# Patient Record
Sex: Female | Born: 1965 | Race: White | Hispanic: No | Marital: Married | Smoking: Never smoker
Health system: Southern US, Community
[De-identification: ages and names within clinical notes are randomized; demographics above are authoritative.]

## PROBLEM LIST (undated history)

## (undated) DIAGNOSIS — H9319 Tinnitus, unspecified ear: Secondary | ICD-10-CM

## (undated) DIAGNOSIS — H9190 Unspecified hearing loss, unspecified ear: Secondary | ICD-10-CM

## (undated) DIAGNOSIS — R519 Headache, unspecified: Secondary | ICD-10-CM

## (undated) DIAGNOSIS — C801 Malignant (primary) neoplasm, unspecified: Secondary | ICD-10-CM

## (undated) DIAGNOSIS — E119 Type 2 diabetes mellitus without complications: Secondary | ICD-10-CM

## (undated) DIAGNOSIS — T7840XA Allergy, unspecified, initial encounter: Secondary | ICD-10-CM

## (undated) DIAGNOSIS — H8109 Meniere's disease, unspecified ear: Secondary | ICD-10-CM

## (undated) DIAGNOSIS — E669 Obesity, unspecified: Secondary | ICD-10-CM

## (undated) DIAGNOSIS — K589 Irritable bowel syndrome without diarrhea: Secondary | ICD-10-CM

## (undated) DIAGNOSIS — N816 Rectocele: Secondary | ICD-10-CM

## (undated) DIAGNOSIS — E785 Hyperlipidemia, unspecified: Secondary | ICD-10-CM

## (undated) HISTORY — DX: Obesity, unspecified: E66.9

## (undated) HISTORY — DX: Hyperlipidemia, unspecified: E78.5

## (undated) HISTORY — PX: LAPAROSCOPY FOR ECTOPIC PREGNANCY: SUR765

## (undated) HISTORY — DX: Allergy, unspecified, initial encounter: T78.40XA

## (undated) HISTORY — PX: OTHER SURGICAL HISTORY: SHX169

## (undated) HISTORY — DX: Malignant (primary) neoplasm, unspecified: C80.1

## (undated) HISTORY — DX: Type 2 diabetes mellitus without complications: E11.9

---

## 2004-04-22 ENCOUNTER — Other Ambulatory Visit: Admission: RE | Admit: 2004-04-22 | Discharge: 2004-04-22 | Payer: Self-pay | Admitting: Obstetrics and Gynecology

## 2005-05-25 ENCOUNTER — Observation Stay (HOSPITAL_COMMUNITY): Admission: AD | Admit: 2005-05-25 | Discharge: 2005-05-26 | Payer: Self-pay | Admitting: Obstetrics & Gynecology

## 2005-05-25 ENCOUNTER — Encounter (INDEPENDENT_AMBULATORY_CARE_PROVIDER_SITE_OTHER): Payer: Self-pay | Admitting: Specialist

## 2005-06-15 ENCOUNTER — Inpatient Hospital Stay (HOSPITAL_COMMUNITY): Admission: AD | Admit: 2005-06-15 | Discharge: 2005-06-15 | Payer: Self-pay | Admitting: Obstetrics and Gynecology

## 2005-06-18 ENCOUNTER — Inpatient Hospital Stay (HOSPITAL_COMMUNITY): Admission: AD | Admit: 2005-06-18 | Discharge: 2005-06-18 | Payer: Self-pay | Admitting: Obstetrics and Gynecology

## 2005-06-21 ENCOUNTER — Inpatient Hospital Stay (HOSPITAL_COMMUNITY): Admission: AD | Admit: 2005-06-21 | Discharge: 2005-06-21 | Payer: Self-pay | Admitting: Obstetrics & Gynecology

## 2005-06-28 ENCOUNTER — Inpatient Hospital Stay (HOSPITAL_COMMUNITY): Admission: AD | Admit: 2005-06-28 | Discharge: 2005-06-28 | Payer: Self-pay | Admitting: Obstetrics & Gynecology

## 2005-06-30 ENCOUNTER — Other Ambulatory Visit: Admission: RE | Admit: 2005-06-30 | Discharge: 2005-06-30 | Payer: Self-pay | Admitting: Obstetrics and Gynecology

## 2005-07-05 ENCOUNTER — Inpatient Hospital Stay (HOSPITAL_COMMUNITY): Admission: AD | Admit: 2005-07-05 | Discharge: 2005-07-05 | Payer: Self-pay | Admitting: Obstetrics & Gynecology

## 2008-05-24 ENCOUNTER — Inpatient Hospital Stay (HOSPITAL_COMMUNITY): Admission: AD | Admit: 2008-05-24 | Discharge: 2008-05-24 | Payer: Self-pay | Admitting: Obstetrics and Gynecology

## 2008-06-09 ENCOUNTER — Other Ambulatory Visit: Admission: RE | Admit: 2008-06-09 | Discharge: 2008-06-09 | Payer: Self-pay | Admitting: Family Medicine

## 2009-12-30 ENCOUNTER — Encounter: Admission: RE | Admit: 2009-12-30 | Discharge: 2009-12-30 | Payer: Self-pay | Admitting: Family Medicine

## 2010-08-09 LAB — CBC
HCT: 39.9 % (ref 36.0–46.0)
WBC: 7.1 10*3/uL (ref 4.0–10.5)

## 2010-08-09 LAB — URINALYSIS, ROUTINE W REFLEX MICROSCOPIC
Leukocytes, UA: NEGATIVE
Nitrite: NEGATIVE
Specific Gravity, Urine: 1.02 (ref 1.005–1.030)
pH: 6 (ref 5.0–8.0)

## 2010-08-09 LAB — URINE MICROSCOPIC-ADD ON

## 2010-08-09 LAB — COMPREHENSIVE METABOLIC PANEL
ALT: 13 U/L (ref 0–35)
Albumin: 4 g/dL (ref 3.5–5.2)
Alkaline Phosphatase: 55 U/L (ref 39–117)
CO2: 28 mEq/L (ref 19–32)
Calcium: 8.8 mg/dL (ref 8.4–10.5)
Chloride: 108 mEq/L (ref 96–112)
GFR calc Af Amer: >60 mL/min (ref >60)
GFR calc non Af Amer: >60 mL/min (ref >60)
Potassium: 3.7 mEq/L (ref 3.5–5.1)
Sodium: 139 mEq/L (ref 135–145)
Total Bilirubin: 0.5 mg/dL (ref 0.3–1.2)

## 2010-08-09 LAB — DIFFERENTIAL
Eosinophils Relative: 1 % (ref 0–5)
Lymphocytes Relative: 25 % (ref 12–46)
Monocytes Absolute: 0.4 10*3/uL (ref 0.1–1.0)
Neutro Abs: 4.8 10*3/uL (ref 1.7–7.7)

## 2010-08-09 LAB — POCT PREGNANCY, URINE: Preg Test, Ur: NEGATIVE

## 2010-09-10 NOTE — Op Note (Signed)
NAME:  Sara Briggs, Sara Briggs                 ACCOUNT NO.:  [id number]   MEDICAL RECORD NO.:  [id number]          PATIENT TYPE:  AMB   LOCATION:  [facility]                          FACILITY:  [facility]   PHYSICIAN:  Sara Briggs, M.D.   DATE OF BIRTH:  [birth year]   DATE OF PROCEDURE:  DATE OF DISCHARGE:                                 OPERATIVE REPORT   PREOPERATIVE DIAGNOSIS:  Suspected ruptured ectopic pregnancy.   POSTOPERATIVE DIAGNOSES:  1.  Suspected ruptured ectopic pregnancy.  2.. Hemorrhagic right corpus luteum cyst.   PROCEDURE:  1.  Laparoscopic drainage of hemoperitoneum.  2.  Right ovarian cystectomy.   SURGEON:  Sara Briggs, M.D.   ASSISTANT:  Sara Briggs. Sara Briggs, M.D.   ANESTHESIA:  General endotracheal, local with 0.25% Marcaine.   IV FLUIDS:  2375 mL Ringer's lactate.   ESTIMATED BLOOD LOSS:  500 mL  of hemoperitoneum.   URINE OUTPUT:  150 mL.   COMPLICATIONS:  None.   INDICATIONS FOR PROCEDURE:  The patient is a 45 year old gravida 3, para 2-0-  0-2 Caucasian female with last menstrual period April 30, 2005, status post  bilateral tubal ligation, who presented to her primary care Sara Briggs this  morning with severe right lower quadrant pain which radiated to her left  lower quadrant and upper abdomen.  The patient was seen and evaluated and  noted to have a hemoglobin of approximately 14.  There was a suspicion for  appendicitis, and the patient was sent to [facility] for an  abdominal CT scan which documented a normal appendix but no blood within the  peritoneal cavity which was attributed to a ruptured ovarian cyst.  The  patient presented back for followup at her primary care Sara Briggs's office  where she then at that time went on to have a quantitative beta HCG  performed to rule out an ectopic pregnancy.  The patient was noted to be  stable at this time and was sent home with a prescription for pain  medication.  The patient's beta HCG then returned at  2472, and the patient  was recalled to go to the [facility] immediately for evaluation and  treatment of a probable ruptured ectopic pregnancy.  The patient was  reporting continued pain in the abdomen and some dizziness from earlier that  day.  She declined any future childbearing.  The abdomen was noted to be  tender with guarding.  There was no evidence of rebound.  An immediate  transabdominal and transvaginal ultrasound was performed by radiology at the  patient's bedside in the maternity admissions area.  There was no evidence  of an intrauterine pregnancy.  There was hemoperitoneum which filled the  pelvis and extended to the diaphragm.  The patient's IV was started, blood  was drawn, and she was immediately consented for a laparoscopy with removal  of the ectopic pregnancy and possible bilateral salpingectomy.  After risks,  benefits, and alternatives were discussed with her.   At the time of laparoscopy, there was 500 mL of hemoperitoneum appreciated.  There was active bleeding noted from the  right adnexal region which appeared  to be mostly coming from the right ovary.  There is an extensive amount of  clot which was covering the right ovary and the right fallopian tube and  some of the clot appeared to be densely adherent to the right corpus luteum  cyst on this side which measured approximately 2 cm in diameter. There was  some clot which was also adherent to the end of the right fallopian tube.  The right fallopian tube appeared to have had a previous tubal ligation  performed very proximally although it could not be obvious if there was  complete disruption of the tube on this side.  The fallopian tube itself had  no distortion in its shape or size.  The left ovary and the left fallopian  tube demonstrated no evidence of ectopic pregnancy.  There was clear  interruption off the left fallopian tube consistent with prior tubal  ligation.  There was no evidence of any  adhesive disease in the abdomen nor  in the pelvis. The liver appeared to be normal.  The uterus was  unremarkable.   SPECIMENS:  The hemoperitoneum with suspected ectopic tissue was all sent to  pathology together. This was sent separately from the right ovarian corpus  luteum cyst.   DESCRIPTION OF PROCEDURE:  The patient was taken from the maternity  admissions area down to the operating room where general endotracheal  anesthesia was induced.  The patient was then placed in the dorsal lithotomy  position, and the abdomen and chest were sterilely prepped and draped. A  Foley catheter was placed inside the bladder.  A single-tooth tenaculum was  placed on the anterior cervical lip, and this was replaced with a Hulka  tenaculum.   The laparoscopy began by creating a 1 cm umbilical incision with a scalpel.  A 10 mm trocar was inserted directly into the peritoneal cavity.  The  laparoscope confirmed proper placement, and a pneumoperitoneum was achieved.  The patient was then placed in the Trendelenburg position.  A 10 mm right  lower quadrant incision was created with the scalpel and a 10/11 mm trocar  was placed under direct visualization of the laparoscope. The large 10 mm  Nezhat suction irrigator was then used to remove the hemoperitoneum from the  abdominal cavity.  A 5 mm incision was created in the left lower quadrant  and a 5 mm trocar was placed under direct visualization of laparoscope.  A  grasper was introduced to assist in evaluation of the right adnexal region.  The findings were as noted above.  There was active bleeding noted from the  right ovarian cyst and adherent clot to and possible ectopic tissue, and  this was extracted from the right ovary. The edges of the ovary were  cauterized.  Hemostasis was noted to be excellent.   The pelvis was irrigated and suctioned one final time, and the operative site was noted to be hemostatic.  The procedure was finished at this  time.  The left lower quadrant trocar was removed as was the right lower quadrant  trocar.  The abdominal wall sites were hemostatic.  The pneumoperitoneum was  then released, and the umbilical trocar and laparoscope were removed  simultaneously.   The right lower quadrant fascial incision was closed with figure-of-eight  sutures of 0  Vicryl.  All skin incisions were closed with subcuticular  sutures of 3-0 plain.  The incisions were injected locally with 0.25%  Marcaine.  Sterile  bandages were placed over the incisions.   The patient was cleansed of any remaining Betadine.  The vaginal instruments  and the Foley were removed.  The patient was awakened and extubated and  escorted to the recovery room in stable and awake condition.  There were no  complications to the procedure.  All needle, instrument and , sponge counts  were correct.      Sara Briggs, M.D.  Electronically Signed     BES/MEDQ  D:  05/25/2005  T:  05/25/2005  Job:  [id number]

## 2011-05-18 ENCOUNTER — Other Ambulatory Visit (HOSPITAL_COMMUNITY)
Admission: RE | Admit: 2011-05-18 | Discharge: 2011-05-18 | Disposition: A | Discharge status: Home / Self Care | Payer: 59 | Source: Ambulatory Visit | Attending: Family Medicine | Admitting: Family Medicine

## 2011-05-18 ENCOUNTER — Other Ambulatory Visit: Payer: Self-pay | Admitting: Family Medicine

## 2011-05-18 DIAGNOSIS — Z0000 Encounter for general adult medical examination without abnormal findings: Secondary | ICD-10-CM | POA: Insufficient documentation

## 2011-06-01 ENCOUNTER — Other Ambulatory Visit: Payer: Self-pay | Admitting: Obstetrics and Gynecology

## 2011-06-06 ENCOUNTER — Emergency Department (INDEPENDENT_AMBULATORY_CARE_PROVIDER_SITE_OTHER)
Admission: EM | Admit: 2011-06-06 | Discharge: 2011-06-06 | Disposition: A | Discharge status: Other Acute Inpatient Hospital | Payer: 59 | Source: Home / Self Care | Attending: Family Medicine | Admitting: Family Medicine

## 2011-06-06 ENCOUNTER — Encounter (HOSPITAL_COMMUNITY): Payer: Self-pay | Admitting: *Deleted

## 2011-06-06 DIAGNOSIS — S61011A Laceration without foreign body of right thumb without damage to nail, initial encounter: Secondary | ICD-10-CM

## 2011-06-06 DIAGNOSIS — S61209A Unspecified open wound of unspecified finger without damage to nail, initial encounter: Secondary | ICD-10-CM

## 2011-06-06 HISTORY — DX: Allergy, unspecified, initial encounter: T78.40XA

## 2011-06-06 NOTE — ED Notes (Signed)
Lac   To   r  Thumb      Sustained  While  Opening      A  Can of  Dog  Food      This  Am      Bleeding  Subsided

## 2011-06-06 NOTE — ED Notes (Signed)
tdap   10/2004

## 2011-06-10 NOTE — ED Provider Notes (Signed)
History     CSN: [id number]  Arrival date & time 06/06/11  1453   First MD Initiated Contact with Patient 06/06/11 1632      Chief Complaint  Patient presents with  . Laceration    (Consider location/radiation/quality/duration/timing/severity/associated sxs/prior treatment) HPI Comments: 46 y/o right handed female h/o allergic rhinitis here c/o cut in right thumb just prior arrival was opening a can of dog food accidentally cutting her thumb. Able to move thumb with no difficulty, bleeding has self resolved. Pt works as Engineer, civil (consulting), tetanus vaccine was found to be up to date.    Past Medical History  Diagnosis Date  . Allergic     Past Surgical History  Procedure Date  . Btl   . Laparoscopy for ectopic pregnancy     Family History  Problem Relation Age of Onset  . Diabetes Mother   . Hypertension Mother   . Hypertension Father     History  Substance Use Topics  . Smoking status: Not on file  . Smokeless tobacco: Not on file  . Alcohol Use:     OB History    Grav Para Term Preterm Abortions TAB SAB Ect Mult Living                  Review of Systems  All other systems reviewed and are negative.    Allergies  Percocet; Sulfa antibiotics; and Vicodin  Home Medications   Current Outpatient Rx  Name Route Sig Dispense Refill  . CETIRIZINE HCL 10 MG PO TABS Oral Take 10 mg by mouth daily.    Marland Kitchen FLUTICASONE PROPIONATE 50 MCG/ACT NA SUSP Nasal Place 2 sprays into the nose daily.      BP 114/81  Pulse 84  Temp(Src) 97.8 F (36.6 C) (Oral)  Resp 14  SpO2 97%  Physical Exam  Nursing note and vitals reviewed. Constitutional: She is oriented to person, place, and time. She appears well-developed and well-nourished. No distress.  Musculoskeletal:       Right hand: normal ROM of digits including 1st digit with normal active and passive flexion and extension of DIPJ and PIPJ. Rest of hand exam normal.  Complete right hand neurovascularly intact.   Neurological:  She is alert and oriented to person, place, and time.  Skin:       Vertical superficial linear clean hemostatic laceration at middle of 1st right distal phalange palmar side. No swelling or erythema.     ED Course  LACERATION REPAIR Performed by: Sharin Grave Authorized by: Sharin Grave Consent: Verbal consent obtained. Consent given by: patient Patient understanding: patient states understanding of the procedure being performed Location: right thumb. Tendon involvement: none Irrigation method: cleaning solution. Amount of cleaning: standard Degree of undermining: none Skin closure: glue Approximation: close Approximation difficulty: simple Dressing: no adhesive with dry dressing on top. Patient tolerance: Patient tolerated the procedure well with no immediate complications.   (including critical care time)  Labs Reviewed - No data to display No results found.   1. Laceration of right thumb       MDM  Superficial clean, did nor require sutures; repaired with derma bond. Regular wound care.  Although TD up todate Reccommended to get TDaP at occupational health.        Sharin Grave, MD 06/10/11 [phone]

## 2011-06-22 ENCOUNTER — Encounter: Payer: Self-pay | Admitting: Internal Medicine

## 2011-07-27 ENCOUNTER — Ambulatory Visit (AMBULATORY_SURGERY_CENTER): Payer: 59

## 2011-07-27 VITALS — Ht 63.0 in | Wt 185.0 lb

## 2011-07-27 DIAGNOSIS — Z800 Family history of malignant neoplasm of digestive organs: Secondary | ICD-10-CM

## 2011-07-27 DIAGNOSIS — Z1211 Encounter for screening for malignant neoplasm of colon: Secondary | ICD-10-CM

## 2011-07-27 MED ORDER — PEG-KCL-NACL-NASULF-NA ASC-C 100 G PO SOLR: 1.0000 | Freq: Once | ORAL | Status: DC

## 2011-08-10 ENCOUNTER — Telehealth: Payer: Self-pay

## 2011-08-10 ENCOUNTER — Encounter: Payer: 59 | Admitting: Internal Medicine

## 2011-08-10 NOTE — Telephone Encounter (Signed)
She should continue the prep but can wait an hour before resuming

## 2011-08-10 NOTE — Telephone Encounter (Signed)
Pt states she had some diarrhea today, has hx of IBS. Started her colon prep today a little early trying to get it in. Pt states she is about half way through the 1st dose of moviprep but she is getting nauseated. States her bowel movements are already clear. Pt wants to know if she can stop the prep. Colon scheduled for 08/11/11@9am . Dr. Arlyce Dice please advise.

## 2011-08-11 ENCOUNTER — Ambulatory Visit (AMBULATORY_SURGERY_CENTER): Payer: 59 | Admitting: Gastroenterology

## 2011-08-11 ENCOUNTER — Encounter: Payer: Self-pay | Admitting: Gastroenterology

## 2011-08-11 VITALS — BP 107/70 | HR 57 | Temp 96.8°F | Resp 18 | Ht 63.0 in | Wt 185.0 lb

## 2011-08-11 DIAGNOSIS — Z1211 Encounter for screening for malignant neoplasm of colon: Secondary | ICD-10-CM

## 2011-08-11 DIAGNOSIS — Z800 Family history of malignant neoplasm of digestive organs: Secondary | ICD-10-CM

## 2011-08-11 MED ORDER — SODIUM CHLORIDE 0.9 % IV SOLN: 500.0000 mL | INTRAVENOUS | Status: DC

## 2011-08-11 NOTE — Progress Notes (Signed)
Patient did not experience any of the following events: a burn prior to discharge; a fall within the facility; wrong site/side/patient/procedure/implant event; or a hospital transfer or hospital admission upon discharge from the facility. (G8907) Patient did not have preoperative order for IV antibiotic SSI prophylaxis. (G8918)  

## 2011-08-11 NOTE — Telephone Encounter (Signed)
Pt was able to complete her prep with no further problems.

## 2011-08-11 NOTE — Op Note (Signed)
[facility] [address] [place], [place]  [address]  COLONOSCOPY PROCEDURE REPORT  PATIENT:  Sara Briggs, Sara Briggs  MR#:  [id number] BIRTHDATE:  1965-09-28, 45 yrs. old  GENDER:  female ENDOSCOPIST:  Barbette Hair. Arlyce Dice, MD REF. BY:  Laurann Montana, M.D. PROCEDURE DATE:  08/11/2011 PROCEDURE:  Diagnostic Colonoscopy ASA CLASS:  Class I INDICATIONS:  Routine Risk Screening, family history of colon cancer Grandfather MEDICATIONS:   MAC sedation, administered by CRNA propofol 300mg IV  DESCRIPTION OF PROCEDURE:   After the risks benefits and alternatives of the procedure were thoroughly explained, informed consent was obtained.  Digital rectal exam was performed and revealed no abnormalities.   The LB PCF-H180AL [id number] endoscope was introduced through the anus and advanced to the cecum, which was identified by both the appendix and ileocecal valve, without limitations.  The quality of the prep was excellent, using MoviPrep.  The instrument was then slowly withdrawn as the colon was fully examined. <<PROCEDUREIMAGES>>  FINDINGS:  A normal appearing cecum, ileocecal valve, and appendiceal orifice were identified. The ascending, hepatic flexure, transverse, splenic flexure, descending, sigmoid colon, and rectum appeared unremarkable (see image2, image3, and image4). Retroflexed views in the rectum revealed no abnormalities.    The time to cecum =  1) 9.0  minutes. The scope was then withdrawn in 1) 7.0  minutes from the cecum and the procedure completed. COMPLICATIONS:  None ENDOSCOPIC IMPRESSION: 1) Normal colon RECOMMENDATIONS: 1) OP follow-up is advised on a PRN basis. REPEAT EXAM:  In 10 year(s) for Colonoscopy.  ______________________________ Barbette Hair. Arlyce Dice, MD  CC:  n. eSIGNED:   Barbette Hair. Kayron Hicklin at 08/11/2011 09:29 AM  Maudie Flakes, [phone]

## 2011-08-11 NOTE — Patient Instructions (Signed)
Discharge instructions given with verbal understanding. Normal exam. Resume previous medications. YOU HAD AN ENDOSCOPIC PROCEDURE TODAY AT THE [facility]: Refer to the procedure report that was given to you for any specific questions about what was found during the examination.  If the procedure report does not answer your questions, please call your gastroenterologist to clarify.  If you requested that your care partner not be given the details of your procedure findings, then the procedure report has been included in a sealed envelope for you to review at your convenience later.  YOU SHOULD EXPECT: Some feelings of bloating in the abdomen. Passage of more gas than usual.  Walking can help get rid of the air that was put into your GI tract during the procedure and reduce the bloating. If you had a lower endoscopy (such as a colonoscopy or flexible sigmoidoscopy) you may notice spotting of blood in your stool or on the toilet paper. If you underwent a bowel prep for your procedure, then you may not have a normal bowel movement for a few days.  DIET: Your first meal following the procedure should be a light meal and then it is ok to progress to your normal diet.  A half-sandwich or bowl of soup is an example of a good first meal.  Heavy or fried foods are harder to digest and may make you feel nauseous or bloated.  Likewise meals heavy in dairy and vegetables can cause extra gas to form and this can also increase the bloating.  Drink plenty of fluids but you should avoid alcoholic beverages for 24 hours.  ACTIVITY: Your care partner should take you home directly after the procedure.  You should plan to take it easy, moving slowly for the rest of the day.  You can resume normal activity the day after the procedure however you should NOT DRIVE or use heavy machinery for 24 hours (because of the sedation medicines used during the test).    SYMPTOMS TO REPORT IMMEDIATELY: A gastroenterologist  can be reached at any hour.  During normal business hours, 8:30 AM to 5:00 PM Monday through Friday, call [phone].  After hours and on weekends, please call the GI answering service at [phone] who will take a message and have the physician on call contact you.   Following lower endoscopy (colonoscopy or flexible sigmoidoscopy):  Excessive amounts of blood in the stool  Significant tenderness or worsening of abdominal pains  Swelling of the abdomen that is new, acute  Fever of 100F or higher  FOLLOW UP: If any biopsies were taken you will be contacted by phone or by letter within the next 1-3 weeks.  Call your gastroenterologist if you have not heard about the biopsies in 3 weeks.  Our staff will call the home number listed on your records the next business day following your procedure to check on you and address any questions or concerns that you may have at that time regarding the information given to you following your procedure. This is a courtesy call and so if there is no answer at the home number and we have not heard from you through the emergency physician on call, we will assume that you have returned to your regular daily activities without incident.  SIGNATURES/CONFIDENTIALITY: You and/or your care partner have signed paperwork which will be entered into your electronic medical record.  These signatures attest to the fact that that the information above on your After Visit Summary has been reviewed   and is understood.  Full responsibility of the confidentiality of this discharge information lies with you and/or your care-partner. 

## 2011-08-12 ENCOUNTER — Telehealth: Payer: Self-pay

## 2011-08-12 NOTE — Telephone Encounter (Signed)
  Follow up Call-  Call back number 08/11/2011  Post procedure Call Back phone  # [phone]  Permission to leave phone message Yes     Patient questions:  Do you have a fever, pain , or abdominal swelling? no Pain Score  0 *  Have you tolerated food without any problems? yes  Have you been able to return to your normal activities? yes  Do you have any questions about your discharge instructions: Diet   no Medications  no Follow up visit  no  Do you have questions or concerns about your Care? no  Actions: * If pain score is 4 or above: No action needed, pain <4.

## 2012-07-20 ENCOUNTER — Other Ambulatory Visit: Payer: Self-pay

## 2012-07-20 DIAGNOSIS — Z1231 Encounter for screening mammogram for malignant neoplasm of breast: Secondary | ICD-10-CM

## 2012-08-29 ENCOUNTER — Ambulatory Visit
Admission: RE | Admit: 2012-08-29 | Discharge: 2012-08-29 | Disposition: A | Discharge status: Home / Self Care | Payer: 59 | Source: Ambulatory Visit

## 2012-08-29 DIAGNOSIS — Z1231 Encounter for screening mammogram for malignant neoplasm of breast: Secondary | ICD-10-CM

## 2012-10-15 ENCOUNTER — Emergency Department (HOSPITAL_COMMUNITY)
Admission: EM | Admit: 2012-10-15 | Discharge: 2012-10-15 | Disposition: A | Discharge status: Home / Self Care | Payer: 59 | Attending: Emergency Medicine | Admitting: Emergency Medicine

## 2012-10-15 ENCOUNTER — Encounter (HOSPITAL_COMMUNITY): Payer: Self-pay | Admitting: *Deleted

## 2012-10-15 ENCOUNTER — Emergency Department (HOSPITAL_COMMUNITY): Payer: 59

## 2012-10-15 DIAGNOSIS — IMO0002 Reserved for concepts with insufficient information to code with codable children: Secondary | ICD-10-CM | POA: Insufficient documentation

## 2012-10-15 DIAGNOSIS — Z8719 Personal history of other diseases of the digestive system: Secondary | ICD-10-CM | POA: Insufficient documentation

## 2012-10-15 DIAGNOSIS — Z79899 Other long term (current) drug therapy: Secondary | ICD-10-CM | POA: Insufficient documentation

## 2012-10-15 DIAGNOSIS — Z3202 Encounter for pregnancy test, result negative: Secondary | ICD-10-CM | POA: Insufficient documentation

## 2012-10-15 DIAGNOSIS — R109 Unspecified abdominal pain: Secondary | ICD-10-CM | POA: Insufficient documentation

## 2012-10-15 HISTORY — DX: Rectocele: N81.6

## 2012-10-15 HISTORY — DX: Irritable bowel syndrome, unspecified: K58.9

## 2012-10-15 LAB — CBC WITH DIFFERENTIAL/PLATELET
Eosinophils Absolute: 0.2 10*3/uL (ref 0.0–0.7)
Lymphocytes Relative: 33 % (ref 12–46)
Lymphs Abs: 2.6 10*3/uL (ref 0.7–4.0)
Neutrophils Relative %: 57 % (ref 43–77)
Platelets: 274 10*3/uL (ref 150–400)
RBC: 4.12 MIL/uL (ref 3.87–5.11)
WBC: 7.9 10*3/uL (ref 4.0–10.5)

## 2012-10-15 LAB — URINALYSIS, ROUTINE W REFLEX MICROSCOPIC
Bilirubin Urine: NEGATIVE
Glucose, UA: NEGATIVE mg/dL
Hgb urine dipstick: NEGATIVE
Ketones, ur: NEGATIVE mg/dL
Leukocytes, UA: NEGATIVE
Nitrite: NEGATIVE
Protein, ur: NEGATIVE mg/dL
Specific Gravity, Urine: 1.01 (ref 1.005–1.030)
Urobilinogen, UA: 0.2 mg/dL (ref 0.0–1.0)
pH: 6 (ref 5.0–8.0)

## 2012-10-15 LAB — COMPREHENSIVE METABOLIC PANEL
ALT: 15 U/L (ref 0–35)
AST: 18 U/L (ref 0–37)
Alkaline Phosphatase: 71 U/L (ref 39–117)
CO2: 26 mEq/L (ref 19–32)
GFR calc Af Amer: >90 mL/min (ref >90)
Glucose, Bld: 111 mg/dL — ABNORMAL HIGH (ref 70–99)
Potassium: 3.8 mEq/L (ref 3.5–5.1)
Sodium: 138 mEq/L (ref 135–145)
Total Protein: 7.3 g/dL (ref 6.0–8.3)

## 2012-10-15 MED ORDER — ONDANSETRON 4 MG PO TBDP: ORAL_TABLET | ORAL | Status: AC

## 2012-10-15 MED ORDER — SODIUM CHLORIDE 0.9 % IV SOLN
Freq: Once | INTRAVENOUS | Status: AC
  Administered 2012-10-15: 03:00:00 via INTRAVENOUS

## 2012-10-15 MED ORDER — TRAMADOL HCL 50 MG PO TABS: 50.0000 mg | ORAL_TABLET | Freq: Four times a day (QID) | ORAL | Status: DC | PRN

## 2012-10-15 MED ORDER — ONDANSETRON HCL 4 MG/2ML IJ SOLN
4.0000 mg | Freq: Once | INTRAMUSCULAR | Status: AC
  Administered 2012-10-15: 4 mg via INTRAVENOUS
  Filled 2012-10-15: qty 2

## 2012-10-15 MED ORDER — PROMETHAZINE HCL 25 MG PO TABS: 25.0000 mg | ORAL_TABLET | Freq: Four times a day (QID) | ORAL | Status: DC | PRN

## 2012-10-15 MED ORDER — IOHEXOL 300 MG/ML  SOLN
100.0000 mL | Freq: Once | INTRAMUSCULAR | Status: AC | PRN
  Administered 2012-10-15: 100 mL via INTRAVENOUS

## 2012-10-15 MED ORDER — HYDROMORPHONE HCL PF 1 MG/ML IJ SOLN
0.5000 mg | Freq: Once | INTRAMUSCULAR | Status: AC
  Administered 2012-10-15: 0.5 mg via INTRAVENOUS
  Filled 2012-10-15: qty 1

## 2012-10-15 MED ORDER — HYDROCODONE-ACETAMINOPHEN 5-325 MG PO TABS: 1.0000 | ORAL_TABLET | Freq: Four times a day (QID) | ORAL | Status: DC | PRN

## 2012-10-15 MED ORDER — OXYCODONE-ACETAMINOPHEN 5-325 MG PO TABS: 2.0000 | ORAL_TABLET | Freq: Four times a day (QID) | ORAL | Status: DC | PRN

## 2012-10-15 MED ORDER — IOHEXOL 300 MG/ML  SOLN
50.0000 mL | Freq: Once | INTRAMUSCULAR | Status: AC | PRN
  Administered 2012-10-15: 50 mL via ORAL

## 2012-10-15 NOTE — ED Provider Notes (Signed)
History     CSN: [id number]  Arrival date & time 10/15/12  0139   First MD Initiated Contact with Patient 10/15/12 0243      Chief Complaint  Patient presents with  . Flank Pain    (Consider location/radiation/quality/duration/timing/severity/associated sxs/prior treatment) Patient is a 47 y.o. female presenting with flank pain. The history is provided by the patient (pt complains of abd pain). No language interpreter was used.  Flank Pain This is a new problem. The current episode started 12 to 24 hours ago. The problem occurs constantly. The problem has not changed since onset.Associated symptoms include abdominal pain. Pertinent negatives include no chest pain and no headaches. Nothing aggravates the symptoms. Nothing relieves the symptoms.    Past Medical History  Diagnosis Date  . Allergic   . Rectocele   . IBS (irritable bowel syndrome)     Past Surgical History  Procedure Laterality Date  . Btl    . Laparoscopy for ectopic pregnancy      Family History  Problem Relation Age of Onset  . Diabetes Mother   . Hypertension Mother   . Hypertension Father   . Colon cancer Maternal Uncle   . Colon cancer Maternal Grandfather     History  Substance Use Topics  . Smoking status: Never Smoker   . Smokeless tobacco: Never Used  . Alcohol Use: No    OB History   Grav Para Term Preterm Abortions TAB SAB Ect Mult Living                  Review of Systems  Constitutional: Negative for appetite change and fatigue.  HENT: Negative for congestion, sinus pressure and ear discharge.   Eyes: Negative for discharge.  Respiratory: Negative for cough.   Cardiovascular: Negative for chest pain.  Gastrointestinal: Positive for abdominal pain. Negative for diarrhea.  Genitourinary: Positive for flank pain. Negative for frequency and hematuria.  Musculoskeletal: Negative for back pain.  Skin: Negative for rash.  Neurological: Negative for seizures and headaches.   Psychiatric/Behavioral: Negative for hallucinations.    Allergies  Apple; Tape; Percocet; Sulfa antibiotics; and Vicodin  Home Medications   Current Outpatient Rx  Name  Route  Sig  Dispense  Refill  . acetaminophen (TYLENOL) 500 MG tablet   Oral   Take 500 mg by mouth every 6 (six) hours as needed for pain.         . cetirizine (ZYRTEC) 10 MG tablet   Oral   Take 10 mg by mouth every evening.          . fluticasone (FLONASE) 50 MCG/ACT nasal spray   Nasal   Place 2 sprays into the nose daily.         Marland Kitchen ibuprofen (ADVIL,MOTRIN) 200 MG tablet   Oral   Take 400 mg by mouth every 6 (six) hours as needed for pain.         . naproxen (NAPROSYN) 500 MG tablet   Oral   Take 500 mg by mouth 2 (two) times daily as needed (for pain).         . simethicone (MYLICON) 80 MG chewable tablet   Oral   Chew 80 mg by mouth every 6 (six) hours as needed for flatulence.         Marland Kitchen HYDROcodone-acetaminophen (NORCO/VICODIN) 5-325 MG per tablet   Oral   Take 1 tablet by mouth every 6 (six) hours as needed for pain.   20 tablet   0   .  promethazine (PHENERGAN) 25 MG tablet   Oral   Take 1 tablet (25 mg total) by mouth every 6 (six) hours as needed for nausea.   15 tablet   0     BP 102/61  Pulse 60  Temp(Src) 97.6 F (36.4 C) (Oral)  Resp 20  Wt 186 lb (84.369 kg)  BMI 32.96 kg/m2  SpO2 99%  LMP 10/01/2012  Physical Exam  Constitutional: She is oriented to person, place, and time. She appears well-developed.  HENT:  Head: Normocephalic.  Eyes: Conjunctivae and EOM are normal. No scleral icterus.  Neck: Neck supple. No thyromegaly present.  Cardiovascular: Normal rate and regular rhythm.  Exam reveals no gallop and no friction rub.   No murmur heard. Pulmonary/Chest: No stridor. She has no wheezes. She has no rales. She exhibits no tenderness.  Abdominal: She exhibits no distension. There is tenderness. There is no rebound.  Tender rlq  Musculoskeletal: Normal  range of motion. She exhibits no edema.  Lymphadenopathy:    She has no cervical adenopathy.  Neurological: She is oriented to person, place, and time. Coordination normal.  Skin: No rash noted. No erythema.  Psychiatric: She has a normal mood and affect. Her behavior is normal.    ED Course  Procedures (including critical care time)  Labs Reviewed  COMPREHENSIVE METABOLIC PANEL - Abnormal; Notable for the following:    Glucose, Bld 111 (*)    Total Bilirubin 0.2 (*)    All other components within normal limits  URINALYSIS, ROUTINE W REFLEX MICROSCOPIC  CBC WITH DIFFERENTIAL  PREGNANCY, URINE   Ct Abdomen Pelvis W Contrast  10/15/2012   *RADIOLOGY REPORT*  Clinical Data: Right lower quadrant pain.  CT ABDOMEN AND PELVIS WITH CONTRAST  Technique:  Multidetector CT imaging of the abdomen and pelvis was performed following the standard protocol during bolus administration of intravenous contrast.  Contrast: OMNIPAQUE IOHEXOL 300 MG/ML  SOLN  Comparison: CT 05/25/2005  Findings: Minimal dependent atelectasis.  Heart is normal size.  No effusions.  Gallstones noted within the gallbladder.  Liver, spleen, pancreas, adrenals and kidneys are normal.  Appendix is visualized and is normal.  Uterus, adnexa urinary bladder unremarkable. Bowel grossly unremarkable.  No free fluid, free air, or adenopathy.  No acute bony abnormality.  IMPRESSION: Cholelithiasis.  No acute findings.  Normal appendix.   Original Report Authenticated By: Charlett Nose, M.D.     1. Abdominal  pain, other specified site       MDM          Benny Lennert, MD 10/15/12 [phone]

## 2012-10-15 NOTE — ED Notes (Signed)
States she has taken Tramadol, Naproxen, Ibuprofen, Toradol. She states she breaks out in a rash all over body.

## 2012-10-15 NOTE — ED Notes (Signed)
Patient transported to CT 

## 2012-10-15 NOTE — ED Notes (Signed)
Pt developed RLQ pain around 10 pm that has become worse

## 2012-10-17 ENCOUNTER — Other Ambulatory Visit (HOSPITAL_COMMUNITY): Payer: Self-pay | Admitting: Family Medicine

## 2012-10-17 DIAGNOSIS — N83209 Unspecified ovarian cyst, unspecified side: Secondary | ICD-10-CM

## 2012-10-17 DIAGNOSIS — R1031 Right lower quadrant pain: Secondary | ICD-10-CM

## 2012-10-23 ENCOUNTER — Ambulatory Visit (HOSPITAL_COMMUNITY)
Admission: RE | Admit: 2012-10-23 | Discharge: 2012-10-23 | Disposition: A | Discharge status: Home / Self Care | Payer: 59 | Source: Ambulatory Visit | Attending: Family Medicine | Admitting: Family Medicine

## 2012-10-23 DIAGNOSIS — R1031 Right lower quadrant pain: Secondary | ICD-10-CM | POA: Insufficient documentation

## 2012-10-23 DIAGNOSIS — N83209 Unspecified ovarian cyst, unspecified side: Secondary | ICD-10-CM

## 2012-10-23 DIAGNOSIS — N949 Unspecified condition associated with female genital organs and menstrual cycle: Secondary | ICD-10-CM | POA: Insufficient documentation

## 2012-10-23 DIAGNOSIS — N938 Other specified abnormal uterine and vaginal bleeding: Secondary | ICD-10-CM | POA: Insufficient documentation

## 2012-10-24 ENCOUNTER — Ambulatory Visit (HOSPITAL_COMMUNITY): Payer: 59

## 2013-10-02 ENCOUNTER — Other Ambulatory Visit (HOSPITAL_COMMUNITY)
Admission: RE | Admit: 2013-10-02 | Discharge: 2013-10-02 | Disposition: A | Discharge status: Home / Self Care | Payer: 59 | Source: Ambulatory Visit | Attending: Family Medicine | Admitting: Family Medicine

## 2013-10-02 ENCOUNTER — Other Ambulatory Visit: Payer: Self-pay | Admitting: Family Medicine

## 2013-10-02 DIAGNOSIS — Z0000 Encounter for general adult medical examination without abnormal findings: Secondary | ICD-10-CM | POA: Insufficient documentation

## 2013-10-03 LAB — CYTOLOGY - PAP

## 2013-10-16 ENCOUNTER — Other Ambulatory Visit: Payer: Self-pay

## 2013-10-16 DIAGNOSIS — Z1231 Encounter for screening mammogram for malignant neoplasm of breast: Secondary | ICD-10-CM

## 2013-11-01 ENCOUNTER — Encounter: Payer: 59 | Attending: Family Medicine | Admitting: Dietician

## 2013-11-01 ENCOUNTER — Encounter: Payer: Self-pay | Admitting: Dietician

## 2013-11-01 VITALS — Ht 63.0 in | Wt 194.0 lb

## 2013-11-01 DIAGNOSIS — E119 Type 2 diabetes mellitus without complications: Secondary | ICD-10-CM | POA: Diagnosis not present

## 2013-11-01 DIAGNOSIS — Z713 Dietary counseling and surveillance: Secondary | ICD-10-CM | POA: Insufficient documentation

## 2013-11-01 NOTE — Patient Instructions (Signed)
Goals:  Follow Diabetes Meal Plan as instructed  Eat 3 meals and 2 snacks, every 3-5 hrs  Limit carbohydrate intake to 30-45 grams carbohydrate/meal  Limit carbohydrate intake to 15-30 grams carbohydrate/snack  Add lean protein foods to meals/snacks  Monitor glucose levels as instructed by your doctor  Aim for 45-60 mins of physical activity 5 to 7 days a week  Bring food record and glucose log to your next nutrition visit

## 2013-11-01 NOTE — Progress Notes (Signed)
  Medical Nutrition Therapy:  Appt start time: 1000 end time:  1100.   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  She is recently diagnosed (June 2015) with HgA1c of 6.8%.  Sara Briggs is currently checking her BG 2-4 times a day and reports a BG range of 78-192.  She wants to learn more about managing her blood sugar with diet.  Sara Briggs states she is drinking more water, has started walking 3-4 times a week with her husband (3 miles at a time), watching what she eats, and is trying to lose weight. Sara Briggs works 3pm-11pm 2-3 days a week so supper times vary and sometimes she skips.   Preferred Learning Style all of the following:  Auditory  Visual  Hands on  Learning Readiness:  Change in progress  MEDICATIONS: see list. None for diabetes.   DIETARY INTAKE:  24-hr recall:  B ( AM): special K protein cereal, honey or yogurt with a banana  Snk ( AM): none L ( PM): sandwich or soup Snk ( PM): none 7 or 9:30-11pm D ( PM): may skip is working, meat and seasonal veggies or salad or baked potato (has recently cut back) Snk ( PM): 4 oz. Milk after exercise Beverages: water with lemon, sometimes diet drinks  Usual physical activity: walking 45 min (3 miles) 3-4 times a week  Progress Towards Goal(s):  In progress.   Nutritional Diagnosis:  NB-1.1 Food and nutrition-related knowledge deficit As related to prior knowledge needs refreshing and individualization to lifestyle .  As evidenced by patient request for diabetes management education for improving food habits.    Intervention:  Nutrition education on diabetes self management.    The following learning objectives were met by the patient during this visit:  Describe diabetes  State some common risk factors for diabetes  Defines the role of glucose and insulin  Identifies type of diabetes and  pathophysiology  Describe the relationship between diabetes and cardiovascular risk  State the members of the Healthcare Team  Describe how to interpret glucose readings and the effect of stress, food, exercise, and medications on BG  Identifies A1C target of 7% or less  State symptoms and treatment of high blood glucose  Explain how carbohydrates affect blood glucose  State what foods contain the most carbohydrates  Demonstrate how to read Nutrition Facts food label  Discuss weight loss techniques and describe how this is a tool to improve insulin resistance  Goals:  Follow Diabetes Meal Plan as instructed  Eat 3 meals and 2 snacks, every 3-5 hrs  Limit carbohydrate intake to 30-45 grams carbohydrate/meal  Limit carbohydrate intake to 15-30 grams carbohydrate/snack  Add lean protein foods to meals/snacks  Monitor glucose levels as instructed by your doctor  Aim for 45-60 mins of physical activity 5 to 7 days a week  Teaching Method Utilized all of the following: Visual Auditory Hands on  Handouts given during visit include:  Living Well with Diabetes book  Meal Plan Card  Low Carb Snacks  Barriers to learning/adherence to lifestyle change: none  Demonstrated degree of understanding via:  Teach Back   Monitoring/Evaluation:  Dietary intake, exercise, portion control, and body weight prn.

## 2013-11-04 ENCOUNTER — Ambulatory Visit
Admission: RE | Admit: 2013-11-04 | Discharge: 2013-11-04 | Disposition: A | Discharge status: Home / Self Care | Payer: 59 | Source: Ambulatory Visit

## 2013-11-04 DIAGNOSIS — Z1231 Encounter for screening mammogram for malignant neoplasm of breast: Secondary | ICD-10-CM

## 2013-12-26 ENCOUNTER — Ambulatory Visit (INDEPENDENT_AMBULATORY_CARE_PROVIDER_SITE_OTHER): Payer: 59 | Admitting: Podiatry

## 2013-12-26 ENCOUNTER — Ambulatory Visit (INDEPENDENT_AMBULATORY_CARE_PROVIDER_SITE_OTHER): Payer: 59

## 2013-12-26 ENCOUNTER — Encounter: Payer: Self-pay | Admitting: Podiatry

## 2013-12-26 VITALS — BP 100/64 | HR 68 | Resp 12

## 2013-12-26 DIAGNOSIS — R52 Pain, unspecified: Secondary | ICD-10-CM

## 2013-12-26 DIAGNOSIS — M722 Plantar fascial fibromatosis: Secondary | ICD-10-CM

## 2013-12-26 MED ORDER — DICLOFENAC SODIUM 75 MG PO TBEC
75.0000 mg | DELAYED_RELEASE_TABLET | Freq: Two times a day (BID) | ORAL | Status: DC
Start: 2013-12-26 — End: 2014-06-11

## 2013-12-26 MED ORDER — TRIAMCINOLONE ACETONIDE 10 MG/ML IJ SUSP: 10.0000 mg | Freq: Once | INTRAMUSCULAR | Status: DC

## 2013-12-26 NOTE — Patient Instructions (Signed)

## 2013-12-26 NOTE — Progress Notes (Signed)
Subjective:     Patient ID: Sara Briggs, female   DOB: 1965/08/05, 48 y.o.   MRN: [id number]  HPI patient presents stating I'm getting a lot of pain in my right heel and my left arch has also started to become inflamed. States it's been going on for around 4 months and gradually getting worse   Review of Systems  All other systems reviewed and are negative.      Objective:   Physical Exam  Nursing note and vitals reviewed. Constitutional: She is oriented to person, place, and time.  Cardiovascular: Intact distal pulses.   Musculoskeletal: Normal range of motion.  Neurological: She is oriented to person, place, and time. Cranial nerve deficit: and range of motion of the subtalar midtarsal joint within normal limits. Patient's found to have discomfort in the plantar aspect right heel at the insertion into the calcaneus and in the mid arch left where there is a.  Skin: Skin is warm.   neurovascular status was found to be intact with muscle strength adequate continuing from above a small nodule noted with pain. Patient's digits are well perfused and there is moderate diminishment of the arch     Assessment:     Plantar fasciitis of the heel right and small nodular formation possible plantar fibroma with inflammation of the mid arch left    Plan:     H&P and x-rays reviewed and today I injected the right plantar fascia 3 mg Kenalog 5 mg Xylocaine and I injected the left 3 mg Kenalog 5 mm Xylocaine to try to shrink the inflamed tissue and I did dispense fascial brace for the right and placed on diclofenac 75 mg twice a day. Reappoint in 1 week

## 2013-12-26 NOTE — Progress Notes (Signed)
   Subjective:    Patient ID: Sara Briggs, female    DOB: 1966/03/15, 48 y.o.   MRN: [id number]  HPI  PT STATED B/L HEEL AND ARCH ARE PAINFUL FOR 3 MONTHS. THE FEET ARE GETTING WORSE AND GETTING ANY BETTER. THE FEET GET AGGRAVATED BY WALKING/PRESSURE. TRIED TO WEAR INSERTS BUT NO HELP.  Review of Systems  Musculoskeletal: Positive for back pain.  Allergic/Immunologic: Positive for food allergies.  All other systems reviewed and are negative.      Objective:   Physical Exam        Assessment & Plan:

## 2014-01-02 ENCOUNTER — Encounter: Payer: Self-pay | Admitting: Podiatry

## 2014-01-02 ENCOUNTER — Ambulatory Visit (INDEPENDENT_AMBULATORY_CARE_PROVIDER_SITE_OTHER): Payer: 59 | Admitting: Podiatry

## 2014-01-02 VITALS — BP 101/53 | HR 68 | Resp 16

## 2014-01-02 DIAGNOSIS — M722 Plantar fascial fibromatosis: Secondary | ICD-10-CM

## 2014-01-02 NOTE — Progress Notes (Signed)
Subjective:     Patient ID: Sara Briggs, female   DOB: [birth year], 48 y.o.   MRN: [id number]  HPI patient presents with the continuation of heel pain right foot with symptoms still noticeable with activities and after sitting   Review of Systems     Objective:   Physical Exam Neurovascular status intact with muscle strength adequate and found to have discomfort plantar heel right at the insertional point of the tendon into the calcaneus    Assessment:     Plantar fasciitis still present right heel    Plan:     Advised on physical therapy supportive shoe and scanned for custom orthotics to reduce pressure against the heel. Reappoint when ready

## 2014-01-04 ENCOUNTER — Ambulatory Visit: Payer: 59

## 2014-01-28 ENCOUNTER — Other Ambulatory Visit: Payer: 59

## 2014-01-31 ENCOUNTER — Ambulatory Visit: Payer: 59 | Admitting: Podiatry

## 2014-01-31 DIAGNOSIS — M722 Plantar fascial fibromatosis: Secondary | ICD-10-CM

## 2014-01-31 NOTE — Patient Instructions (Signed)

## 2014-01-31 NOTE — Progress Notes (Signed)
Pt is here to PUO 

## 2014-02-01 ENCOUNTER — Ambulatory Visit: Payer: 59

## 2014-03-01 ENCOUNTER — Encounter: Payer: 59 | Attending: Family Medicine

## 2014-03-01 VITALS — Ht 63.0 in | Wt 184.8 lb

## 2014-03-01 DIAGNOSIS — E119 Type 2 diabetes mellitus without complications: Secondary | ICD-10-CM | POA: Diagnosis not present

## 2014-03-01 DIAGNOSIS — Z713 Dietary counseling and surveillance: Secondary | ICD-10-CM | POA: Insufficient documentation

## 2014-03-01 NOTE — Progress Notes (Signed)
Patient was seen on 03/01/14 for the complete diabetes self-management series at [facility]. This is a part of the [facility] [employer].  Handouts given during class include:  Living Well with Diabetes book  Carb Counting and Meal Planning book  Meal Plan Card  Carbohydrate guide  Meal planning worksheet  Low Sodium Flavoring Tips  The diabetes portion plate  Low Carbohydrate Snack Suggestions  A1c to eAG Conversion Chart  Diabetes Medications  Stress Management  Diabetes Recommended Care Schedule  Diabetes Success Plan  Core Class Satisfaction Survey  The following learning objectives were met by the patient during this course:  Describe diabetes  State some common risk factors for diabetes  Defines the role of glucose and insulin  Identifies type of diabetes and pathophysiology  Describe the relationship between diabetes and cardiovascular risk  State the members of the Healthcare Team  States the rationale for glucose monitoring  State when to test glucose  State their individual Target Range  State the importance of logging glucose readings  Describe how to interpret glucose readings  Identifies A1C target  Explain the correlation between A1c and eAG values  State symptoms and treatment of high blood glucose  State symptoms and treatment of low blood glucose  Explain proper technique for glucose testing  Identifies proper sharps disposal  Describe the role of different macronutrients on glucose  Explain how carbohydrates affect blood glucose  State what foods contain the most carbohydrates  Demonstrate carbohydrate counting  Demonstrate how to read Nutrition Facts food label  Describe effects of various fats on heart health  Describe the importance of good nutrition for health and healthy eating strategies  Describe techniques for managing your shopping, cooking and meal planning  List  strategies to follow meal plan when dining out  Describe the effects of alcohol on glucose and how to use it safely . State the amount of activity recommended for healthy living . Describe activities suitable for individual needs . Identify ways to regularly incorporate activity into daily life . Identify barriers to activity and ways to over come these barriers  Identify diabetes medications being personally used and their primary action for lowering glucose and possible side effects . Describe role of stress on blood glucose and develop strategies to address psychosocial issues . Identify diabetes complications and ways to prevent them  Explain how to manage diabetes during illness . Evaluate success in meeting personal goal . Establish 2-3 goals that they will plan to diligently work on until they return for the  78-monthfollow-up visit  Goals:   I will count my carb choices at most meals and snacks  I will be active 3 times a week  I will test my glucose at least 2 times a day, 5 days a week  Your patient has identified these potential barriers to change:  Motivation busy  Your patient has identified their diabetes self-care support plan as  [facility] Support   Plan[facility]

## 2014-03-13 ENCOUNTER — Telehealth: Payer: Self-pay | Admitting: *Deleted

## 2014-03-13 NOTE — Telephone Encounter (Signed)
Pt states the orthotic are rubbing a blister on her feet, please call.  Pt's voicemail requested a message, I left message encouraging pt to put powder in the shoes with the orthotic and wear cotton socks, not nylon if blister to the plantar feet.  If the blisters were elsewhere on her feet, it may be due to the way the orthotics and shoes are fitting and she may need to take the orthotics to purchase deeper or wider shoes.  I encouraged pt to call with concerns.

## 2014-03-17 ENCOUNTER — Ambulatory Visit (INDEPENDENT_AMBULATORY_CARE_PROVIDER_SITE_OTHER): Payer: 59 | Admitting: Podiatry

## 2014-03-17 ENCOUNTER — Encounter: Payer: Self-pay | Admitting: Podiatry

## 2014-03-17 VITALS — BP 108/55 | HR 69 | Resp 16

## 2014-03-17 DIAGNOSIS — M722 Plantar fascial fibromatosis: Secondary | ICD-10-CM

## 2014-03-17 MED ORDER — TRIAMCINOLONE ACETONIDE 10 MG/ML IJ SUSP
10.0000 mg | Freq: Once | INTRAMUSCULAR | Status: AC
  Administered 2014-03-17: 10 mg

## 2014-03-17 NOTE — Progress Notes (Signed)
Subjective:     Patient ID: Sara Briggs, female   DOB: 16-Sep-1965, 48 y.o.   MRN: [id number]  HPI patient states I'm having pain in the outside of my right foot over left foot and I've also had a recurrence of heel pain right with inflammation in the tendon that has gotten worse recently   Review of Systems     Objective:   Physical Exam Neurovascular status intact with discomfort in the plantar fascial of a moderate to severe nature and mild discomfort in the peroneal base right and slightly distal to this point    Assessment:     Probable reoccurrence of acute plantar fasciitis right causing change in gait leading to pain in the outside of the foot    Plan:     Instructed on physical therapy reinjected the plantar fascia right 3 mg Kenalog 5 mg Xylocaine and applied night splint with all instructions on usage. Begin ice therapy to the outside and placed on anti-inflammatories and reappoint again in 3 weeks

## 2014-04-07 ENCOUNTER — Ambulatory Visit: Payer: 59 | Admitting: Podiatry

## 2014-06-11 ENCOUNTER — Other Ambulatory Visit: Payer: Self-pay | Admitting: Podiatry

## 2014-06-25 ENCOUNTER — Encounter: Payer: Self-pay | Admitting: Podiatry

## 2014-06-25 ENCOUNTER — Ambulatory Visit (INDEPENDENT_AMBULATORY_CARE_PROVIDER_SITE_OTHER): Payer: 59 | Admitting: Podiatry

## 2014-06-25 VITALS — BP 106/63 | HR 78 | Resp 16

## 2014-06-25 DIAGNOSIS — M722 Plantar fascial fibromatosis: Secondary | ICD-10-CM | POA: Diagnosis not present

## 2014-06-26 NOTE — Progress Notes (Signed)
Subjective:     Patient ID: Sara Briggs, female   DOB: June 24, 1965, 49 y.o.   MRN: [id number]  HPI patient states that she still having a lot of pain in her right heel and that it just doesn't get better and that it makes it hard for her to do any types of activities   Review of Systems     Objective:   Physical Exam Neurovascular status intact with muscle strength adequate and range of motion within normal limits. Patient's noted to have severe discomfort right plantar heel at the insertional point of the tendon into the calcaneus with fluid buildup noted upon palpation    Assessment:     Severe plantar fasciitis right with inflammation noted medial band    Plan:     Reviewed conditions and all the different things that we have tried and the fact that this continues to be a persistent problem. Patient wants to get this fixed and I do think it would be in her best interest and I reviewed procedure to correct this and have recommended at this time shockwave therapy over open surgery as she cannot afford to miss work. Wants shockwave therapy is scheduled for this and was dispensed a air fracture walker at this time to try to reduce all stress against the heel and to use postoperatively

## 2014-07-01 ENCOUNTER — Ambulatory Visit (INDEPENDENT_AMBULATORY_CARE_PROVIDER_SITE_OTHER): Payer: 59 | Admitting: Podiatry

## 2014-07-01 ENCOUNTER — Encounter: Payer: Self-pay | Admitting: Podiatry

## 2014-07-01 VITALS — BP 106/53 | HR 77 | Resp 18

## 2014-07-01 DIAGNOSIS — M722 Plantar fascial fibromatosis: Secondary | ICD-10-CM

## 2014-07-01 DIAGNOSIS — M79673 Pain in unspecified foot: Secondary | ICD-10-CM

## 2014-07-01 NOTE — Progress Notes (Signed)
Subjective:     Patient ID: Sara Briggs, female   DOB: [birth year], 49 y.o.   MRN: [id number]  HPI patient states heel is still hurting   Review of Systems     Objective:   Physical Exam Neurovascular status intact with significant discomfort medial fascial band right    Assessment:     Plantar fasciitis right    Plan:     Administered 2500 shocks 16 frequency 3.8 on intensity

## 2014-07-08 ENCOUNTER — Encounter: Payer: Self-pay | Admitting: Podiatry

## 2014-07-08 ENCOUNTER — Ambulatory Visit (INDEPENDENT_AMBULATORY_CARE_PROVIDER_SITE_OTHER): Payer: 59 | Admitting: Podiatry

## 2014-07-08 VITALS — BP 105/66 | HR 100 | Resp 18

## 2014-07-08 DIAGNOSIS — M722 Plantar fascial fibromatosis: Secondary | ICD-10-CM

## 2014-07-08 NOTE — Progress Notes (Signed)
Subjective:     Patient ID: Sara Briggs, female   DOB: 17-Jan-1966, 49 y.o.   MRN: [id number]  HPI patient presents for shockwave #2 plantar aspect right heel   Review of Systems     Objective:   Physical Exam Continued discomfort is still noted upon palpation to the plantar heel with patient having used the boot sporadically in the past week    Assessment:     Continued plantar fasciitis right    Plan:     Shockwave #2 administered 2500 shocks 4.4 intensity at 16 frequency. 1000 shocks into the arch were also administered at 3.0

## 2014-07-08 NOTE — Progress Notes (Signed)
   Subjective:    Patient ID: Sara Briggs, female    DOB: June 26, 1965, 49 y.o.   MRN: [id number]  HPI  My right heel is still sore   Review of Systems     Objective:   Physical Exam        Assessment & Plan:

## 2014-07-16 ENCOUNTER — Ambulatory Visit: Payer: 59 | Admitting: Podiatry

## 2014-07-22 ENCOUNTER — Ambulatory Visit (INDEPENDENT_AMBULATORY_CARE_PROVIDER_SITE_OTHER): Payer: 59 | Admitting: Podiatry

## 2014-07-22 ENCOUNTER — Encounter: Payer: Self-pay | Admitting: Podiatry

## 2014-07-22 VITALS — BP 94/47 | HR 87 | Resp 18

## 2014-07-22 DIAGNOSIS — M722 Plantar fascial fibromatosis: Secondary | ICD-10-CM

## 2014-07-22 NOTE — Progress Notes (Deleted)
   Subjective:    Patient ID: Sara Briggs, female    DOB: 04/26/1965, 49 y.o.   MRN: [id number]  HPI    Review of Systems     Objective:   Physical Exam        Assessment & Plan:

## 2014-07-23 NOTE — Progress Notes (Signed)
Subjective:     Patient ID: Sara Briggs, female   DOB: [birth year], 49 y.o.   MRN: [id number]  HPI patient states my heel seems slowly better but still can hurt if I'm on it too long   Review of Systems     Objective:   Physical Exam Discomfort which seems to be moderating in the plantar medial right heel    Assessment:     Improving plantar fasciitis right    Plan:     Shockwave 2500 shocks 5.0 intensity 16 frequency

## 2014-08-19 ENCOUNTER — Ambulatory Visit: Payer: 59 | Admitting: Podiatry

## 2014-08-19 ENCOUNTER — Encounter: Payer: Self-pay | Admitting: Podiatry

## 2014-08-19 VITALS — BP 100/56 | HR 85 | Resp 12

## 2014-08-19 DIAGNOSIS — M722 Plantar fascial fibromatosis: Secondary | ICD-10-CM

## 2014-08-19 NOTE — Progress Notes (Signed)
Subjective:     Patient ID: Sara Briggs, female   DOB: 09/28/1965, 49 y.o.   MRN: [id number]  HPI patient states I'm improved   Review of Systems     Objective:   Physical Exam Neurovascular status intact with significant diminishment of discomfort right plantar heel with pain still noted upon deep palpation    Assessment:     Improving plantar fasciitis right    Plan:     Shockwave 2500 shocks 16 frequency 5.0 intensity

## 2014-09-28 ENCOUNTER — Ambulatory Visit (HOSPITAL_BASED_OUTPATIENT_CLINIC_OR_DEPARTMENT_OTHER): Payer: 59 | Attending: Family Medicine

## 2014-09-28 VITALS — Ht 63.0 in | Wt 190.0 lb

## 2014-09-28 DIAGNOSIS — G4733 Obstructive sleep apnea (adult) (pediatric): Secondary | ICD-10-CM | POA: Diagnosis not present

## 2014-09-28 DIAGNOSIS — G4710 Hypersomnia, unspecified: Secondary | ICD-10-CM | POA: Diagnosis present

## 2014-09-28 DIAGNOSIS — R0683 Snoring: Secondary | ICD-10-CM | POA: Insufficient documentation

## 2014-10-04 DIAGNOSIS — G4733 Obstructive sleep apnea (adult) (pediatric): Secondary | ICD-10-CM | POA: Diagnosis not present

## 2014-10-04 NOTE — Sleep Study (Signed)
   NAME: Sara Briggs DATE OF BIRTH:  04/05/1966 MEDICAL RECORD NUMBER [id number]  LOCATION: [facility]  PHYSICIAN: Annalise Mcdiarmid D  DATE OF STUDY: 09/28/2014  SLEEP STUDY TYPE: Nocturnal Polysomnogram               REFERRING PHYSICIAN: Harlan Stains, MD  INDICATION FOR STUDY: Hypersomnia with sleep apnea  EPWORTH SLEEPINESS SCORE:   12/24 HEIGHT: 5\' 3"  (160 cm)  WEIGHT: 190 lb (86.183 kg)    Body mass index is 33.67 kg/(m^2).  NECK SIZE: 14.5 in.  MEDICATIONS: Charted for review  SLEEP ARCHITECTURE: Total sleep time 241 minutes with sleep efficiency 65.5%. Stage I was 6%, stage II 85.5%, stage III absent, REM 8.5% of total sleep time. Sleep latency 47.5 minutes, REM latency 139.5 minutes, awake after sleep onset 79.5 minutes, arousal index 5.0, bedtime medication: None  RESPIRATORY DATA: Apnea hypopnea index (AHI) 7.7 per hour. 31 total events scored including one obstructive apnea and 30 hypopneas. Most events were nonsupine. REM AHI 0. Not enough sleep or respiratory events in the first hours of the study to meet protocol requirements for split CPAP titration.  OXYGEN DATA: Moderate snoring with oxygen desaturation to a nadir of 90% and mean saturation 93.9% on room air  CARDIAC DATA: Sinus rhythm with PACs  MOVEMENT/PARASOMNIA: No significant movement disturbance, bathroom 1  IMPRESSION/ RECOMMENDATION:   1) Mild obstructive sleep apnea/hypopnea syndrome, AHI 7.7 per hour with most sleep and events nonsupine. REM AHI 0. Moderate snoring with oxygen desaturation to a nadir of 90% and mean saturation 93.9% on room air. 2) Difficulty initiating and maintaining sleep. Sustained sleep onset around 12:15 with repeated brief spontaneous awakenings throughout the night. No bedtime medication reported. 3) Obstructive sleep apnea with scores in this range may respond to conservative management including weight loss and treatment for any significant nasal congestion  when appropriate. An oral appliance or CPAP might be appropriate on an individual basis. Her difficulty initiating and maintaining sleep might respond to management for insomnia if appropriate.   Deneise Lever Diplomate, American Board of Sleep Medicine  ELECTRONICALLY SIGNED ON:  10/04/2014, 10:46 AM [place] PH: [phone]   FX: [phone] [place]

## 2014-10-24 ENCOUNTER — Other Ambulatory Visit: Payer: Self-pay

## 2014-10-24 NOTE — Patient Outreach (Signed)
[name]) Care Management  10/24/2014  Sara Briggs 1965/05/12 [id number] Phone call to schedule appointment.  No answer and message left.  Plan to send appointment request letter. Last [facility] visit was 03/12/15. Peter Garter RN, [facility] Care Management Coordinator-[facility] to [place] Management [phone]

## 2014-10-25 ENCOUNTER — Encounter: Payer: Self-pay | Admitting: Emergency Medicine

## 2014-10-25 ENCOUNTER — Emergency Department (INDEPENDENT_AMBULATORY_CARE_PROVIDER_SITE_OTHER)
Admission: EM | Admit: 2014-10-25 | Discharge: 2014-10-25 | Disposition: A | Discharge status: Home / Self Care | Payer: Worker's Compensation | Source: Home / Self Care | Attending: Family Medicine | Admitting: Family Medicine

## 2014-10-25 ENCOUNTER — Emergency Department (INDEPENDENT_AMBULATORY_CARE_PROVIDER_SITE_OTHER): Payer: Worker's Compensation

## 2014-10-25 DIAGNOSIS — M25561 Pain in right knee: Secondary | ICD-10-CM | POA: Diagnosis not present

## 2014-10-25 DIAGNOSIS — T1490 Injury, unspecified: Secondary | ICD-10-CM | POA: Diagnosis not present

## 2014-10-25 DIAGNOSIS — T1490XA Injury, unspecified, initial encounter: Secondary | ICD-10-CM

## 2014-10-25 NOTE — ED Notes (Signed)
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 She states"I was in a patients room on 10/24/2014 and struck my right knee on a bedside table. No discoloration or edema noted ambulates without problems. Rates pain a 3-4/10. Pain decreases with movement.[place] supervisor at [employer] and she advised that employee did not require a drug screen.

## 2014-10-25 NOTE — ED Provider Notes (Signed)
CSN: [id number]     Arrival date & time 10/25/14  0919 History   First MD Initiated Contact with Patient 10/25/14 [phone]     Chief Complaint  Patient presents with  . Knee Injury   (Consider location/radiation/quality/duration/timing/severity/associated sxs/prior Treatment) HPI Pt is a 49yo female presenting to UC with c/o sudden onset Right knee pain that occurred after an incident last night around 5PM.  Pt states she was turning and accidentally slammed her Right knee into a pt's metal bed.  States she also feels like it scrapped across her knee but denies any bleeding after the incident.  Pain is aching and sore, was moderate in severity but now mild, improved with ibuprofen and tramadol.  Denies prior Right knee injuries or surgeries. No other injuries.    Past Medical History  Diagnosis Date  . Allergic   . Rectocele   . IBS (irritable bowel syndrome)   . Diabetes mellitus without complication   . IBS (irritable bowel syndrome)   . Hyperlipidemia   . Obesity    Past Surgical History  Procedure Laterality Date  . Btl    . Laparoscopy for ectopic pregnancy    . Cesarean section     Family History  Problem Relation Age of Onset  . Diabetes Mother   . Hypertension Mother   . Hypertension Father   . Colon cancer Maternal Uncle   . Colon cancer Maternal Grandfather    History  Substance Use Topics  . Smoking status: Never Smoker   . Smokeless tobacco: Never Used  . Alcohol Use: No   OB History    No data available     Review of Systems  Constitutional: Negative for fever and chills.  Musculoskeletal: Positive for myalgias and arthralgias. Negative for back pain, joint swelling, gait problem, neck pain and neck stiffness.       Right knee  Skin: Negative for color change, rash and wound.  Neurological: Negative for weakness and numbness.    Allergies  Apple; Tape; Percocet; Sulfa antibiotics; and Vicodin  Home Medications   Prior to Admission medications    Medication Sig Start Date End Date Taking? Authorizing Provider  acetaminophen (TYLENOL) 500 MG tablet Take 500 mg by mouth every 6 (six) hours as needed for pain.    Historical Provider, MD  cetirizine (ZYRTEC) 10 MG tablet Take 10 mg by mouth every evening.     Historical Provider, MD  cyclobenzaprine (FLEXERIL) 10 MG tablet Take 10 mg by mouth 3 (three) times daily as needed for muscle spasms.    Historical Provider, MD  diclofenac (VOLTAREN) 75 MG EC tablet TAKE 1 TABLET BY MOUTH TWICE DAILY 06/11/14   Tamala Fothergill Regal, DPM  fluticasone (FLONASE) 50 MCG/ACT nasal spray Place 2 sprays into the nose daily.    Historical Provider, MD  ibuprofen (ADVIL,MOTRIN) 200 MG tablet Take 400 mg by mouth every 6 (six) hours as needed for pain.    Historical Provider, MD  naproxen (NAPROSYN) 500 MG tablet Take 500 mg by mouth 2 (two) times daily as needed (for pain).    Historical Provider, MD  ondansetron (ZOFRAN ODT) 4 MG disintegrating tablet 4mg  ODT q6 hours prn nausea/vomit 10/15/12   Milton Ferguson, MD  simethicone (MYLICON) 80 MG chewable tablet Chew 80 mg by mouth every 6 (six) hours as needed for flatulence.    Historical Provider, MD   BP 111/78 mmHg  Pulse 87  Temp(Src) 98.6 F (37 C) (Oral)  Resp 16  Ht  5\' 3"  (1.6 m)  Wt 190 lb (86.183 kg)  BMI 33.67 kg/m2  SpO2 100%  LMP 10/01/2012 Physical Exam  Constitutional: She is oriented to person, place, and time. She appears well-developed and well-nourished.  HENT:  Head: Normocephalic and atraumatic.  Eyes: EOM are normal.  Neck: Normal range of motion.  Cardiovascular: Normal rate.   Pulses:      Dorsalis pedis pulses are 2+ on the right side.  Pulmonary/Chest: Effort normal. No respiratory distress.  Musculoskeletal: Normal range of motion. She exhibits tenderness. She exhibits no edema.  FROM upper and lower extremities. Right knee: no obvious deformity or edema.  FROM Right knee. Tenderness over patella. No calf tenderness. Compartments  are soft.  Neurological: She is alert and oriented to person, place, and time.  Normal gait. Sensation in tact in Right leg, symmetric compared to left.  Skin: Skin is warm and dry. No rash noted. No erythema.  Right knee: Skin in tact, no ecchymosis or erythema.   Psychiatric: She has a normal mood and affect. Her behavior is normal.  Nursing note and vitals reviewed.   ED Course  Procedures (including critical care time) Labs Review Labs Reviewed - No data to display  Imaging Review Dg Knee Complete 4 Views Right  10/25/2014   CLINICAL DATA:  Blunt trauma yesterday  EXAM: RIGHT KNEE - COMPLETE 4+ VIEW  COMPARISON:  None.  FINDINGS: There is no evidence of fracture, dislocation, or joint effusion. There is no evidence of arthropathy or other focal bone abnormality. Soft tissues are unremarkable.  IMPRESSION: Negative.   Electronically Signed   By: Lucrezia Europe M.D.   On: 10/25/2014 10:20     MDM   1. Right knee pain   2. Injury    Pt presenting to UC with Right knee pain after hitting it on a metal pt bed.  Right leg is neurovascularly in tact. No skin injury.  Plain films: negative for fracture, dislocation or joint effusion.  Pt is able to ambulate w/o difficulty.  Pt here for worker's comp. No restrictions given to pt. Pt may return to work as scheduled Tuesday, July 5th 2016.  She may continue taking ibuprofen and tramadol for pain, may also use ice and elevation. Advised not to drive or work while taking tramadol.  Advised to f/u with PCP in 1-2 weeks if pain not improving. Pt verbalized understanding and agreement with tx plan.     Noland Fordyce, PA-C 10/25/14 1056

## 2014-10-25 NOTE — ED Notes (Signed)
Patient transported to X-ray 

## 2014-10-25 NOTE — ED Notes (Signed)
Return from xray

## 2014-10-25 NOTE — Discharge Instructions (Signed)
You may continue to take ibuprofen and tramadol as needed for Right knee pain.  You may also use ice and elevation to help with pain.  Follow up with your family doctor in 1-2 weeks if not improving. You may need a referral to orthopedist.  See below for further instructions.

## 2014-11-14 ENCOUNTER — Other Ambulatory Visit: Payer: Self-pay

## 2014-11-14 NOTE — Patient Outreach (Signed)
[facility]) Care Management  11/14/2014  Sara Briggs 17-Dec-1965 [id number]  Member has not returned phone messages and has not responded to appointment request letter sent on 10/24/14. Case to be closed for not following requirements of program. Peter Garter RN, [facility] Care Management Coordinator-Link to [facility] [phone]

## 2014-11-25 ENCOUNTER — Other Ambulatory Visit: Payer: Self-pay | Admitting: Podiatry

## 2014-11-25 NOTE — Telephone Encounter (Signed)
Pt needs an appt prior to refills if continuing to have problems.

## 2015-01-14 ENCOUNTER — Other Ambulatory Visit: Payer: Self-pay

## 2015-01-14 DIAGNOSIS — Z1231 Encounter for screening mammogram for malignant neoplasm of breast: Secondary | ICD-10-CM

## 2015-02-10 ENCOUNTER — Ambulatory Visit
Admission: RE | Admit: 2015-02-10 | Discharge: 2015-02-10 | Disposition: A | Discharge status: Home / Self Care | Payer: 59 | Source: Ambulatory Visit

## 2015-02-10 DIAGNOSIS — Z1231 Encounter for screening mammogram for malignant neoplasm of breast: Secondary | ICD-10-CM

## 2015-02-13 ENCOUNTER — Other Ambulatory Visit: Payer: Self-pay | Admitting: Family Medicine

## 2015-02-13 DIAGNOSIS — R928 Other abnormal and inconclusive findings on diagnostic imaging of breast: Secondary | ICD-10-CM

## 2015-02-17 ENCOUNTER — Ambulatory Visit
Admission: RE | Admit: 2015-02-17 | Discharge: 2015-02-17 | Disposition: A | Discharge status: Home / Self Care | Payer: 59 | Source: Ambulatory Visit | Attending: Family Medicine | Admitting: Family Medicine

## 2015-02-17 DIAGNOSIS — R928 Other abnormal and inconclusive findings on diagnostic imaging of breast: Secondary | ICD-10-CM

## 2015-05-11 DIAGNOSIS — R350 Frequency of micturition: Secondary | ICD-10-CM | POA: Diagnosis not present

## 2015-05-11 MED FILL — FLUCONAZOLE 150 MG TABLET: 150 | 7 days supply | Qty: 2 | Fill #0

## 2015-05-11 MED FILL — PROCTOSOL-HC 2.5% CREAM: 2.5 | 30 days supply | Qty: 28 | Fill #4

## 2015-05-11 MED FILL — CIPROFLOXACIN HCL 500 MG TA: 500 | 5 days supply | Qty: 10 | Fill #0

## 2015-05-26 MED FILL — ZOLPIDEM TARTRATE 5 MG TAB: 5 | 30 days supply | Qty: 30 | Fill #1

## 2015-05-26 MED FILL — ONGLYZA 5 MG TABLET: 5 | 30 days supply | Qty: 30 | Fill #0

## 2015-05-27 MED FILL — FLUTICASONE PROP 50 MCG SPR: 50 | 90 days supply | Qty: 48 | Fill #1

## 2015-06-10 MED FILL — TRADJENTA 5 MG TABLET: 5 | 30 days supply | Qty: 30 | Fill #0

## 2015-06-22 DIAGNOSIS — H5213 Myopia, bilateral: Secondary | ICD-10-CM | POA: Diagnosis not present

## 2015-06-22 DIAGNOSIS — H52223 Regular astigmatism, bilateral: Secondary | ICD-10-CM | POA: Diagnosis not present

## 2015-06-22 DIAGNOSIS — H2513 Age-related nuclear cataract, bilateral: Secondary | ICD-10-CM | POA: Diagnosis not present

## 2015-06-22 DIAGNOSIS — H04123 Dry eye syndrome of bilateral lacrimal glands: Secondary | ICD-10-CM | POA: Diagnosis not present

## 2015-06-22 DIAGNOSIS — E119 Type 2 diabetes mellitus without complications: Secondary | ICD-10-CM | POA: Diagnosis not present

## 2015-06-22 DIAGNOSIS — H524 Presbyopia: Secondary | ICD-10-CM | POA: Diagnosis not present

## 2015-06-26 DIAGNOSIS — H40013 Open angle with borderline findings, low risk, bilateral: Secondary | ICD-10-CM | POA: Diagnosis not present

## 2015-06-26 DIAGNOSIS — Z7984 Long term (current) use of oral hypoglycemic drugs: Secondary | ICD-10-CM | POA: Diagnosis not present

## 2015-06-26 DIAGNOSIS — E119 Type 2 diabetes mellitus without complications: Secondary | ICD-10-CM | POA: Diagnosis not present

## 2015-07-09 ENCOUNTER — Emergency Department (HOSPITAL_BASED_OUTPATIENT_CLINIC_OR_DEPARTMENT_OTHER): Payer: 59

## 2015-07-09 ENCOUNTER — Encounter (HOSPITAL_BASED_OUTPATIENT_CLINIC_OR_DEPARTMENT_OTHER): Payer: Self-pay | Admitting: Emergency Medicine

## 2015-07-09 ENCOUNTER — Emergency Department (HOSPITAL_BASED_OUTPATIENT_CLINIC_OR_DEPARTMENT_OTHER)
Admission: EM | Admit: 2015-07-09 | Discharge: 2015-07-10 | Disposition: A | Discharge status: Home / Self Care | Payer: 59 | Attending: Emergency Medicine | Admitting: Emergency Medicine

## 2015-07-09 DIAGNOSIS — E119 Type 2 diabetes mellitus without complications: Secondary | ICD-10-CM | POA: Diagnosis not present

## 2015-07-09 DIAGNOSIS — Z79899 Other long term (current) drug therapy: Secondary | ICD-10-CM | POA: Diagnosis not present

## 2015-07-09 DIAGNOSIS — R079 Chest pain, unspecified: Secondary | ICD-10-CM | POA: Insufficient documentation

## 2015-07-09 DIAGNOSIS — K589 Irritable bowel syndrome without diarrhea: Secondary | ICD-10-CM | POA: Insufficient documentation

## 2015-07-09 DIAGNOSIS — Z7951 Long term (current) use of inhaled steroids: Secondary | ICD-10-CM | POA: Diagnosis not present

## 2015-07-09 DIAGNOSIS — Z791 Long term (current) use of non-steroidal anti-inflammatories (NSAID): Secondary | ICD-10-CM | POA: Insufficient documentation

## 2015-07-09 DIAGNOSIS — R0789 Other chest pain: Secondary | ICD-10-CM | POA: Diagnosis not present

## 2015-07-09 DIAGNOSIS — E669 Obesity, unspecified: Secondary | ICD-10-CM | POA: Insufficient documentation

## 2015-07-09 LAB — CBC
HCT: 45.4 % (ref 36.0–46.0)
Hemoglobin: 14.8 g/dL (ref 12.0–15.0)
MCH: 31 pg (ref 26.0–34.0)
MCHC: 32.6 g/dL (ref 30.0–36.0)
MCV: 95 fL (ref 78.0–100.0)
Platelets: 321 10*3/uL (ref 150–400)
RBC: 4.78 MIL/uL (ref 3.87–5.11)
RDW: 13.3 % (ref 11.5–15.5)
WBC: 7 10*3/uL (ref 4.0–10.5)

## 2015-07-09 LAB — BASIC METABOLIC PANEL
Anion gap: 10 (ref 5–15)
BUN: 17 mg/dL (ref 6–20)
CALCIUM: 9.2 mg/dL (ref 8.9–10.3)
CO2: 27 mmol/L (ref 22–32)
Chloride: 100 mmol/L — ABNORMAL LOW (ref 101–111)
Creatinine, Ser: 0.76 mg/dL (ref 0.44–1.00)
GFR calc non Af Amer: >60 mL/min (ref >60)
GLUCOSE: 136 mg/dL — AB (ref 65–99)
Potassium: 3.9 mmol/L (ref 3.5–5.1)
Sodium: 137 mmol/L (ref 135–145)

## 2015-07-09 LAB — TROPONIN I: Troponin I: <0.03 ng/mL (ref <0.031)

## 2015-07-09 MED ORDER — GI COCKTAIL ~~LOC~~
30.0000 mL | Freq: Once | ORAL | Status: AC
  Administered 2015-07-09: 30 mL via ORAL
  Filled 2015-07-09: qty 30

## 2015-07-09 NOTE — ED Notes (Signed)
Pt c/o centralized chest pain starting yesterday and took a baby Asprin. Pt is alert and oriented a triage. NAD.

## 2015-07-09 NOTE — ED Provider Notes (Signed)
CSN: [id number]     Arrival date & time 07/09/15  1945 History  By signing my name below, I, Dora Sims, attest that this documentation has been prepared under the direction and in the presence of physician practitioner, Quintella Reichert, MD,. Electronically Signed: Dora Sims, Scribe. 07/09/2015. 10:13 PM.    Chief Complaint  Patient presents with  . Chest Pain    The history is provided by the patient. No language interpreter was used.     HPI Comments: Sara Briggs is a 50 y.o. female with h/o DM and HLD who presents to the Emergency Department complaining of sudden onset, constant, central chest pressure for the last two days. She notes that she is experiencing chest discomfort currently. Pt thought she had temporary indigestion; her symptoms persisted so she decided to come to the ER. She notes no exacerbating factors. She has FMHx of heart disease; her grandmother had heart disease in her early 51s along with multiple aunts, uncles, and cousins. Pt notes that her parents have HTN but no h/o MI. She has no h/o blood clots. Pt is not a smoker. She has taken Gas-X and acid reducers with no alleviation. She denies SOB, fever, cough, abdominal pain, nausea, leg swelling, or any other associated symptoms.  Past Medical History  Diagnosis Date  . Allergic   . Rectocele   . IBS (irritable bowel syndrome)   . Diabetes mellitus without complication ([place])   . IBS (irritable bowel syndrome)   . Hyperlipidemia   . Obesity    Past Surgical History  Procedure Laterality Date  . Btl    . Laparoscopy for ectopic pregnancy    . Cesarean section     Family History  Problem Relation Age of Onset  . Diabetes Mother   . Hypertension Mother   . Hypertension Father   . Colon cancer Maternal Uncle   . Colon cancer Maternal Grandfather    Social History  Substance Use Topics  . Smoking status: Never Smoker   . Smokeless tobacco: Never Used  . Alcohol Use: No   OB History    No data  available     Review of Systems  Constitutional: Negative for fever.  Respiratory: Negative for cough and shortness of breath.   Cardiovascular: Positive for chest pain (chest pressure). Negative for leg swelling.  Gastrointestinal: Negative for nausea and abdominal pain.  All other systems reviewed and are negative.     Allergies  Apple; Tape; Percocet; Sulfa antibiotics; and Vicodin  Home Medications   Prior to Admission medications   Medication Sig Start Date End Date Taking? Authorizing Provider  linagliptin (TRADJENTA) 5 MG TABS tablet Take 5 mg by mouth daily.   Yes Historical Provider, MD  zolpidem (AMBIEN) 5 MG tablet Take 5 mg by mouth at bedtime as needed for sleep.   Yes Historical Provider, MD  acetaminophen (TYLENOL) 500 MG tablet Take 500 mg by mouth every 6 (six) hours as needed for pain.    Historical Provider, MD  cetirizine (ZYRTEC) 10 MG tablet Take 10 mg by mouth every evening.     Historical Provider, MD  cyclobenzaprine (FLEXERIL) 10 MG tablet Take 10 mg by mouth 3 (three) times daily as needed for muscle spasms.    Historical Provider, MD  diclofenac (VOLTAREN) 75 MG EC tablet TAKE 1 TABLET BY MOUTH TWICE DAILY 11/25/14   Tamala Fothergill Regal, DPM  fluticasone (FLONASE) 50 MCG/ACT nasal spray Place 2 sprays into the nose daily.  Historical Provider, MD  ibuprofen (ADVIL,MOTRIN) 200 MG tablet Take 400 mg by mouth every 6 (six) hours as needed for pain.    Historical Provider, MD  naproxen (NAPROSYN) 500 MG tablet Take 500 mg by mouth 2 (two) times daily as needed (for pain).    Historical Provider, MD  omeprazole (PRILOSEC) 20 MG capsule Take 1 capsule (20 mg total) by mouth daily. 07/10/15   Quintella Reichert, MD  ondansetron (ZOFRAN ODT) 4 MG disintegrating tablet 4mg  ODT q6 hours prn nausea/vomit 10/15/12   Milton Ferguson, MD  simethicone (MYLICON) 80 MG chewable tablet Chew 80 mg by mouth every 6 (six) hours as needed for flatulence.    Historical Provider, MD   BP  109/65 mmHg  Pulse 89  Temp(Src) 97.7 F (36.5 C) (Oral)  Resp 18  Ht 5\' 3"  (1.6 m)  Wt 190 lb (86.183 kg)  BMI 33.67 kg/m2  SpO2 100%  LMP 10/01/2012 Physical Exam  Constitutional: She is oriented to person, place, and time. She appears well-developed and well-nourished.  HENT:  Head: Normocephalic and atraumatic.  Cardiovascular: Normal rate and regular rhythm.   No murmur heard. Pulmonary/Chest: Effort normal and breath sounds normal. No respiratory distress.  Abdominal: Soft. There is no tenderness. There is no rebound and no guarding.  Musculoskeletal: She exhibits no edema or tenderness.  Neurological: She is alert and oriented to person, place, and time.  Skin: Skin is warm and dry.  Psychiatric: She has a normal mood and affect. Her behavior is normal.  Nursing note and vitals reviewed.   ED Course  Procedures (including critical care time)  DIAGNOSTIC STUDIES: Oxygen Saturation is 99% on RA, normal by my interpretation.    COORDINATION OF CARE: 10:13 PM Will administer GI cocktail. Will order DG Chest 2 View. Will order blood work. Will order cardiac monitoring, pulse oximetry, ED EKG, and saline lock IV. Discussed treatment plan with pt at bedside and pt agreed to plan.   Labs Review Labs Reviewed  BASIC METABOLIC PANEL - Abnormal; Notable for the following:    Chloride 100 (*)    Glucose, Bld 136 (*)    All other components within normal limits  CBC  TROPONIN I  TROPONIN I    Imaging Review Dg Chest 2 View  07/09/2015  CLINICAL DATA:  Mid chest pain for 2 days.  History of diabetes. EXAM: CHEST  2 VIEW COMPARISON:  None. FINDINGS: The heart size and mediastinal contours are within normal limits. Both lungs are clear. The visualized skeletal structures are unremarkable. Mild scoliosis noted within the upper lumbar spine. IMPRESSION: No active cardiopulmonary disease. Electronically Signed   By: Franki Cabot M.D.   On: 07/09/2015 21:17   I have personally  reviewed and evaluated these images and lab results as part of my medical decision-making.   EKG Interpretation   Date/Time:  Thursday July 09 2015 19:54:34 EDT Ventricular Rate:  71 PR Interval:  144 QRS Duration: 82 QT Interval:  406 QTC Calculation: 441 R Axis:   70 Text Interpretation:  Normal sinus rhythm Normal ECG Confirmed by Hazle Coca [phone]) on 07/09/2015 9:55:58 PM      MDM   Final diagnoses:  Chest pain, unspecified chest pain type   Patient here for evaluation of chest pain, resolved in the emergency department after GI cocktail. Presentation is not consistent with ACS, PE, dissection, pancreatitis, cholecystitis. Discussed with patient home care for reflux, chest pain. Discussed outpatient follow-up, return precautions.  I personally performed  the services described in this documentation, which was scribed in my presence. The recorded information has been reviewed and is accurate.     Quintella Reichert, MD 07/10/15 [phone]

## 2015-07-10 LAB — TROPONIN I: Troponin I: <0.03 ng/mL (ref <0.031)

## 2015-07-10 MED ORDER — OMEPRAZOLE 20 MG PO CPDR: 20.0000 mg | DELAYED_RELEASE_CAPSULE | Freq: Every day | ORAL | Status: DC

## 2015-07-10 MED FILL — TRADJENTA 5 MG TABLET: 5 | 30 days supply | Qty: 30 | Fill #1

## 2015-07-10 MED FILL — ZOLPIDEM TARTRATE 5 MG TAB: 5 | 30 days supply | Qty: 30 | Fill #0

## 2015-07-10 NOTE — Discharge Instructions (Signed)
Nonspecific Chest Pain  °Chest pain can be caused by many different conditions. There is always a chance that your pain could be related to something serious, such as a heart attack or a blood clot in your lungs. Chest pain can also be caused by conditions that are not life-threatening. If you have chest pain, it is very important to follow up with your health care provider. °CAUSES  °Chest pain can be caused by: °· Heartburn. °· Pneumonia or bronchitis. °· Anxiety or stress. °· Inflammation around your heart (pericarditis) or lung (pleuritis or pleurisy). °· A blood clot in your lung. °· A collapsed lung (pneumothorax). It can develop suddenly on its own (spontaneous pneumothorax) or from trauma to the chest. °· Shingles infection (varicella-zoster virus). °· Heart attack. °· Damage to the bones, muscles, and cartilage that make up your chest wall. This can include: °¨ Bruised bones due to injury. °¨ Strained muscles or cartilage due to frequent or repeated coughing or overwork. °¨ Fracture to one or more ribs. °¨ Sore cartilage due to inflammation (costochondritis). °RISK FACTORS  °Risk factors for chest pain may include: °· Activities that increase your risk for trauma or injury to your chest. °· Respiratory infections or conditions that cause frequent coughing. °· Medical conditions or overeating that can cause heartburn. °· Heart disease or family history of heart disease. °· Conditions or health behaviors that increase your risk of developing a blood clot. °· Having had chicken pox (varicella zoster). °SIGNS AND SYMPTOMS °Chest pain can feel like: °· Burning or tingling on the surface of your chest or deep in your chest. °· Crushing, pressure, aching, or squeezing pain. °· Dull or sharp pain that is worse when you move, cough, or take a deep breath. °· Pain that is also felt in your back, neck, shoulder, or arm, or pain that spreads to any of these areas. °Your chest pain may come and go, or it may stay  constant. °DIAGNOSIS °Lab tests or other studies may be needed to find the cause of your pain. Your health care provider may have you take a test called an ambulatory ECG (electrocardiogram). An ECG records your heartbeat patterns at the time the test is performed. You may also have other tests, such as: °· Transthoracic echocardiogram (TTE). During echocardiography, sound waves are used to create a picture of all of the heart structures and to look at how blood flows through your heart. °· Transesophageal echocardiogram (TEE). This is a more advanced imaging test that obtains images from inside your body. It allows your health care provider to see your heart in finer detail. °· Cardiac monitoring. This allows your health care provider to monitor your heart rate and rhythm in real time. °· Holter monitor. This is a portable device that records your heartbeat and can help to diagnose abnormal heartbeats. It allows your health care provider to track your heart activity for several days, if needed. °· Stress tests. These can be done through exercise or by taking medicine that makes your heart beat more quickly. °· Blood tests. °· Imaging tests. °TREATMENT  °Your treatment depends on what is causing your chest pain. Treatment may include: °· Medicines. These may include: °¨ Acid blockers for heartburn. °¨ Anti-inflammatory medicine. °¨ Pain medicine for inflammatory conditions. °¨ Antibiotic medicine, if an infection is present. °¨ Medicines to dissolve blood clots. °¨ Medicines to treat coronary artery disease. °· Supportive care for conditions that do not require medicines. This may include: °¨ Resting. °¨ Applying heat   or cold packs to injured areas. °¨ Limiting activities until pain decreases. °HOME CARE INSTRUCTIONS °· If you were prescribed an antibiotic medicine, finish it all even if you start to feel better. °· Avoid any activities that bring on chest pain. °· Do not use any tobacco products, including  cigarettes, chewing tobacco, or electronic cigarettes. If you need help quitting, ask your health care provider. °· Do not drink alcohol. °· Take medicines only as directed by your health care provider. °· Keep all follow-up visits as directed by your health care provider. This is important. This includes any further testing if your chest pain does not go away. °· If heartburn is the cause for your chest pain, you may be told to keep your head raised (elevated) while sleeping. This reduces the chance that acid will go from your stomach into your esophagus. °· Make lifestyle changes as directed by your health care provider. These may include: °¨ Getting regular exercise. Ask your health care provider to suggest some activities that are safe for you. °¨ Eating a heart-healthy diet. A registered dietitian can help you to learn healthy eating options. °¨ Maintaining a healthy weight. °¨ Managing diabetes, if necessary. °¨ Reducing stress. °SEEK MEDICAL CARE IF: °· Your chest pain does not go away after treatment. °· You have a rash with blisters on your chest. °· You have a fever. °SEEK IMMEDIATE MEDICAL CARE IF:  °· Your chest pain is worse. °· You have an increasing cough, or you cough up blood. °· You have severe abdominal pain. °· You have severe weakness. °· You faint. °· You have chills. °· You have sudden, unexplained chest discomfort. °· You have sudden, unexplained discomfort in your arms, back, neck, or jaw. °· You have shortness of breath at any time. °· You suddenly start to sweat, or your skin gets clammy. °· You feel nauseous or you vomit. °· You suddenly feel light-headed or dizzy. °· Your heart begins to beat quickly, or it feels like it is skipping beats. °These symptoms may represent a serious problem that is an emergency. Do not wait to see if the symptoms will go away. Get medical help right away. Call your local emergency services (911 in the [place] Do not drive yourself to the hospital. °  °This  information is not intended to replace advice given to you by your health care provider. Make sure you discuss any questions you have with your health care provider. °  °Document Released: 01/19/2005 Document Revised: 05/02/2014 Document Reviewed: 11/15/2013 °Elsevier Interactive Patient Education ©2016 [employer] ° °

## 2015-07-20 MED FILL — OSELTAMIVIR PHOS 75 MG CAP: 75 | 5 days supply | Qty: 10 | Fill #0

## 2015-07-20 MED FILL — OMEPRAZOLE DR 20 MG CAPSULE: 20 | 30 days supply | Qty: 30 | Fill #0

## 2015-07-23 DIAGNOSIS — N904 Leukoplakia of vulva: Secondary | ICD-10-CM | POA: Diagnosis not present

## 2015-07-23 DIAGNOSIS — G4700 Insomnia, unspecified: Secondary | ICD-10-CM | POA: Diagnosis not present

## 2015-07-23 DIAGNOSIS — K219 Gastro-esophageal reflux disease without esophagitis: Secondary | ICD-10-CM | POA: Diagnosis not present

## 2015-07-23 DIAGNOSIS — Z0000 Encounter for general adult medical examination without abnormal findings: Secondary | ICD-10-CM | POA: Diagnosis not present

## 2015-07-23 DIAGNOSIS — E119 Type 2 diabetes mellitus without complications: Secondary | ICD-10-CM | POA: Diagnosis not present

## 2015-07-23 DIAGNOSIS — E785 Hyperlipidemia, unspecified: Secondary | ICD-10-CM | POA: Diagnosis not present

## 2015-07-23 DIAGNOSIS — Z7984 Long term (current) use of oral hypoglycemic drugs: Secondary | ICD-10-CM | POA: Diagnosis not present

## 2015-07-23 DIAGNOSIS — Z23 Encounter for immunization: Secondary | ICD-10-CM | POA: Diagnosis not present

## 2015-07-23 DIAGNOSIS — R079 Chest pain, unspecified: Secondary | ICD-10-CM | POA: Diagnosis not present

## 2015-07-24 MED FILL — ZOLPIDEM TARTRATE 10 MG TAB: 10 | 30 days supply | Qty: 30 | Fill #0

## 2015-08-05 MED FILL — HYDROCORTISONE 2.5% CREAM: 2.5 | 25 days supply | Qty: 60 | Fill #0

## 2015-08-11 MED FILL — TRADJENTA 5 MG TABLET: 5 | 90 days supply | Qty: 90 | Fill #0

## 2015-08-11 MED FILL — TRUEplus LANCETS 30G MISC: 75 days supply | Qty: 300 | Fill #0

## 2015-08-11 MED FILL — TRUE METRIX GLUCOSE TEST ST: 75 days supply | Qty: 300 | Fill #0

## 2015-09-08 MED FILL — ZOLPIDEM TARTRATE 10 MG TAB: 10 | 30 days supply | Qty: 30 | Fill #0

## 2015-09-17 DIAGNOSIS — D485 Neoplasm of uncertain behavior of skin: Secondary | ICD-10-CM | POA: Diagnosis not present

## 2015-09-17 DIAGNOSIS — L821 Other seborrheic keratosis: Secondary | ICD-10-CM | POA: Diagnosis not present

## 2015-09-17 DIAGNOSIS — L814 Other melanin hyperpigmentation: Secondary | ICD-10-CM | POA: Diagnosis not present

## 2015-09-17 DIAGNOSIS — M259 Joint disorder, unspecified: Secondary | ICD-10-CM | POA: Diagnosis not present

## 2015-09-17 DIAGNOSIS — Z85828 Personal history of other malignant neoplasm of skin: Secondary | ICD-10-CM | POA: Diagnosis not present

## 2015-09-17 DIAGNOSIS — L570 Actinic keratosis: Secondary | ICD-10-CM | POA: Diagnosis not present

## 2015-10-23 MED FILL — TRADJENTA 5 MG TABLET: 5 | 30 days supply | Qty: 30 | Fill #2

## 2015-10-23 MED FILL — FLUTICASONE PROP 50 MCG SPR: 50 | 90 days supply | Qty: 48 | Fill #0

## 2015-10-30 ENCOUNTER — Encounter: Payer: Self-pay | Admitting: Pediatrics

## 2015-10-30 MED FILL — ESZOPICLONE 2 MG TABLET: 2 | 30 days supply | Qty: 30 | Fill #0

## 2015-11-04 DIAGNOSIS — M25541 Pain in joints of right hand: Secondary | ICD-10-CM | POA: Diagnosis not present

## 2015-11-30 ENCOUNTER — Encounter: Payer: Self-pay | Admitting: Pediatrics

## 2015-12-01 MED FILL — ESZOPICLONE 2 MG TABLET: 2 | 30 days supply | Qty: 30 | Fill #0

## 2015-12-02 MED FILL — TRUEplus LANCETS 30G MISC: 75 days supply | Qty: 300 | Fill #1

## 2015-12-02 MED FILL — ZOLPIDEM TARTRATE 10 MG TAB: 10 | 30 days supply | Qty: 30 | Fill #0

## 2016-01-02 MED FILL — ESZOPICLONE 2 MG TABLET: 2 | 30 days supply | Qty: 30 | Fill #1

## 2016-01-22 DIAGNOSIS — G4700 Insomnia, unspecified: Secondary | ICD-10-CM | POA: Diagnosis not present

## 2016-01-22 DIAGNOSIS — E785 Hyperlipidemia, unspecified: Secondary | ICD-10-CM | POA: Diagnosis not present

## 2016-01-22 DIAGNOSIS — L602 Onychogryphosis: Secondary | ICD-10-CM | POA: Diagnosis not present

## 2016-01-22 DIAGNOSIS — E119 Type 2 diabetes mellitus without complications: Secondary | ICD-10-CM | POA: Diagnosis not present

## 2016-01-22 MED FILL — ESZOPICLONE 3 MG TABLET: 3 | 90 days supply | Qty: 90 | Fill #0

## 2016-01-26 MED FILL — JUBLIA 10% TOPICAL SOLUTION: 10 | 30 days supply | Qty: 4 | Fill #0

## 2016-02-06 ENCOUNTER — Ambulatory Visit (HOSPITAL_COMMUNITY)
Admission: EM | Admit: 2016-02-06 | Discharge: 2016-02-06 | Disposition: A | Discharge status: Home / Self Care | Payer: PRIVATE HEALTH INSURANCE | Attending: Internal Medicine | Admitting: Internal Medicine

## 2016-02-06 ENCOUNTER — Ambulatory Visit (INDEPENDENT_AMBULATORY_CARE_PROVIDER_SITE_OTHER): Payer: PRIVATE HEALTH INSURANCE

## 2016-02-06 ENCOUNTER — Encounter (HOSPITAL_COMMUNITY): Payer: Self-pay | Admitting: *Deleted

## 2016-02-06 DIAGNOSIS — S6990XA Unspecified injury of unspecified wrist, hand and finger(s), initial encounter: Secondary | ICD-10-CM

## 2016-02-06 MED ORDER — IBUPROFEN 800 MG PO TABS: 800.0000 mg | ORAL_TABLET | Freq: Three times a day (TID) | ORAL | 0 refills | Status: AC

## 2016-02-06 NOTE — ED Triage Notes (Signed)
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

## 2016-02-06 NOTE — ED Provider Notes (Signed)
CSN: [id number]     Arrival date & time 02/06/16  1941 History   None    Chief Complaint  Patient presents with  . Finger Injury   (Consider location/radiation/quality/duration/timing/severity/associated sxs/prior Treatment) This is a worker's comp case.   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 She reports pain, swelling, and bruising over her right middle finger. She is applying ice to the area. She reports pain to be tolerable. She denies numbness or tingling sensation.       Past Medical History:  Diagnosis Date  . Allergic   . Diabetes mellitus without complication ([place])   . Hyperlipidemia   . IBS (irritable bowel syndrome)   . IBS (irritable bowel syndrome)   . Obesity   . Rectocele    Past Surgical History:  Procedure Laterality Date  . btl    . CESAREAN SECTION    . LAPAROSCOPY FOR ECTOPIC PREGNANCY     Family History  Problem Relation Age of Onset  . Colon cancer Maternal Uncle   . Colon cancer Maternal Grandfather   . Diabetes Mother   . Hypertension Mother   . Hypertension Father    Social History  Substance Use Topics  . Smoking status: Never Smoker  . Smokeless tobacco: Never Used  . Alcohol use No   OB History    No data available     Review of Systems  All other systems reviewed and are negative.   Allergies  Apple; Tape; Percocet [oxycodone-acetaminophen]; Sulfa antibiotics; and Vicodin [hydrocodone-acetaminophen]  Home Medications   Prior to Admission medications   Medication Sig Start Date End Date Taking? Authorizing Provider  acetaminophen (TYLENOL) 500 MG tablet Take 500 mg by mouth every 6 (six) hours as needed for pain.    Historical Provider, MD  cetirizine (ZYRTEC) 10 MG tablet Take 10 mg by mouth every evening.     Historical Provider, MD  cyclobenzaprine (FLEXERIL) 10 MG tablet Take 10 mg by mouth 3 (three) times daily as needed for muscle spasms.    Historical Provider, MD   diclofenac (VOLTAREN) 75 MG EC tablet TAKE 1 TABLET BY MOUTH TWICE DAILY 11/25/14   Tamala Fothergill Regal, DPM  fluticasone (FLONASE) 50 MCG/ACT nasal spray Place 2 sprays into the nose daily.    Historical Provider, MD  ibuprofen (ADVIL,MOTRIN) 800 MG tablet Take 1 tablet (800 mg total) by mouth 3 (three) times daily. 02/06/16 02/13/16  Barry Dienes, NP  linagliptin (TRADJENTA) 5 MG TABS tablet Take 5 mg by mouth daily.    Historical Provider, MD  naproxen (NAPROSYN) 500 MG tablet Take 500 mg by mouth 2 (two) times daily as needed (for pain).    Historical Provider, MD  omeprazole (PRILOSEC) 20 MG capsule Take 1 capsule (20 mg total) by mouth daily. 07/10/15   Quintella Reichert, MD  ondansetron (ZOFRAN ODT) 4 MG disintegrating tablet 4mg  ODT q6 hours prn nausea/vomit 10/15/12   Milton Ferguson, MD  simethicone (MYLICON) 80 MG chewable tablet Chew 80 mg by mouth every 6 (six) hours as needed for flatulence.    Historical Provider, MD  zolpidem (AMBIEN) 5 MG tablet Take 5 mg by mouth at bedtime as needed for sleep.    Historical Provider, MD   Meds Ordered and Administered this Visit  Medications - No data to display  BP 93/61 (BP Location: Left Arm)   Pulse 75   Temp 97.3 F (36.3 C) (Oral)   Resp 14  LMP 10/01/2012   SpO2 98%  No data found.   Physical Exam  Constitutional: She is oriented to person, place, and time. She appears well-developed and well-nourished.  HENT:  Head: Normocephalic and atraumatic.  Cardiovascular: Normal rate, regular rhythm and normal heart sounds.   Pulmonary/Chest: Effort normal and breath sounds normal. No respiratory distress. She has no wheezes.  Musculoskeletal:  Right middle finger is swollen at the DIP joint. DIP joint has ecchymosis and has limited ROM and is tender to palpate.  Neurological: She is alert and oriented to person, place, and time.  Skin: Skin is warm and dry.  Nursing note and vitals reviewed.   Urgent Care Course   Clinical Course     Procedures (including critical care time)  Labs Review Labs Reviewed - No data to display  Imaging Review Dg Finger Middle Right  Result Date: 02/06/2016 CLINICAL DATA:  Work injury with right middle finger pain. Initial encounter. EXAM: RIGHT MIDDLE FINGER 2+V COMPARISON:  None. FINDINGS: There is no evidence of fracture or dislocation. Soft tissues are unremarkable. IMPRESSION: Negative. Electronically Signed   By: Monte Fantasia M.D.   On: 02/06/2016 20:53     MDM   1. 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 Discharge paperwork given.     Barry Dienes, NP 02/06/16 2104

## 2016-02-12 MED FILL — FLUTICASONE PROP 50 MCG SPR: 50 | 90 days supply | Qty: 48 | Fill #1

## 2016-02-19 ENCOUNTER — Other Ambulatory Visit: Payer: Self-pay | Admitting: Family Medicine

## 2016-02-19 DIAGNOSIS — Z1231 Encounter for screening mammogram for malignant neoplasm of breast: Secondary | ICD-10-CM

## 2016-03-25 MED FILL — CYCLOBENZAPRINE 10 MG TAB: 10 | 30 days supply | Qty: 30 | Fill #0

## 2016-03-25 MED FILL — JUBLIA 10% TOPICAL SOLUTION: 10 | 30 days supply | Qty: 4 | Fill #1

## 2016-03-25 MED FILL — ESZOPICLONE 3 MG TABLET: 3 | 90 days supply | Qty: 90 | Fill #1

## 2016-03-28 ENCOUNTER — Ambulatory Visit: Payer: Self-pay

## 2016-04-05 ENCOUNTER — Ambulatory Visit
Admission: RE | Admit: 2016-04-05 | Discharge: 2016-04-05 | Disposition: A | Discharge status: Home / Self Care | Payer: 59 | Source: Ambulatory Visit | Attending: Family Medicine | Admitting: Family Medicine

## 2016-04-05 DIAGNOSIS — Z1231 Encounter for screening mammogram for malignant neoplasm of breast: Secondary | ICD-10-CM

## 2016-04-07 DIAGNOSIS — H9313 Tinnitus, bilateral: Secondary | ICD-10-CM | POA: Diagnosis not present

## 2016-04-07 DIAGNOSIS — J320 Chronic maxillary sinusitis: Secondary | ICD-10-CM | POA: Diagnosis not present

## 2016-04-07 DIAGNOSIS — J370 Chronic laryngitis: Secondary | ICD-10-CM | POA: Diagnosis not present

## 2016-04-07 DIAGNOSIS — R490 Dysphonia: Secondary | ICD-10-CM | POA: Diagnosis not present

## 2016-04-07 DIAGNOSIS — H6521 Chronic serous otitis media, right ear: Secondary | ICD-10-CM | POA: Diagnosis not present

## 2016-04-07 DIAGNOSIS — J322 Chronic ethmoidal sinusitis: Secondary | ICD-10-CM | POA: Diagnosis not present

## 2016-04-07 DIAGNOSIS — H903 Sensorineural hearing loss, bilateral: Secondary | ICD-10-CM | POA: Diagnosis not present

## 2016-04-07 MED FILL — FLUCONAZOLE 150 MG TABLET: 150 | 10 days supply | Qty: 3 | Fill #0

## 2016-04-07 MED FILL — CEFUROXIME AXETIL 250 MG TA: 250 | 10 days supply | Qty: 20 | Fill #0

## 2016-04-08 ENCOUNTER — Other Ambulatory Visit: Payer: Self-pay | Admitting: Family Medicine

## 2016-04-08 DIAGNOSIS — R928 Other abnormal and inconclusive findings on diagnostic imaging of breast: Secondary | ICD-10-CM

## 2016-04-13 ENCOUNTER — Ambulatory Visit
Admission: RE | Admit: 2016-04-13 | Discharge: 2016-04-13 | Disposition: A | Discharge status: Home / Self Care | Payer: 59 | Source: Ambulatory Visit | Attending: Family Medicine | Admitting: Family Medicine

## 2016-04-13 DIAGNOSIS — R928 Other abnormal and inconclusive findings on diagnostic imaging of breast: Secondary | ICD-10-CM

## 2016-04-13 DIAGNOSIS — N6001 Solitary cyst of right breast: Secondary | ICD-10-CM | POA: Diagnosis not present

## 2016-04-13 DIAGNOSIS — N6313 Unspecified lump in the right breast, lower outer quadrant: Secondary | ICD-10-CM | POA: Diagnosis not present

## 2016-04-14 DIAGNOSIS — J322 Chronic ethmoidal sinusitis: Secondary | ICD-10-CM | POA: Diagnosis not present

## 2016-04-14 DIAGNOSIS — J370 Chronic laryngitis: Secondary | ICD-10-CM | POA: Diagnosis not present

## 2016-04-14 DIAGNOSIS — H6521 Chronic serous otitis media, right ear: Secondary | ICD-10-CM | POA: Diagnosis not present

## 2016-04-14 DIAGNOSIS — J320 Chronic maxillary sinusitis: Secondary | ICD-10-CM | POA: Diagnosis not present

## 2016-04-20 MED FILL — ZOLPIDEM TARTRATE 10 MG TAB: 10 | 30 days supply | Qty: 30 | Fill #0

## 2016-05-03 DIAGNOSIS — J029 Acute pharyngitis, unspecified: Secondary | ICD-10-CM | POA: Diagnosis not present

## 2016-05-03 DIAGNOSIS — J069 Acute upper respiratory infection, unspecified: Secondary | ICD-10-CM | POA: Diagnosis not present

## 2016-05-20 DIAGNOSIS — E785 Hyperlipidemia, unspecified: Secondary | ICD-10-CM | POA: Diagnosis not present

## 2016-05-20 DIAGNOSIS — G8929 Other chronic pain: Secondary | ICD-10-CM | POA: Diagnosis not present

## 2016-05-20 DIAGNOSIS — E119 Type 2 diabetes mellitus without complications: Secondary | ICD-10-CM | POA: Diagnosis not present

## 2016-05-20 DIAGNOSIS — M545 Low back pain: Secondary | ICD-10-CM | POA: Diagnosis not present

## 2016-06-01 ENCOUNTER — Ambulatory Visit: Payer: 59 | Attending: Family Medicine | Admitting: Physical Therapy

## 2016-06-01 ENCOUNTER — Encounter: Payer: Self-pay | Admitting: Physical Therapy

## 2016-06-01 DIAGNOSIS — R293 Abnormal posture: Secondary | ICD-10-CM | POA: Diagnosis not present

## 2016-06-01 DIAGNOSIS — M545 Low back pain: Secondary | ICD-10-CM | POA: Insufficient documentation

## 2016-06-01 DIAGNOSIS — G8929 Other chronic pain: Secondary | ICD-10-CM | POA: Insufficient documentation

## 2016-06-01 DIAGNOSIS — M62838 Other muscle spasm: Secondary | ICD-10-CM | POA: Insufficient documentation

## 2016-06-01 DIAGNOSIS — M6281 Muscle weakness (generalized): Secondary | ICD-10-CM | POA: Diagnosis not present
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
## 2016-06-06 ENCOUNTER — Ambulatory Visit: Payer: 59 | Admitting: Physical Therapy

## 2016-06-06 DIAGNOSIS — M545 Low back pain: Principal | ICD-10-CM

## 2016-06-06 DIAGNOSIS — G8929 Other chronic pain: Secondary | ICD-10-CM | POA: Diagnosis not present

## 2016-06-06 DIAGNOSIS — R293 Abnormal posture: Secondary | ICD-10-CM | POA: Diagnosis not present

## 2016-06-06 DIAGNOSIS — M62838 Other muscle spasm: Secondary | ICD-10-CM | POA: Diagnosis not present

## 2016-06-06 DIAGNOSIS — M6281 Muscle weakness (generalized): Secondary | ICD-10-CM

## 2016-06-06 NOTE — Therapy (Signed)
[place], [place], [address] Phone: [phone]   Fax:  [phone]  Physical Therapy Treatment  Patient Details  Name: Sara Briggs MRN: [id number] Date of Birth: 04/03/1966 Referring Provider: Damaris Hippo MD  Encounter Date: 06/06/2016      PT End of Session - 06/06/16 0851    Visit Number 2   Number of Visits 13   Date for PT Re-Evaluation 07/27/16   PT Start Time 0801   PT Stop Time [id number]   PT Time Calculation (min) 43 min   Activity Tolerance Patient tolerated treatment well   Behavior During Therapy [facility] for tasks assessed/performed      Past Medical History:  Diagnosis Date  . Allergic   . Allergy   . Cancer ([place])    skin cancer, basal cell carcinoma  . Diabetes mellitus without complication ([place])   . Hyperlipidemia   . IBS (irritable bowel syndrome)   . IBS (irritable bowel syndrome)   . Obesity   . Rectocele     Past Surgical History:  Procedure Laterality Date  . btl    . CESAREAN SECTION    . LAPAROSCOPY FOR ECTOPIC PREGNANCY      There were no vitals filed for this visit.      Subjective Assessment - 06/06/16 0806    Subjective "I had a challenging weekend with the back, doing the simple stuff twisting wrong"    Currently in Pain? Yes   Pain Score 4    Pain Location Back   Pain Orientation Right;Lower                         [facility] Adult PT Treatment/Exercise - 06/06/16 0807      Lumbar Exercises: Stretches   Lower Trunk Rotation --  x 10 reps hold at 1 sec     Knee/Hip Exercises: Stretches   Active Hamstring Stretch 3 reps;30 seconds  contract/ relax with 10 sec hold     Knee/Hip Exercises: Aerobic   Nustep L5 x 6 min  LE only     Knee/Hip Exercises: Supine   Straight Leg Raises Strengthening;Right;1 set;15 reps  verbal cues slow and controlled motion   Other Supine Knee/Hip Exercises clamshell 2 x 10  with red theraband   Other Supine  Knee/Hip Exercises Dead bug 4 x 10 sec hold  verbal cues to keep core tight     Manual Therapy   Manual Therapy Muscle Energy Technique   Manual therapy comments DTM over bil lumbar paraspinals, and hamstrings   Muscle Energy Technique scissoring technique with Resisted R hip fleixon and L hamstring curl                PT Education - 06/06/16 0847    Education provided Yes   Education Details posture education with proper biomechanics.    Person(s) Educated Patient   Methods Explanation;Verbal cues;Handout   Comprehension Verbalized understanding;Verbal cues required          PT Short Term Goals - 06/01/16 1453      PT SHORT TERM GOAL #1   Title She will be I with inital HEP (06/22/2016)   Time 3   Period Weeks   Status New     PT SHORT TERM GOAL #2   Title she will be able to verbalize and demo proper posture and lifting mechanics to prevent and reduce low back pain (06/22/2016)   Time 3  Period Weeks   Status New     PT SHORT TERM GOAL #3   Title she will exhibit decreased muscle spasm / tightness in the low back to promote funcitonal mobility and reduce pain to </=3/10 following working and ADLS ([id number])   Time 3   Period Weeks   Status New           PT Long Term Goals - 06/01/16 1455      PT LONG TERM GOAL #1   Title She will be I with all HEp given as of last visit ( 07/27/2016)   Time 6   Period Weeks   Status New     PT LONG TERM GOAL #2   Title She will increase hip extension/ abduction strength to >/= 4/5 with </= 1/10 pain to assist with prolong standing/ walking and functional lifting and carrying activitis (07/27/2016)   Time 6   Period Weeks   Status New     PT LONG TERM GOAL #3   Title she will be able to finish her work shift with </= 1/10 pain in the low back to increase QOL (07/27/2016)   Time 6   Period Weeks   Status New     PT LONG TERM GOAL #4   Title she will increase her FOTO score to </=30% limited to demonstrate  improvement in funciton at discharge (07/27/2016)   Time [place] - 06/06/16 [id number]    Clinical Impression Statement Mrs. Loura Back reports continued soreness in the back but had alot to do over the weekend. focused on innominate rotation with hamstring stretching, hip flexor activation on the R. and manual to calm down tightness in the low back and R hamstring. pt reported some relief of pain post session and declined modalities post session.       Patient will benefit from skilled therapeutic intervention in order to improve the following deficits and impairments:  Pain, Improper body mechanics, Postural dysfunction, Decreased endurance, Decreased activity tolerance, Increased fascial restricitons, Decreased strength, Hypomobility  Visit Diagnosis: Chronic right-sided low back pain without sciatica  Abnormal posture  Muscle weakness (generalized)  Other muscle spasm     Problem List There are no active problems to display for this patient.  Starr Lake PT, DPT, LAT, ATC  06/06/16  9:00 AM      [facility] [address] [place], [place], [address] Phone: [phone]   Fax:  [phone]  Name: RANDA FELTES MRN: [id number] Date of Birth: 1965-10-04

## 2016-06-06 NOTE — Patient Instructions (Signed)

## 2016-06-09 ENCOUNTER — Ambulatory Visit: Payer: 59 | Admitting: Physical Therapy

## 2016-06-09 DIAGNOSIS — H5213 Myopia, bilateral: Secondary | ICD-10-CM | POA: Diagnosis not present

## 2016-06-14 ENCOUNTER — Ambulatory Visit: Payer: 59 | Admitting: Physical Therapy

## 2016-06-16 ENCOUNTER — Ambulatory Visit: Payer: 59

## 2016-06-16 DIAGNOSIS — G8929 Other chronic pain: Secondary | ICD-10-CM

## 2016-06-16 DIAGNOSIS — M545 Low back pain: Secondary | ICD-10-CM | POA: Diagnosis not present

## 2016-06-16 DIAGNOSIS — R293 Abnormal posture: Secondary | ICD-10-CM | POA: Diagnosis not present

## 2016-06-16 DIAGNOSIS — M6281 Muscle weakness (generalized): Secondary | ICD-10-CM

## 2016-06-16 DIAGNOSIS — M62838 Other muscle spasm: Secondary | ICD-10-CM | POA: Diagnosis not present

## 2016-06-16 NOTE — Therapy (Signed)
[place], [place], [address] Phone: [phone]   Fax:  [phone]  Physical Therapy Treatment  Patient Details  Name: Sara Briggs MRN: [id number] Date of Birth: Aug 06, 1965 Referring Provider: Damaris Hippo MD  Encounter Date: 06/16/2016      PT End of Session - 06/16/16 1544    Visit Number 3   Number of Visits 13   Date for PT Re-Evaluation 07/27/16   PT Start Time 0300   PT Stop Time 0400   PT Time Calculation (min) 60 min   Activity Tolerance Patient tolerated treatment well   Behavior During Therapy [facility] for tasks assessed/performed      Past Medical History:  Diagnosis Date  . Allergic   . Allergy   . Cancer ([place])    skin cancer, basal cell carcinoma  . Diabetes mellitus without complication ([place])   . Hyperlipidemia   . IBS (irritable bowel syndrome)   . IBS (irritable bowel syndrome)   . Obesity   . Rectocele     Past Surgical History:  Procedure Laterality Date  . btl    . CESAREAN SECTION    . LAPAROSCOPY FOR ECTOPIC PREGNANCY      There were no vitals filed for this visit.      Subjective Assessment - 06/16/16 1505    Subjective Walked yesterday. 4/10 pain RT > LT lower lumbar   Currently in Pain? Yes   Pain Score 4    Pain Location Back   Pain Orientation Right;Lower   Pain Descriptors / Indicators Throbbing;Tightness;Sore   Pain Type Chronic pain   Pain Radiating Towards Rt glute   Pain Onset More than a month ago   Pain Frequency Intermittent   Aggravating Factors  prolonged stand/sit, walk ,  also twisting/lift/work   Pain Relieving Factors meds ,ice/heat   Multiple Pain Sites No                         [facility] Adult PT Treatment/Exercise - 06/16/16 0001      Lumbar Exercises: Stretches   Passive Hamstring Stretch 1 rep;60 seconds  with strap RT /LT   Single Knee to Chest Stretch 30 seconds;2 reps   Lower Trunk Rotation 2 reps;30 seconds   0ne set with RT knee on LT , one set Lt on RT   Pelvic Tilt --  10 reps 1-2 sec      Lumbar Exercises: Prone   Other Prone Lumbar Exercises multifidus pubic press x 10 5 se then x8 with knee flexion     Knee/Hip Exercises: Aerobic   Nustep L5 x 6 min LE/UE     Knee/Hip Exercises: Supine   Bridges with Ball Squeeze Both;15 reps   Bridges with Clamshell Strengthening;Both;15 reps  red band   Other Supine Knee/Hip Exercises Dead bug 4 x 10 sec hold     Modalities   Modalities Moist Heat     Moist Heat Therapy   Number Minutes Moist Heat 15 Minutes   Moist Heat Location Lumbar Spine  sacrum     Manual Therapy   Manual Therapy Joint mobilization   Manual therapy comments DTM over bil lumbar paraspinals,    Joint Mobilization PA glides to T12 to S1 and to LT and RT SI joint                  PT Short Term Goals - 06/16/16 1546  PT SHORT TERM GOAL #1   Title She will be I with inital HEP (06/22/2016)   Status On-going     PT SHORT TERM GOAL #2   Title she will be able to verbalize and demo proper posture and lifting mechanics to prevent and reduce low back pain (06/22/2016)   Status On-going     PT SHORT TERM GOAL #3   Title she will exhibit decreased muscle spasm / tightness in the low back to promote funcitonal mobility and reduce pain to </=3/10 following working and ADLS ([id number])   Status On-going           PT Long Term Goals - 06/01/16 1455      PT LONG TERM GOAL #1   Title She will be I with all HEp given as of last visit ( 07/27/2016)   Time 6   Period Weeks   Status New     PT LONG TERM GOAL #2   Title She will increase hip extension/ abduction strength to >/= 4/5 with </= 1/10 pain to assist with prolong standing/ walking and functional lifting and carrying activitis (07/27/2016)   Time 6   Period Weeks   Status New     PT LONG TERM GOAL #3   Title she will be able to finish her work shift with </= 1/10 pain in the low back to increase  QOL (07/27/2016)   Time 6   Period Weeks   Status New     PT LONG TERM GOAL #4   Title she will increase her FOTO score to </=30% limited to demonstrate improvement in funciton at discharge (07/27/2016)   Time 6   Period Weeks   Status New               Plan - 06/16/16 1544    Clinical Impression Statement [name] was tender lower lumbar more L4 to S1 area, pelvis appeared level today.    PT Treatment/Interventions ADLs/Self Care Home Management;Cryotherapy;Electrical Stimulation;Iontophoresis 4mg /ml Dexamethasone;Moist Heat;Ultrasound;Dry needling;Taping;Vasopneumatic Device;Therapeutic exercise;Therapeutic activities;Patient/family education;Passive range of motion;Manual techniques   PT Next Visit Plan assess/ review HEP, hamstring stretching, posterior rotated R innominate, hip flexor activation, manual for muscle tightness, modalities PRN   PT Home Exercise Plan hamstring stretching, childs pose, SLR, lower trunk rotaiton.    Consulted and Agree with Plan of Care Patient      Patient will benefit from skilled therapeutic intervention in order to improve the following deficits and impairments:  Pain, Improper body mechanics, Postural dysfunction, Decreased endurance, Decreased activity tolerance, Increased fascial restricitons, Decreased strength, Hypomobility  Visit Diagnosis: Chronic right-sided low back pain without sciatica  Abnormal posture  Muscle weakness (generalized)  Other muscle spasm     Problem List There are no active problems to display for this patient.   Darrel Hoover  PT 06/16/2016, 3:47 PM  [facility] [address] [place], [place], [address] Phone: [phone]   Fax:  [phone]  Name: Sara Briggs MRN: [id number] Date of Birth: Dec 26, 1965

## 2016-06-21 ENCOUNTER — Ambulatory Visit: Payer: 59 | Admitting: Physical Therapy

## 2016-06-21 DIAGNOSIS — G8929 Other chronic pain: Secondary | ICD-10-CM | POA: Diagnosis not present

## 2016-06-21 DIAGNOSIS — M545 Low back pain: Secondary | ICD-10-CM | POA: Diagnosis not present

## 2016-06-21 DIAGNOSIS — M62838 Other muscle spasm: Secondary | ICD-10-CM | POA: Diagnosis not present

## 2016-06-21 DIAGNOSIS — M6281 Muscle weakness (generalized): Secondary | ICD-10-CM

## 2016-06-21 DIAGNOSIS — R293 Abnormal posture: Secondary | ICD-10-CM

## 2016-06-21 NOTE — Therapy (Signed)
[place], [place], [address] Phone: [phone]   Fax:  [phone]  Physical Therapy Treatment  Patient Details  Name: Sara Briggs MRN: [id number] Date of Birth: [birth year] Referring Provider: Damaris Hippo MD  Encounter Date: 06/21/2016      PT End of Session - 06/21/16 1101    Visit Number 4   Number of Visits 13   Date for PT Re-Evaluation 07/27/16   PT Start Time 0370   PT Stop Time 1105   PT Time Calculation (min) 50 min   Activity Tolerance Patient tolerated treatment well   Behavior During Therapy [facility] for tasks assessed/performed      Past Medical History:  Diagnosis Date  . Allergic   . Allergy   . Cancer ([place])    skin cancer, basal cell carcinoma  . Diabetes mellitus without complication ([place])   . Hyperlipidemia   . IBS (irritable bowel syndrome)   . IBS (irritable bowel syndrome)   . Obesity   . Rectocele     Past Surgical History:  Procedure Laterality Date  . btl    . CESAREAN SECTION    . LAPAROSCOPY FOR ECTOPIC PREGNANCY      There were no vitals filed for this visit.      Subjective Assessment - 06/21/16 1017    Subjective "I've been doing well, except for when I clean or vacuum"    Currently in Pain? Yes   Pain Score 2    Pain Orientation Right;Lower   Pain Descriptors / Indicators Aching   Pain Type Chronic pain   Pain Onset More than a month ago   Pain Frequency Intermittent   Aggravating Factors  using household cleaning tools,    Pain Relieving Factors meds, ice/ heat                         [facility] Adult PT Treatment/Exercise - 06/21/16 0001      Lumbar Exercises: Stretches   Passive Hamstring Stretch 3 reps;30 seconds  contract/ relax with 10 sec hold   Lower Trunk Rotation 60 seconds;2 reps     Lumbar Exercises: Prone   Other Prone Lumbar Exercises Cat/ Cow 1 x 10 holding ea position for 5 sec     Knee/Hip Exercises: Aerobic   Nustep L5 x 8 min   LE/UE     Knee/Hip Exercises: Standing   Other Standing Knee Exercises lifting/ carrying 18# from floor to Waist/ shelf and back to the floor, pushing/ pulling mimicking vacuuming stepping with vacuum cleaner   verbal/ visual cues on proper form      Knee/Hip Exercises: Supine   Straight Leg Raises Strengthening;Right;2 sets;15 reps  with 3#   Other Supine Knee/Hip Exercises Dead bug 3 x 6 reps alternating UE/LE  fatigued quickly, required multiple verbal cues for form     Moist Heat Therapy   Number Minutes Moist Heat 10 Minutes   Moist Heat Location Lumbar Spine  with pt in prone     Manual Therapy   Manual Therapy --  w   Manual therapy comments DTM over bil lumbar paraspinals,    Muscle Energy Technique scissoring technique with Resisted R hip fleixon and L hamstring curl                PT Education - 06/21/16 1100    Education provided Yes   Education Details updated HEP for core strengthening and cat/cow  Person(s) Educated Patient   Methods Explanation;Handout;Verbal cues   Comprehension Verbalized understanding;Verbal cues required          PT Short Term Goals - 06/16/16 1546      PT SHORT TERM GOAL #1   Title She will be I with inital HEP (06/22/2016)   Status On-going     PT SHORT TERM GOAL #2   Title she will be able to verbalize and demo proper posture and lifting mechanics to prevent and reduce low back pain (06/22/2016)   Status On-going     PT SHORT TERM GOAL #3   Title she will exhibit decreased muscle spasm / tightness in the low back to promote funcitonal mobility and reduce pain to </=3/10 following working and ADLS (04/30/2692)   Status On-going           PT Long Term Goals - 06/01/16 1455      PT LONG TERM GOAL #1   Title She will be I with all HEp given as of last visit ( 07/27/2016)   Time 6   Period Weeks   Status New     PT LONG TERM GOAL #2   Title She will increase hip extension/ abduction strength to  >/= 4/5 with </= 1/10 pain to assist with prolong standing/ walking and functional lifting and carrying activitis (07/27/2016)   Time 6   Period Weeks   Status New     PT LONG TERM GOAL #3   Title she will be able to finish her work shift with </= 1/10 pain in the low back to increase QOL (07/27/2016)   Time 6   Period Weeks   Status New     PT LONG TERM GOAL #4   Title she will increase her FOTO score to </=30% limited to demonstrate improvement in funciton at discharge (07/27/2016)   Time 6   Period Weeks   Status New               Plan - 06/21/16 1102    Clinical Impression Statement Mrs. Yera reports improvement in the low back. Focuse on MET of the R with resisted hip flexion. core strengthening and proper lifting/ carrying mechanics with verbal cues on form.    Rehab Potential Good   PT Next Visit Plan assess/ review HEP, hamstring stretching, posterior rotated R innominate, hip flexor activation, manual for muscle tightness, modalities PRN   PT Home Exercise Plan hamstring stretching, childs pose, SLR, lower trunk rotaiton, childs pose, cat/cow   Consulted and Agree with Plan of Care Patient      Patient will benefit from skilled therapeutic intervention in order to improve the following deficits and impairments:  Pain, Improper body mechanics, Postural dysfunction, Decreased endurance, Decreased activity tolerance, Increased fascial restricitons, Decreased strength, Hypomobility  Visit Diagnosis: Chronic right-sided low back pain without sciatica  Abnormal posture  Muscle weakness (generalized)  Other muscle spasm     Problem List There are no active problems to display for this patient.  Starr Lake PT, DPT, LAT, ATC  06/21/16  11:09 AM      [facility] [address] [place], [place], [address] Phone: [phone]   Fax:  [phone]  Name: Sara Briggs MRN: [id number] Date of Birth:  [birth year]

## 2016-06-23 ENCOUNTER — Ambulatory Visit: Payer: 59 | Attending: Family Medicine | Admitting: Physical Therapy

## 2016-06-23 DIAGNOSIS — G8929 Other chronic pain: Secondary | ICD-10-CM | POA: Insufficient documentation

## 2016-06-23 DIAGNOSIS — M62838 Other muscle spasm: Secondary | ICD-10-CM | POA: Diagnosis not present

## 2016-06-23 DIAGNOSIS — R293 Abnormal posture: Secondary | ICD-10-CM | POA: Diagnosis not present

## 2016-06-23 DIAGNOSIS — M6281 Muscle weakness (generalized): Secondary | ICD-10-CM | POA: Insufficient documentation

## 2016-06-23 DIAGNOSIS — M545 Low back pain: Secondary | ICD-10-CM | POA: Insufficient documentation

## 2016-06-23 NOTE — Therapy (Signed)
[place], [place], [address] Phone: [phone]   Fax:  [phone]  Physical Therapy Treatment  Patient Details  Name: Sara Briggs MRN: [id number] Date of Birth: December 04, 1965 Referring Provider: Damaris Hippo MD  Encounter Date: 06/23/2016    Past Medical History:  Diagnosis Date  . Allergic   . Allergy   . Cancer ([place])    skin cancer, basal cell carcinoma  . Diabetes mellitus without complication ([place])   . Hyperlipidemia   . IBS (irritable bowel syndrome)   . IBS (irritable bowel syndrome)   . Obesity   . Rectocele     Past Surgical History:  Procedure Laterality Date  . btl    . CESAREAN SECTION    . LAPAROSCOPY FOR ECTOPIC PREGNANCY      There were no vitals filed for this visit.      Subjective Assessment - 06/23/16 1022    Subjective "since last session things have been going okay"    Currently in Pain? Yes   Pain Score 1    Pain Orientation Right;Lower   Pain Type Chronic pain   Pain Onset More than a month ago   Pain Frequency Intermittent                         [facility] Adult PT Treatment/Exercise - 06/23/16 0001      Lumbar Exercises: Prone   Other Prone Lumbar Exercises Cat/ Cow 1 x 10 holding ea position for 5 sec     Knee/Hip Exercises: Stretches   Active Hamstring Stretch 3 reps;30 seconds     Knee/Hip Exercises: Aerobic   Nustep L5 x 8 min  LE only     Knee/Hip Exercises: Standing   Other Standing Knee Exercises lift/ chop 2 x 10 ea bil  with Red theraband, verbal cues for form     Knee/Hip Exercises: Supine   Straight Leg Raises Strengthening;Right;1 set  25 reps with 3#   Other Supine Knee/Hip Exercises 4 x 20 sec hold      Manual Therapy   Manual therapy comments DTM over R biceps femoris distally and proximally   Muscle Energy Technique prone R hip resisted flexion with R innominated gr 3 anterior mob                  PT  Short Term Goals - 06/16/16 1546      PT SHORT TERM GOAL #1   Title She will be I with inital HEP (06/22/2016)   Status On-going     PT SHORT TERM GOAL #2   Title she will be able to verbalize and demo proper posture and lifting mechanics to prevent and reduce low back pain (06/22/2016)   Status On-going     PT SHORT TERM GOAL #3   Title she will exhibit decreased muscle spasm / tightness in the low back to promote funcitonal mobility and reduce pain to </=3/10 following working and ADLS (0/97/3532)   Status On-going           PT Long Term Goals - 06/01/16 1455      PT LONG TERM GOAL #1   Title She will be I with all HEp given as of last visit ( 07/27/2016)   Time 6   Period Weeks   Status New     PT LONG TERM GOAL #2   Title She will increase hip extension/ abduction strength to >/= 4/5  with </= 1/10 pain to assist with prolong standing/ walking and functional lifting and carrying activitis (07/27/2016)   Time 6   Period Weeks   Status New     PT LONG TERM GOAL #3   Title she will be able to finish her work shift with </= 1/10 pain in the low back to increase QOL (07/27/2016)   Time 6   Period Weeks   Status New     PT LONG TERM GOAL #4   Title she will increase her FOTO score to </=30% limited to demonstrate improvement in funciton at discharge (07/27/2016)   Time 6   Period Weeks   Status New               Plan - 06/23/16 1054    Clinical Impression Statement Mrs. Fife continues to demonstrate improvement in pain and trunk mobility. Continued on MET usign R resisted hip flexion and soft tissue work over the R hamstring to relieve tenison. She reported decreased pain and tighntess post session and declined modalities.    PT Next Visit Plan assess/ review HEP, hamstring stretching, posterior rotated R innominate, hip flexor activation, manual for muscle tightness, modalities PRN   Consulted and Agree with Plan of Care Patient      Patient will benefit from  skilled therapeutic intervention in order to improve the following deficits and impairments:  Pain, Improper body mechanics, Postural dysfunction, Decreased endurance, Decreased activity tolerance, Increased fascial restricitons, Decreased strength, Hypomobility  Visit Diagnosis: Chronic right-sided low back pain without sciatica  Abnormal posture  Muscle weakness (generalized)  Other muscle spasm     Problem List There are no active problems to display for this patient.  Starr Lake PT, DPT, LAT, ATC  06/23/16  10:57 AM      [facility] [address] [place], [place], [address] Phone: [phone]   Fax:  [phone]  Name: Sara Briggs MRN: [id number] Date of Birth: [birth year]

## 2016-06-27 ENCOUNTER — Ambulatory Visit: Payer: 59 | Admitting: Physical Therapy

## 2016-06-27 DIAGNOSIS — M6281 Muscle weakness (generalized): Secondary | ICD-10-CM

## 2016-06-27 DIAGNOSIS — M545 Low back pain: Principal | ICD-10-CM

## 2016-06-27 DIAGNOSIS — G8929 Other chronic pain: Secondary | ICD-10-CM

## 2016-06-27 DIAGNOSIS — R293 Abnormal posture: Secondary | ICD-10-CM | POA: Diagnosis not present

## 2016-06-27 DIAGNOSIS — M62838 Other muscle spasm: Secondary | ICD-10-CM | POA: Diagnosis not present

## 2016-06-27 NOTE — Therapy (Signed)
[place], [place], [address] Phone: [phone]   Fax:  [phone]  Physical Therapy Treatment / Discharge Note  Patient Details  Name: Sara Briggs MRN: [id number] Date of Birth: October 27, 1965 Referring Provider: Damaris Hippo MD  Encounter Date: 06/27/2016      PT End of Session - 06/27/16 1029    Visit Number 5   Number of Visits 13   Date for PT Re-Evaluation 07/27/16   PT Start Time 1017   PT Stop Time 1045   PT Time Calculation (min) 28 min   Activity Tolerance Patient tolerated treatment well   Behavior During Therapy [facility] for tasks assessed/performed      Past Medical History:  Diagnosis Date  . Allergic   . Allergy   . Cancer ([place])    skin cancer, basal cell carcinoma  . Diabetes mellitus without complication ([place])   . Hyperlipidemia   . IBS (irritable bowel syndrome)   . IBS (irritable bowel syndrome)   . Obesity   . Rectocele     Past Surgical History:  Procedure Laterality Date  . btl    . CESAREAN SECTION    . LAPAROSCOPY FOR ECTOPIC PREGNANCY      There were no vitals filed for this visit.      Subjective Assessment - 06/27/16 1018    Subjective "I am doing better, and my back is much better"    Currently in Pain? Yes   Pain Score 1    Pain Location Back   Pain Orientation Right;Lower   Pain Descriptors / Indicators Sore   Pain Onset More than a month ago   Pain Frequency Occasional            [facility] PT Assessment - 06/27/16 0001      Observation/Other Assessments   Focus on Therapeutic Outcomes (FOTO)  17% limited     AROM   Lumbar Flexion 110   Lumbar Extension 33   Lumbar - Right Side Bend 38   Lumbar - Left Side Bend 38     Strength   Right Hip Extension 4-/5   Right Hip ABduction 4/5   Left Hip Extension 4-/5   Left Hip ABduction 4/5                             PT Education - 06/27/16 1048    Education provided Yes   Education Details discussed pt knee pain and pro's cons of bracing. reviewed previously provided HEP and how to progress in order to continue with strengthening and functional progress. reviewed posture   Person(s) Educated Patient   Methods Explanation;Verbal cues   Comprehension Verbalized understanding;Verbal cues required          PT Short Term Goals - 06/27/16 1034      PT SHORT TERM GOAL #1   Title She will be I with inital HEP (06/22/2016)   Time 3   Period Weeks   Status Achieved     PT SHORT TERM GOAL #2   Title she will be able to verbalize and demo proper posture and lifting mechanics to prevent and reduce low back pain (06/22/2016)   Time 3   Period Weeks   Status Achieved     PT SHORT TERM GOAL #3   Title she will exhibit decreased muscle spasm / tightness in the low back to promote funcitonal mobility and reduce pain to </=3/10  following working and ADLS (05/04/2109)   Period Weeks   Status Achieved           PT Long Term Goals - 06/27/16 1034      PT LONG TERM GOAL #1   Title She will be I with all HEp given as of last visit ( 07/27/2016)   Time 6   Period Weeks   Status Achieved     PT LONG TERM GOAL #2   Title She will increase hip extension/ abduction strength to >/= 4/5 with </= 1/10 pain to assist with prolong standing/ walking and functional lifting and carrying activitis (07/27/2016)   Baseline hip extension is 4-/5   Time 6   Period Weeks   Status Partially Met     PT LONG TERM GOAL #3   Title she will be able to finish her work shift with </= 1/10 pain in the low back to increase QOL (07/27/2016)   Time 6   Period Weeks   Status Achieved     PT LONG TERM GOAL #4   Title she will increase her FOTO score to </=30% limited to demonstrate improvement in funciton at discharge (07/27/2016)   Time 6   Period Weeks   Status Achieved               Plan - 06/27/16 1049    Clinical Impression Statement Mrs. Rinkenberger reports minimal soreness in the  R low back. She has demonstrated improvement of trunk mobility and LE strength. She stated she has more pain in the R knee which appears to be located at the pes anserine with soreness during L hamstring strethcing indicating possible bursae involvement. she has met or partially met all goals. she reports/ demonstrate ability to maintain and progress her current level of function independently and will be discharged from PT today.    PT Next Visit Plan D/C   PT Home Exercise Plan hamstring stretching, childs pose, SLR, lower trunk rotaiton, childs pose, cat/cow   Consulted and Agree with Plan of Care Patient      Patient will benefit from skilled therapeutic intervention in order to improve the following deficits and impairments:  Pain, Improper body mechanics, Postural dysfunction, Decreased endurance, Decreased activity tolerance, Increased fascial restricitons, Decreased strength, Hypomobility  Visit Diagnosis: Chronic right-sided low back pain without sciatica  Abnormal posture  Muscle weakness (generalized)  Other muscle spasm     Problem List There are no active problems to display for this patient.  Starr Lake PT, DPT, LAT, ATC  06/27/16  10:55 AM      [facility] [address] [place], [place], [address] Phone: [phone]   Fax:  [phone]  Name: Sara Briggs MRN: [id number] Date of Birth: 24-Apr-1966        PHYSICAL THERAPY DISCHARGE SUMMARY  Visits from Start of Care: 5  Current functional level related to goals / functional outcomes: FOTO 17% limited   Remaining deficits: See assessment above.    Education / Equipment: HEP, theraband, posture, lifting/ carrying biomechanics.   Plan: Patient agrees to discharge.  Patient goals were partially met. Patient is being discharged due to being pleased with the current functional level.  ?????    Kristoffer Leamon PT, DPT, LAT, ATC  06/27/16   10:55 AM

## 2016-06-29 ENCOUNTER — Ambulatory Visit: Payer: 59 | Admitting: Physical Therapy

## 2016-06-30 ENCOUNTER — Encounter: Payer: Self-pay | Admitting: Physical Therapy

## 2016-07-20 MED FILL — FREESTYLE LANCETS: 90 days supply | Qty: 100 | Fill #0

## 2016-07-20 MED FILL — FLUTICASONE PROP 50 MCG SPR: 50 | 30 days supply | Qty: 16 | Fill #0

## 2016-07-20 MED FILL — FREESTYLE LITE METER: 30 days supply | Qty: 1 | Fill #0

## 2016-07-20 MED FILL — FREESTYLE LITE TEST STRIP: 90 days supply | Qty: 100 | Fill #0

## 2016-08-01 ENCOUNTER — Other Ambulatory Visit: Payer: Self-pay | Admitting: Physician Assistant

## 2016-08-01 ENCOUNTER — Other Ambulatory Visit (HOSPITAL_COMMUNITY)
Admission: RE | Admit: 2016-08-01 | Discharge: 2016-08-01 | Disposition: A | Discharge status: Home / Self Care | Payer: 59 | Source: Ambulatory Visit | Attending: Physician Assistant | Admitting: Physician Assistant

## 2016-08-01 DIAGNOSIS — Z113 Encounter for screening for infections with a predominantly sexual mode of transmission: Secondary | ICD-10-CM | POA: Insufficient documentation

## 2016-08-01 DIAGNOSIS — N950 Postmenopausal bleeding: Secondary | ICD-10-CM | POA: Diagnosis not present

## 2016-08-01 DIAGNOSIS — Z1151 Encounter for screening for human papillomavirus (HPV): Secondary | ICD-10-CM | POA: Insufficient documentation

## 2016-08-09 DIAGNOSIS — N950 Postmenopausal bleeding: Secondary | ICD-10-CM | POA: Diagnosis not present

## 2016-08-09 LAB — CYTOLOGY - PAP
BACTERIAL VAGINITIS: NEGATIVE
CHLAMYDIA, DNA PROBE: NEGATIVE
Candida vaginitis: NEGATIVE
DIAGNOSIS: NEGATIVE
HPV: NOT DETECTED
NEISSERIA GONORRHEA: NEGATIVE
Trichomonas: NEGATIVE

## 2016-08-16 DIAGNOSIS — E785 Hyperlipidemia, unspecified: Secondary | ICD-10-CM | POA: Diagnosis not present

## 2016-08-16 DIAGNOSIS — E119 Type 2 diabetes mellitus without complications: Secondary | ICD-10-CM | POA: Diagnosis not present

## 2016-08-16 DIAGNOSIS — Z0000 Encounter for general adult medical examination without abnormal findings: Secondary | ICD-10-CM | POA: Diagnosis not present

## 2016-08-16 DIAGNOSIS — N950 Postmenopausal bleeding: Secondary | ICD-10-CM | POA: Diagnosis not present

## 2016-08-16 DIAGNOSIS — Z800 Family history of malignant neoplasm of digestive organs: Secondary | ICD-10-CM | POA: Diagnosis not present

## 2016-08-16 DIAGNOSIS — M545 Low back pain: Secondary | ICD-10-CM | POA: Diagnosis not present

## 2016-08-16 DIAGNOSIS — F5101 Primary insomnia: Secondary | ICD-10-CM | POA: Diagnosis not present

## 2016-08-16 MED FILL — ZOLPIDEM TARTRATE 10 MG TAB: 10 | 30 days supply | Qty: 30 | Fill #0

## 2016-08-16 MED FILL — ESZOPICLONE 3 MG TABLET: 3 | 90 days supply | Qty: 90 | Fill #0

## 2016-09-20 DIAGNOSIS — N950 Postmenopausal bleeding: Secondary | ICD-10-CM | POA: Diagnosis not present

## 2016-09-20 DIAGNOSIS — H40013 Open angle with borderline findings, low risk, bilateral: Secondary | ICD-10-CM | POA: Diagnosis not present

## 2016-09-21 MED FILL — traMADol HCL 50 MG TABS: 50 | 5 days supply | Qty: 20 | Fill #0

## 2016-09-21 MED FILL — CLOBETASOL 0.05% OINTMENT: 0.05 | 14 days supply | Qty: 30 | Fill #0

## 2016-09-21 MED FILL — PROCTO-MED HC 2.5% CREAM: 2.5 | 30 days supply | Qty: 60 | Fill #0

## 2016-09-22 DIAGNOSIS — E119 Type 2 diabetes mellitus without complications: Secondary | ICD-10-CM | POA: Diagnosis not present

## 2016-09-22 DIAGNOSIS — N950 Postmenopausal bleeding: Secondary | ICD-10-CM | POA: Diagnosis not present

## 2016-09-22 DIAGNOSIS — N8501 Benign endometrial hyperplasia: Secondary | ICD-10-CM | POA: Diagnosis not present

## 2016-09-29 DIAGNOSIS — H6123 Impacted cerumen, bilateral: Secondary | ICD-10-CM | POA: Diagnosis not present

## 2016-12-08 DIAGNOSIS — E119 Type 2 diabetes mellitus without complications: Secondary | ICD-10-CM | POA: Diagnosis not present

## 2016-12-08 DIAGNOSIS — H40013 Open angle with borderline findings, low risk, bilateral: Secondary | ICD-10-CM | POA: Diagnosis not present

## 2016-12-08 DIAGNOSIS — R300 Dysuria: Secondary | ICD-10-CM | POA: Diagnosis not present

## 2016-12-08 DIAGNOSIS — E785 Hyperlipidemia, unspecified: Secondary | ICD-10-CM | POA: Diagnosis not present

## 2016-12-08 DIAGNOSIS — M545 Low back pain: Secondary | ICD-10-CM | POA: Diagnosis not present

## 2016-12-08 DIAGNOSIS — G4700 Insomnia, unspecified: Secondary | ICD-10-CM | POA: Diagnosis not present

## 2016-12-23 MED FILL — FLUTICASONE PROP 50 MCG SPR: 50 | 30 days supply | Qty: 16 | Fill #1

## 2016-12-23 MED FILL — ESZOPICLONE 3 MG TABS: 3 | 90 days supply | Qty: 90 | Fill #1

## 2017-02-01 MED FILL — FLUTICASONE PROP 50 MCG SPR: 50 | 30 days supply | Qty: 16 | Fill #0

## 2017-03-23 DIAGNOSIS — L820 Inflamed seborrheic keratosis: Secondary | ICD-10-CM | POA: Diagnosis not present

## 2017-03-23 DIAGNOSIS — L578 Other skin changes due to chronic exposure to nonionizing radiation: Secondary | ICD-10-CM | POA: Diagnosis not present

## 2017-03-23 DIAGNOSIS — L814 Other melanin hyperpigmentation: Secondary | ICD-10-CM | POA: Diagnosis not present

## 2017-03-23 MED FILL — ZOLPIDEM TARTRATE 10 MG TAB: 10 | 30 days supply | Qty: 30 | Fill #0

## 2017-04-10 DIAGNOSIS — H903 Sensorineural hearing loss, bilateral: Secondary | ICD-10-CM | POA: Diagnosis not present

## 2017-04-11 MED FILL — ESZOPICLONE 3 MG TABS: 3 | 90 days supply | Qty: 90 | Fill #0

## 2017-05-06 MED FILL — FLUTICASONE PROP 50 MCG SPR: 50 | 30 days supply | Qty: 16 | Fill #1

## 2017-05-09 MED FILL — ONDANSETRON ODT 8 MG TABLET: 8 | 10 days supply | Qty: 30 | Fill #0

## 2017-05-09 MED FILL — MECLIZINE 25 MG TABLET: 25 | 8 days supply | Qty: 30 | Fill #0

## 2017-05-15 DIAGNOSIS — H903 Sensorineural hearing loss, bilateral: Secondary | ICD-10-CM | POA: Diagnosis not present

## 2017-05-15 DIAGNOSIS — R42 Dizziness and giddiness: Secondary | ICD-10-CM | POA: Diagnosis not present

## 2017-05-15 DIAGNOSIS — H9313 Tinnitus, bilateral: Secondary | ICD-10-CM | POA: Diagnosis not present

## 2017-05-24 DIAGNOSIS — H9313 Tinnitus, bilateral: Secondary | ICD-10-CM | POA: Diagnosis not present

## 2017-05-24 DIAGNOSIS — R42 Dizziness and giddiness: Secondary | ICD-10-CM | POA: Diagnosis not present

## 2017-05-24 DIAGNOSIS — E119 Type 2 diabetes mellitus without complications: Secondary | ICD-10-CM | POA: Diagnosis not present

## 2017-05-24 DIAGNOSIS — Z822 Family history of deafness and hearing loss: Secondary | ICD-10-CM | POA: Diagnosis not present

## 2017-05-24 DIAGNOSIS — H9193 Unspecified hearing loss, bilateral: Secondary | ICD-10-CM | POA: Diagnosis not present

## 2017-05-31 ENCOUNTER — Encounter: Payer: Self-pay | Admitting: Nurse Practitioner

## 2017-05-31 ENCOUNTER — Ambulatory Visit: Payer: Self-pay | Admitting: Nurse Practitioner

## 2017-05-31 VITALS — BP 110/80 | HR 68 | Temp 97.5°F | Wt 179.2 lb

## 2017-05-31 DIAGNOSIS — N3001 Acute cystitis with hematuria: Secondary | ICD-10-CM

## 2017-05-31 LAB — POC URINALSYSI DIPSTICK (AUTOMATED)
BILIRUBIN UA: NEGATIVE
Glucose, UA: NEGATIVE
KETONES UA: NEGATIVE
Nitrite, UA: NEGATIVE
PH UA: 6 (ref 5.0–8.0)
PROTEIN UA: NEGATIVE
Urobilinogen, UA: NEGATIVE E.U./dL — AB

## 2017-05-31 MED ORDER — CIPROFLOXACIN HCL 500 MG PO TABS: 500.0000 mg | ORAL_TABLET | Freq: Two times a day (BID) | ORAL | 0 refills | Status: DC

## 2017-05-31 MED FILL — CIPROFLOXACIN HCL 500 MG TA: 500 | 5 days supply | Qty: 10 | Fill #0

## 2017-05-31 NOTE — Progress Notes (Signed)
   Subjective:    Patient ID: Sara Briggs, female    DOB: 04/26/1965, 52 y.o.   MRN: [id number]  HPI  Patient comes in  Today with c/o urinary frequence and urgency. Only voidind a scant amount at a time. Having some bladder spasms. She is having trouble leaving [place] a specimen.   Review of Systems  Constitutional: Negative.   HENT: Negative.   Respiratory: Negative.   Cardiovascular: Negative.   Genitourinary: Positive for dysuria, frequency, pelvic pain and urgency.  Neurological: Negative.   Psychiatric/Behavioral: Negative.   All other systems reviewed and are negative.      Objective:   Physical Exam  Constitutional: She is oriented to person, place, and time. She appears well-developed and well-nourished. She appears distressed (mild).  Cardiovascular: Normal rate.  Pulmonary/Chest: Effort normal and breath sounds normal.  Abdominal: Soft. Bowel sounds are normal. There is tenderness.  Genitourinary:  Genitourinary Comments: No CVA tenderness  Neurological: She is alert and oriented to person, place, and time.  Skin: Skin is warm.  Psychiatric: She has a normal mood and affect. Her behavior is normal. Judgment and thought content normal.   BP 110/80   Pulse 68   Temp (!) 97.5 F (36.4 C)   Wt 179 lb 3.2 oz (81.3 kg)   LMP 10/01/2012   SpO2 98%   BMI 31.74 kg/m   UA Positive leuks and positive blood      Assessment & Plan:  1. Acute cystitis with hematuria Take medication as prescribe Cotton underwear Take shower not bath Cranberry juice, yogurt Force fluids AZO over the counter X2 days RTO prn  - ciprofloxacin (CIPRO) 500 MG tablet; Take 1 tablet (500 mg total) by mouth 2 (two) times daily.  Dispense: 10 tablet; Refill: 0  Mary-Margaret Hassell Done, FNP

## 2017-05-31 NOTE — Patient Instructions (Signed)
Take medication as prescribe Cotton underwear Take shower not bath Cranberry juice, yogurt Force fluids AZO over the counter X2 days RTO prn   

## 2017-06-08 ENCOUNTER — Other Ambulatory Visit: Payer: Self-pay | Admitting: Family Medicine

## 2017-06-08 DIAGNOSIS — Z1231 Encounter for screening mammogram for malignant neoplasm of breast: Secondary | ICD-10-CM

## 2017-06-19 DIAGNOSIS — E119 Type 2 diabetes mellitus without complications: Secondary | ICD-10-CM | POA: Diagnosis not present

## 2017-06-28 DIAGNOSIS — N3000 Acute cystitis without hematuria: Secondary | ICD-10-CM | POA: Diagnosis not present

## 2017-06-28 DIAGNOSIS — N390 Urinary tract infection, site not specified: Secondary | ICD-10-CM | POA: Diagnosis not present

## 2017-06-28 MED FILL — NITROFURANTOIN MONO-MCR 100: 100 | 5 days supply | Qty: 10 | Fill #0

## 2017-06-29 ENCOUNTER — Ambulatory Visit
Admission: RE | Admit: 2017-06-29 | Discharge: 2017-06-29 | Disposition: A | Discharge status: Home / Self Care | Payer: 59 | Source: Ambulatory Visit | Attending: Family Medicine | Admitting: Family Medicine

## 2017-06-29 DIAGNOSIS — Z1231 Encounter for screening mammogram for malignant neoplasm of breast: Secondary | ICD-10-CM | POA: Diagnosis not present

## 2017-07-24 MED FILL — ZOLPIDEM TARTRATE 10 MG TAB: 10 | 30 days supply | Qty: 30 | Fill #0

## 2017-08-22 DIAGNOSIS — M545 Low back pain: Secondary | ICD-10-CM | POA: Diagnosis not present

## 2017-08-22 DIAGNOSIS — G4700 Insomnia, unspecified: Secondary | ICD-10-CM | POA: Diagnosis not present

## 2017-08-22 DIAGNOSIS — Z0000 Encounter for general adult medical examination without abnormal findings: Secondary | ICD-10-CM | POA: Diagnosis not present

## 2017-08-22 DIAGNOSIS — L900 Lichen sclerosus et atrophicus: Secondary | ICD-10-CM | POA: Diagnosis not present

## 2017-08-22 DIAGNOSIS — Z800 Family history of malignant neoplasm of digestive organs: Secondary | ICD-10-CM | POA: Diagnosis not present

## 2017-08-22 DIAGNOSIS — R42 Dizziness and giddiness: Secondary | ICD-10-CM | POA: Diagnosis not present

## 2017-08-22 DIAGNOSIS — H903 Sensorineural hearing loss, bilateral: Secondary | ICD-10-CM | POA: Diagnosis not present

## 2017-08-22 DIAGNOSIS — E785 Hyperlipidemia, unspecified: Secondary | ICD-10-CM | POA: Diagnosis not present

## 2017-08-22 DIAGNOSIS — E119 Type 2 diabetes mellitus without complications: Secondary | ICD-10-CM | POA: Diagnosis not present

## 2017-08-22 MED FILL — ESZOPICLONE 3 MG TABS: 3 | 90 days supply | Qty: 90 | Fill #0

## 2017-08-22 MED FILL — FLUTICASONE PROP 50 MCG SPR: 50 | 30 days supply | Qty: 16 | Fill #2

## 2017-09-12 DIAGNOSIS — L820 Inflamed seborrheic keratosis: Secondary | ICD-10-CM | POA: Diagnosis not present

## 2017-09-12 DIAGNOSIS — L814 Other melanin hyperpigmentation: Secondary | ICD-10-CM | POA: Diagnosis not present

## 2017-09-12 DIAGNOSIS — L821 Other seborrheic keratosis: Secondary | ICD-10-CM | POA: Diagnosis not present

## 2017-10-05 ENCOUNTER — Ambulatory Visit (INDEPENDENT_AMBULATORY_CARE_PROVIDER_SITE_OTHER): Payer: 59 | Admitting: Gastroenterology

## 2017-10-05 ENCOUNTER — Encounter: Payer: Self-pay | Admitting: Gastroenterology

## 2017-10-05 VITALS — BP 90/60 | HR 72 | Ht 63.0 in | Wt 180.5 lb

## 2017-10-05 DIAGNOSIS — K648 Other hemorrhoids: Secondary | ICD-10-CM

## 2017-10-05 DIAGNOSIS — K625 Hemorrhage of anus and rectum: Secondary | ICD-10-CM | POA: Diagnosis not present

## 2017-10-05 DIAGNOSIS — K581 Irritable bowel syndrome with constipation: Secondary | ICD-10-CM

## 2017-10-05 NOTE — Progress Notes (Signed)
[facility] Consult Note:  History: Sara Briggs 10/05/2017  Referring physician: Harlan Stains, MD  Reason for consult/chief complaint: Family  Hx Of Colon Cancer (MGF and MU); Constipation (and have to strain); and Rectal Bleeding (BRB on the toilet paper and streak in the bowel movement)   Subjective  HPI:  This is a very pleasant 52 year old woman referred by primary care for constipation, rectal bleeding and a family history of colon cancer.Sara Briggs had a normal colonoscopy done by Dr. Deatra Briggs on 08/11/2011 because of her family history of colon cancer in maternal grandfather. She reports having IBS, and most of the time having chronic constipation for which she takes dietary treatments and also occasional magnesium.  Sometimes she will then have loose bowel movements, and says this is been her pattern for many years.  Also for years she will have occasional blood on the paper, less often in the toilet bowl.  Sometimes it will not bother her for weeks, then she will seem to have a "flare" of the bleeding.  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  She denies chronic abdominal pain, nausea, vomiting, early satiety, anorexia or weight loss. Sara Briggs reports having had what sounds like a perineal surgery or perhaps rectocele repair after childbirth.  She was told by that surgeon she may develop problems with defecation afterwards.  ROS:  Review of Systems She denies chest pain dyspnea or dysuria  Past Medical History: Past Medical History:  Diagnosis Date  . Allergic   . Allergy   . Cancer ([place])    skin cancer, basal cell carcinoma  . Diabetes mellitus without complication ([place])   . Hyperlipidemia   . IBS (irritable bowel syndrome)   . IBS (irritable bowel syndrome)   . Obesity   . Rectocele      Past Surgical History: Past Surgical History:  Procedure Laterality Date  . btl    . CESAREAN SECTION    . LAPAROSCOPY FOR ECTOPIC PREGNANCY         Family History: Family History  Problem Relation Age of Onset  . Colon cancer Maternal Uncle   . Colon cancer Maternal Grandfather   . Diabetes Mother   . Hypertension Mother   . Hypertension Father   . Sleep apnea Brother   . Hyperlipidemia Brother   . Heart disease Maternal Grandmother   . Hypertension Paternal Grandmother   . Hyperlipidemia Paternal Grandmother   . CVA Paternal Grandfather   . Hypertension Paternal Grandfather   . Hyperlipidemia Paternal Grandfather   . Sleep apnea Brother   . Hearing loss Brother   . Leukemia Paternal Uncle   . Prostate cancer Maternal Uncle   . Lung cancer Maternal Aunt     Social History: Social History   Socioeconomic History  . Marital status: Married    Spouse name: Not on file  . Number of children: Not on file  . Years of education: Not on file  . Highest education level: Not on file  Occupational History  . Occupation: Archivist  Social Needs  . Financial resource strain: Not on file  . Food insecurity:    Worry: Not on file    Inability: Not on file  . Transportation needs:    Medical: Not on file    Non-medical: Not on file  Tobacco Use  . Smoking status: Never Smoker  . Smokeless tobacco: Never Used  Substance and Sexual Activity  . Alcohol use: No  . Drug  use: No  . Sexual activity: Yes    Birth control/protection: Post-menopausal  Lifestyle  . Physical activity:    Days per week: Not on file    Minutes per session: Not on file  . Stress: Not on file  Relationships  . Social connections:    Talks on phone: Not on file    Gets together: Not on file    Attends religious service: Not on file    Active member of club or organization: Not on file    Attends meetings of clubs or organizations: Not on file    Relationship status: Not on file  Other Topics Concern  . Not on file  Social History Narrative  . Not on file    Allergies: Allergies  Allergen Reactions  . Apple Swelling and Rash    Mouth  and tongue swelling  . Ancef [Cefazolin]   . Metformin And Related Nausea Only  . Onglyza [Saxagliptin] Other (See Comments)    Change in taste  . Tape Itching    "takes off skin"  . Percocet [Oxycodone-Acetaminophen] Rash  . Sulfa Antibiotics Rash  . Vicodin [Hydrocodone-Acetaminophen] Rash    Outpatient Meds: Current Outpatient Medications  Medication Sig Dispense Refill  . acetaminophen (TYLENOL) 500 MG tablet Take 500 mg by mouth every 6 (six) hours as needed for pain.    . cetirizine (ZYRTEC) 10 MG tablet Take 10 mg by mouth every evening.     . Clobetasol Propionate 0.025 % CREA Apply 1 application topically as needed.    . cyclobenzaprine (FLEXERIL) 10 MG tablet Take 10 mg by mouth 3 (three) times daily as needed for muscle spasms.    . Eszopiclone 3 MG TABS Take 1 tablet by mouth as needed.    . fluticasone (FLONASE) 50 MCG/ACT nasal spray Place 2 sprays into the nose daily.    . hydrocortisone (PROCTOSOL HC) 2.5 % rectal cream Place 1 application rectally 2 (two) times daily.    . meclizine (ANTIVERT) 25 MG tablet Take 1 tablet by mouth as needed.    . naproxen (NAPROSYN) 500 MG tablet Take 500 mg by mouth 2 (two) times daily as needed (for pain).    . ondansetron (ZOFRAN ODT) 4 MG disintegrating tablet 4mg  ODT q6 hours prn nausea/vomit 12 tablet 0  . zolpidem (AMBIEN) 5 MG tablet Take 5 mg by mouth at bedtime as needed for sleep.     Current Facility-Administered Medications  Medication Dose Route Frequency Provider Last Rate Last Dose  . triamcinolone acetonide (KENALOG) 10 MG/ML injection 10 mg  10 mg Other Once Regal, Norman S, DPM          ___________________________________________________________________ Objective   Exam:  BP 90/60 (BP Location: Left Arm, Patient Position: Sitting, Cuff Size: Normal)   Pulse 72   Ht 5\' 3"  (1.6 m) Comment: height measured without shoes  Wt 180 lb 8 oz (81.9 kg)   LMP 10/01/2012   BMI 31.97 kg/m    General: this is a(n)  well-appearing woman  Eyes: sclera anicteric, no redness  ENT: oral mucosa moist without lesions, no cervical or supraclavicular lymphadenopathy, good dentition  CV: RRR without murmur, S1/S2, no JVD, no peripheral edema  Resp: clear to auscultation bilaterally, normal RR and effort noted  GI: soft, no tenderness, with active bowel sounds. No guarding or palpable organomegaly noted.  Skin; warm and dry, no rash or jaundice noted  Neuro: awake, alert and oriented x 3. Normal gross motor function and fluent speech  Rectal: Normal external, normal sphincter tone, no fissure or tenderness, no palpable internal lesions. Anoscopy: Internal hemorrhoids  Labs:  Normal CBC and CMP with PCP 08/22/17   Assessment: Encounter Diagnoses  Name Primary?  . Rectal bleeding Yes  . Hemorrhoids, internal   . Irritable bowel syndrome with constipation     Trinetta has long-standing IBS-like altered bowel habits and intermittent hemorrhoidal bleeding.  It has not changed over years, and I do not feel she needs a colonoscopy at present.  Recommended as needed use of hydrocortisone suppository if hemorrhoids are prolapsed, flared and bleeding.  She does her best to maintain regularity with the regimen as noted above.  It sounds like if she takes too many stool softeners or laxatives, she will then have diarrhea. We had an initial discussion about hemorrhoidal banding if she wanted to pursue that.  I gave her a brochure and she would like to consider it.  I will gladly see her as the need arises, next scheduled routine colonoscopy would be around April 2023, as she is considered to be at average risk for colorectal cancer even with her family history of colon cancer in 2 second-degree relatives.  Thank you for the courtesy of this consult.  Please call me with any questions or concerns.  Nelida Meuse III  CC: Sara Stains, MD

## 2017-10-05 NOTE — Patient Instructions (Signed)
If you are age 52 or older, your body mass index should be between 23-30. Your Body mass index is 31.97 kg/m. If this is out of the aforementioned range listed, please consider follow up with your Primary Care Provider.  If you are age 68 or younger, your body mass index should be between 19-25. Your Body mass index is 31.97 kg/m. If this is out of the aformentioned range listed, please consider follow up with your Primary Care Provider.   It was a pleasure to meet you today!  Dr. Loletha Carrow

## 2017-11-13 MED FILL — ZOLPIDEM TARTRATE 10 MG TAB: 10 | 30 days supply | Qty: 30 | Fill #0

## 2017-12-06 MED FILL — ESZOPICLONE 3 MG TABS: 3 | 90 days supply | Qty: 90 | Fill #0

## 2017-12-18 DIAGNOSIS — H40013 Open angle with borderline findings, low risk, bilateral: Secondary | ICD-10-CM | POA: Diagnosis not present

## 2018-01-09 MED FILL — FLUTICASONE PROP 50 MCG SPR: 50 | 30 days supply | Qty: 16 | Fill #3

## 2018-01-10 MED FILL — PROCTOZONE-HC 2.5 % CREA: 2.5 | 24 days supply | Qty: 60 | Fill #0

## 2018-01-10 MED FILL — CLOBETASOL PROP 0.05% OINT: 0.05 | 14 days supply | Qty: 30 | Fill #0

## 2018-01-12 MED FILL — traMADol HCL 50 MG TABS: 50 | 3 days supply | Qty: 20 | Fill #0

## 2018-01-12 MED FILL — AMOXICILLIN 500 MG CAPSULE: 500 | 7 days supply | Qty: 28 | Fill #0

## 2018-01-15 MED FILL — METHYLPREDNISOLONE 4 MG TAB: 4 | 6 days supply | Qty: 21 | Fill #0

## 2018-02-20 DIAGNOSIS — R300 Dysuria: Secondary | ICD-10-CM | POA: Diagnosis not present

## 2018-02-20 DIAGNOSIS — E1169 Type 2 diabetes mellitus with other specified complication: Secondary | ICD-10-CM | POA: Diagnosis not present

## 2018-02-20 MED FILL — NITROFURANTOIN MONO-MCR 100: 100 | 7 days supply | Qty: 14 | Fill #0

## 2018-03-15 DIAGNOSIS — L719 Rosacea, unspecified: Secondary | ICD-10-CM | POA: Diagnosis not present

## 2018-03-15 DIAGNOSIS — L821 Other seborrheic keratosis: Secondary | ICD-10-CM | POA: Diagnosis not present

## 2018-03-15 DIAGNOSIS — D2239 Melanocytic nevi of other parts of face: Secondary | ICD-10-CM | POA: Diagnosis not present

## 2018-03-15 DIAGNOSIS — D225 Melanocytic nevi of trunk: Secondary | ICD-10-CM | POA: Diagnosis not present

## 2018-04-12 MED FILL — FLUTICASONE PROP 50 MCG SPR: 50 | 30 days supply | Qty: 16 | Fill #0

## 2018-04-13 MED FILL — ESZOPICLONE 3 MG TABS: 3 | 90 days supply | Qty: 90 | Fill #0

## 2018-05-15 MED FILL — ZOLPIDEM TARTRATE 10 MG TAB: 10 | 30 days supply | Qty: 30 | Fill #0

## 2018-06-27 DIAGNOSIS — R42 Dizziness and giddiness: Secondary | ICD-10-CM | POA: Diagnosis not present

## 2018-06-27 DIAGNOSIS — H903 Sensorineural hearing loss, bilateral: Secondary | ICD-10-CM | POA: Diagnosis not present

## 2018-07-16 MED FILL — ESZOPICLONE 3 MG TABS: 3 | 90 days supply | Qty: 90 | Fill #0

## 2018-07-17 MED FILL — ACCU-CHEK GUIDE TEST STRIP: 50 days supply | Qty: 50 | Fill #0

## 2018-07-23 DIAGNOSIS — M545 Low back pain: Secondary | ICD-10-CM | POA: Diagnosis not present

## 2018-07-23 DIAGNOSIS — E1169 Type 2 diabetes mellitus with other specified complication: Secondary | ICD-10-CM | POA: Diagnosis not present

## 2018-07-23 DIAGNOSIS — J301 Allergic rhinitis due to pollen: Secondary | ICD-10-CM | POA: Diagnosis not present

## 2018-07-23 DIAGNOSIS — E785 Hyperlipidemia, unspecified: Secondary | ICD-10-CM | POA: Diagnosis not present

## 2018-07-23 DIAGNOSIS — G4700 Insomnia, unspecified: Secondary | ICD-10-CM | POA: Diagnosis not present

## 2018-07-23 MED FILL — FLUTICASONE PROP 50 MCG SPR: 50 | 90 days supply | Qty: 48 | Fill #0

## 2018-08-21 MED FILL — ZOLPIDEM TARTRATE 10 MG TAB: 10 | 30 days supply | Qty: 30 | Fill #0

## 2018-08-30 DIAGNOSIS — E1169 Type 2 diabetes mellitus with other specified complication: Secondary | ICD-10-CM | POA: Diagnosis not present

## 2018-08-30 DIAGNOSIS — R946 Abnormal results of thyroid function studies: Secondary | ICD-10-CM | POA: Diagnosis not present

## 2018-08-30 DIAGNOSIS — E785 Hyperlipidemia, unspecified: Secondary | ICD-10-CM | POA: Diagnosis not present

## 2018-09-18 MED FILL — traMADol HCL 50 MG TABS: 50 | 3 days supply | Qty: 14 | Fill #0

## 2018-09-18 MED FILL — PENICILLIN VK 500 MG TABLET: 500 | 7 days supply | Qty: 28 | Fill #0

## 2018-09-27 DIAGNOSIS — N3090 Cystitis, unspecified without hematuria: Secondary | ICD-10-CM | POA: Diagnosis not present

## 2018-09-27 DIAGNOSIS — R300 Dysuria: Secondary | ICD-10-CM | POA: Diagnosis not present

## 2018-09-27 MED FILL — CIPROFLOXACIN HCL 500 MG TA: 500 | 5 days supply | Qty: 10 | Fill #0

## 2018-09-27 MED FILL — PHENAZOPYRIDINE 200 MG TAB: 200 | 2 days supply | Qty: 6 | Fill #0

## 2018-09-29 MED FILL — CEFUROXIME AXETIL 500 MG TA: 500 | 5 days supply | Qty: 10 | Fill #0

## 2018-10-01 MED FILL — FLUCONAZOLE 150 MG TABS: 150 | 7 days supply | Qty: 2 | Fill #0

## 2018-10-03 MED FILL — FLUTICASONE PROP 50 MCG SPR: 50 | 30 days supply | Qty: 16 | Fill #1

## 2018-11-04 ENCOUNTER — Encounter (HOSPITAL_BASED_OUTPATIENT_CLINIC_OR_DEPARTMENT_OTHER): Payer: Self-pay

## 2018-11-04 ENCOUNTER — Other Ambulatory Visit: Payer: Self-pay

## 2018-11-04 ENCOUNTER — Emergency Department (HOSPITAL_BASED_OUTPATIENT_CLINIC_OR_DEPARTMENT_OTHER)
Admission: EM | Admit: 2018-11-04 | Discharge: 2018-11-05 | Disposition: A | Discharge status: Home / Self Care | Payer: 59 | Attending: Emergency Medicine | Admitting: Emergency Medicine

## 2018-11-04 DIAGNOSIS — C4491 Basal cell carcinoma of skin, unspecified: Secondary | ICD-10-CM | POA: Diagnosis not present

## 2018-11-04 DIAGNOSIS — R0789 Other chest pain: Secondary | ICD-10-CM

## 2018-11-04 DIAGNOSIS — R079 Chest pain, unspecified: Secondary | ICD-10-CM | POA: Diagnosis not present

## 2018-11-04 DIAGNOSIS — Z20828 Contact with and (suspected) exposure to other viral communicable diseases: Secondary | ICD-10-CM | POA: Diagnosis not present

## 2018-11-04 DIAGNOSIS — E119 Type 2 diabetes mellitus without complications: Secondary | ICD-10-CM | POA: Diagnosis not present

## 2018-11-04 DIAGNOSIS — R002 Palpitations: Secondary | ICD-10-CM | POA: Diagnosis not present

## 2018-11-04 HISTORY — DX: Unspecified hearing loss, unspecified ear: H91.90

## 2018-11-04 NOTE — ED Triage Notes (Signed)
Pt reports chest pain x 2 hours with dizziness and lightheadedness. Pt does not radiate. Pt reports feeling like she is going to pass out.

## 2018-11-05 ENCOUNTER — Encounter (HOSPITAL_BASED_OUTPATIENT_CLINIC_OR_DEPARTMENT_OTHER): Payer: Self-pay | Admitting: Emergency Medicine

## 2018-11-05 ENCOUNTER — Emergency Department (HOSPITAL_BASED_OUTPATIENT_CLINIC_OR_DEPARTMENT_OTHER): Payer: 59

## 2018-11-05 DIAGNOSIS — R002 Palpitations: Secondary | ICD-10-CM | POA: Diagnosis not present

## 2018-11-05 DIAGNOSIS — R079 Chest pain, unspecified: Secondary | ICD-10-CM | POA: Diagnosis not present

## 2018-11-05 DIAGNOSIS — E119 Type 2 diabetes mellitus without complications: Secondary | ICD-10-CM | POA: Diagnosis not present

## 2018-11-05 DIAGNOSIS — C4491 Basal cell carcinoma of skin, unspecified: Secondary | ICD-10-CM | POA: Diagnosis not present

## 2018-11-05 LAB — CBC WITH DIFFERENTIAL/PLATELET
Abs Immature Granulocytes: 0.02 10*3/uL (ref 0.00–0.07)
Basophils Absolute: 0 10*3/uL (ref 0.0–0.1)
Basophils Relative: 0 %
Eosinophils Absolute: 0.2 10*3/uL (ref 0.0–0.5)
Eosinophils Relative: 2 %
HCT: 45.8 % (ref 36.0–46.0)
Hemoglobin: 14.5 g/dL (ref 12.0–15.0)
Immature Granulocytes: 0 %
Lymphocytes Relative: 22 %
Lymphs Abs: 2.2 10*3/uL (ref 0.7–4.0)
MCH: 31 pg (ref 26.0–34.0)
MCHC: 31.7 g/dL (ref 30.0–36.0)
MCV: 97.9 fL (ref 80.0–100.0)
Monocytes Absolute: 0.6 10*3/uL (ref 0.1–1.0)
Monocytes Relative: 6 %
Neutro Abs: 6.9 10*3/uL (ref 1.7–7.7)
Neutrophils Relative %: 70 %
Platelets: 307 10*3/uL (ref 150–400)
RBC: 4.68 MIL/uL (ref 3.87–5.11)
RDW: 13.1 % (ref 11.5–15.5)
WBC: 10 10*3/uL (ref 4.0–10.5)
nRBC: 0 % (ref 0.0–0.2)

## 2018-11-05 LAB — BASIC METABOLIC PANEL
Anion gap: 11 (ref 5–15)
BUN: 17 mg/dL (ref 6–20)
CO2: 24 mmol/L (ref 22–32)
Calcium: 9.1 mg/dL (ref 8.9–10.3)
Chloride: 104 mmol/L (ref 98–111)
Creatinine, Ser: 0.79 mg/dL (ref 0.44–1.00)
GFR calc Af Amer: >60 mL/min (ref >60)
GFR calc non Af Amer: >60 mL/min (ref >60)
Glucose, Bld: 161 mg/dL — ABNORMAL HIGH (ref 70–99)
Potassium: 3.7 mmol/L (ref 3.5–5.1)
Sodium: 139 mmol/L (ref 135–145)

## 2018-11-05 LAB — TSH: TSH: 7.216 u[IU]/mL — ABNORMAL HIGH (ref 0.350–4.500)

## 2018-11-05 LAB — TROPONIN I (HIGH SENSITIVITY)
Troponin I (High Sensitivity): 3 ng/L (ref <18)
Troponin I (High Sensitivity): 2 ng/L (ref <18)

## 2018-11-05 MED ORDER — ALUM & MAG HYDROXIDE-SIMETH 200-200-20 MG/5ML PO SUSP
30.0000 mL | Freq: Once | ORAL | Status: AC
  Administered 2018-11-05: 30 mL via ORAL
  Filled 2018-11-05: qty 30

## 2018-11-05 MED ORDER — SODIUM CHLORIDE 0.9 % IV BOLUS
500.0000 mL | Freq: Once | INTRAVENOUS | Status: AC
  Administered 2018-11-05: 500 mL via INTRAVENOUS

## 2018-11-05 MED ORDER — DIAZEPAM 5 MG PO TABS
5.0000 mg | ORAL_TABLET | Freq: Once | ORAL | Status: AC
  Administered 2018-11-05: 5 mg via ORAL
  Filled 2018-11-05: qty 1

## 2018-11-05 NOTE — ED Notes (Signed)
Pt and her husband understood dc material. NAD noted. All questions answered to satisfaction. Pt and her husband escorted to check out counter.

## 2018-11-05 NOTE — ED Notes (Signed)
ED Provider at bedside. 

## 2018-11-05 NOTE — Discharge Instructions (Addendum)
You were seen today for chest pain and palpitations.  Your work-up today in the emergency room is reassuring.  Your heart rhythm appears to be in sinus.  However, in order to fully capture possible arrhythmias, you may need a Holter monitor.  Follow-up with cardiology.  If you develop any new or worsening symptoms, you should be reevaluated immediately.  Avoid increase caffeine use and stressors.

## 2018-11-05 NOTE — ED Notes (Signed)
Called out stating she "feels funny" reporting palpitations and sensation of heart racing. VS updated, EKG performed. EDP notified.

## 2018-11-05 NOTE — ED Notes (Signed)
Patient transported to X-ray 

## 2018-11-05 NOTE — ED Provider Notes (Signed)
[facility] EMERGENCY DEPARTMENT Provider Note   CSN: [id number] Arrival date & time: 11/04/18  2229    History   Chief Complaint Chief Complaint  Patient presents with  . Chest Pain    HPI Sara Briggs is a 53 y.o. female.     HPI  This is a 53 year old female with a history of diabetes, hyperlipidemia, IBS who presents with chest pain and palpitations.  Patient reports onset of symptoms this evening.  She reports left-sided chest pressure and associated palpitations.  She states that she was sitting on her couch after dinner when the symptoms started.  They do not appear to be exertional.  She states that she feels very anxious because of the symptoms.  She intermittently has felt like she "might pass out."  She has not passed out.  She describes feeling dizzy.  Patient has a history of Mnire's disease and states that her vertigo has been present for several days but that she generally was feeling better today.  She denies any shortness of breath, leg swelling, history of blood clots.  Denies any recent coughs or fevers.  She is a Marine scientist at [employer] and is around coworkers who have had [place] exposures but in general she feels that she is low risk.  Currently she rates her pain at 6 out of 10.  Denies any dietary changes or increase caffeine use.  Past Medical History:  Diagnosis Date  . Allergic   . Allergy   . Cancer ([place])    skin cancer, basal cell carcinoma  . Diabetes mellitus without complication ([place])   . HOH (hard of hearing)   . Hyperlipidemia   . IBS (irritable bowel syndrome)   . IBS (irritable bowel syndrome)   . Obesity   . Rectocele     There are no active problems to display for this patient.   Past Surgical History:  Procedure Laterality Date  . btl    . CESAREAN SECTION    . LAPAROSCOPY FOR ECTOPIC PREGNANCY       OB History   No obstetric history on file.      Home Medications    Prior to Admission medications    Medication Sig Start Date End Date Taking? Authorizing Provider  acetaminophen (TYLENOL) 500 MG tablet Take 500 mg by mouth every 6 (six) hours as needed for pain.    [provider]  cefUROXime (CEFTIN) 500 MG tablet  09/29/18   [provider]  cetirizine (ZYRTEC) 10 MG tablet Take 10 mg by mouth every evening.     [provider]  Clobetasol Propionate 0.025 % CREA Apply 1 application topically as needed.    [provider]  cyclobenzaprine (FLEXERIL) 10 MG tablet Take 10 mg by mouth 3 (three) times daily as needed for muscle spasms.    [provider]  Eszopiclone 3 MG TABS Take 1 tablet by mouth as needed. 08/22/17   [provider]  fluticasone (FLONASE) 50 MCG/ACT nasal spray Place 2 sprays into the nose daily.    [provider]  hydrocortisone (PROCTOSOL HC) 2.5 % rectal cream Place 1 application rectally 2 (two) times daily.    [provider]  meclizine (ANTIVERT) 25 MG tablet Take 1 tablet by mouth as needed. 05/09/17   [provider]  naproxen (NAPROSYN) 500 MG tablet Take 500 mg by mouth 2 (two) times daily as needed (for pain).    [provider]  ondansetron (ZOFRAN ODT) 4  MG disintegrating tablet 4mg  ODT q6 hours prn nausea/vomit 10/15/12   Milton Ferguson, MD  phenazopyridine (PYRIDIUM) 200 MG tablet  09/27/18   [provider]  zolpidem (AMBIEN) 5 MG tablet Take 5 mg by mouth at bedtime as needed for sleep.    [provider]    Family History Family History  Problem Relation Age of Onset  . Colon cancer Maternal Uncle   . Colon cancer Maternal Grandfather   . Diabetes Mother   . Hypertension Mother   . Hypertension Father   . Sleep apnea Brother   . Hyperlipidemia Brother   . Heart disease Maternal Grandmother   . Hypertension Paternal Grandmother   . Hyperlipidemia Paternal Grandmother   . CVA Paternal Grandfather   . Hypertension Paternal Grandfather   .  Hyperlipidemia Paternal Grandfather   . Sleep apnea Brother   . Hearing loss Brother   . Leukemia Paternal Uncle   . Prostate cancer Maternal Uncle   . Lung cancer Maternal Aunt     Social History Social History   Tobacco Use  . Smoking status: Never Smoker  . Smokeless tobacco: Never Used  Substance Use Topics  . Alcohol use: No  . Drug use: No     Allergies   Apple, Ancef [cefazolin], Metformin and related, Onglyza [saxagliptin], Tape, Percocet [oxycodone-acetaminophen], Sulfa antibiotics, and Vicodin [hydrocodone-acetaminophen]   Review of Systems Review of Systems  Constitutional: Negative for fever.  Respiratory: Negative for cough and shortness of breath.   Cardiovascular: Positive for chest pain and palpitations. Negative for leg swelling.  Gastrointestinal: Positive for nausea. Negative for abdominal pain.  Genitourinary: Negative for dysuria.  Neurological: Positive for dizziness. Negative for weakness.  All other systems reviewed and are negative.    Physical Exam Updated Vital Signs BP 137/74 (BP Location: Left Arm)   Pulse (!) 114   Temp 97.9 F (36.6 C)   Resp 15   Ht 1.6 m (5\' 3" )   Wt 79.4 kg   LMP 10/01/2012   SpO2 100%   BMI 31.00 kg/m   Physical Exam Vitals signs and nursing note reviewed.  Constitutional:      Appearance: She is well-developed. She is obese. She is not ill-appearing.  HENT:     Head: Normocephalic and atraumatic.  Eyes:     Pupils: Pupils are equal, round, and reactive to light.  Neck:     Musculoskeletal: Neck supple.  Cardiovascular:     Rate and Rhythm: Normal rate and regular rhythm.     Heart sounds: Normal heart sounds.  Pulmonary:     Effort: Pulmonary effort is normal. No respiratory distress.     Breath sounds: No wheezing.  Abdominal:     Palpations: Abdomen is soft.     Tenderness: There is no abdominal tenderness.  Musculoskeletal:     Right lower leg: She exhibits no tenderness. No edema.     Left  lower leg: She exhibits no tenderness. No edema.  Skin:    General: Skin is warm and dry.  Neurological:     Mental Status: She is alert and oriented to person, place, and time.     Comments: Fluent speech, cranial nerves II through XII intact, no dysmetria to finger-nose-finger  Psychiatric:        Mood and Affect: Mood normal.      ED Treatments / Results  Labs (all labs ordered are listed, but only abnormal results are displayed) Labs Reviewed  BASIC METABOLIC PANEL - Abnormal;  Notable for the following components:      Result Value   Glucose, Bld 161 (*)    All other components within normal limits  CBC WITH DIFFERENTIAL/PLATELET  TSH  TROPONIN I (HIGH SENSITIVITY)  TROPONIN I (HIGH SENSITIVITY)    EKG EKG Interpretation  Date/Time:  Sunday November 04 2018 22:39:08 EDT Ventricular Rate:  76 PR Interval:  144 QRS Duration: 82 QT Interval:  402 QTC Calculation: 452 R Axis:   72 Text Interpretation:  Normal sinus rhythm with sinus arrhythmia Normal ECG Confirmed by Thayer Jew [phone]) on 11/04/2018 11:56:08 PM   Radiology Dg Chest 2 View  Result Date: 11/05/2018 CLINICAL DATA:  53 year old female with chest pain dizziness, lightheadedness. EXAM: CHEST - 2 VIEW COMPARISON:  Chest radiographs 07/09/2015. FINDINGS: Lung volumes and mediastinal contours remain normal. Visualized tracheal air column is within normal limits. Both lungs appear stable and clear. Mild scoliosis. No acute osseous abnormality identified. Negative visible bowel gas pattern. IMPRESSION: Stable and negative.  No cardiopulmonary abnormality. Electronically Signed   By: Genevie Ann M.D.   On: 11/05/2018 00:54    Procedures Procedures (including critical care time)  Medications Ordered in ED Medications  diazepam (VALIUM) tablet 5 mg (5 mg Oral Given 11/05/18 0041)  sodium chloride 0.9 % bolus 500 mL (0 mLs Intravenous Stopped 11/05/18 0216)  alum & mag hydroxide-simeth (MAALOX/MYLANTA) 200-200-20  MG/5ML suspension 30 mL (30 mLs Oral Given 11/05/18 0215)     Initial Impression / Assessment and Plan / ED Course  I have reviewed the triage vital signs and the nursing notes.  Pertinent labs & imaging results that were available during my care of the patient were reviewed by me and considered in my medical decision making (see chart for details).  Clinical Course as of Nov 04 349  Mon Nov 05, 2018  0322 Patient reporting palpitations.  Noted to have heart rate of 114 on the monitor.  Appears to be in sinus tachycardia on repeat EKG.  Repeat troponin is negative.   [CH]    Clinical Course User Index [CH] Horton, Barbette Hair, MD       Patient presents with chest pain and palpitations.  She is overall nontoxic-appearing and vital signs are reassuring.  Initial EKG sinus rhythm without any evidence of arrhythmia or ischemia.  Basic lab work obtained.  No significant metabolic derangements.  Have low suspicion at this time for ischemia and troponin x2 is negative.  She does have some risk factors.  Patient had one episode of palpitations while in the emergency room.  She had evidence of sinus tachycardia on repeat EKG.  No arrhythmia.  I had a long discussion with the patient and her husband regarding her work-up.  I feel she may benefit from a Holter monitor.  She does have some increased stressors and endorses some anxiety; however, I stressed that all organic causes needed to be ruled out.  Patient's O2 sats are 100% and aside from 1 limited episode of tachycardia, she has remained in normal sinus rhythm.  I have low suspicion for PE.  We will have her follow-up with cardiology.  After history, exam, and medical workup I feel the patient has been appropriately medically screened and is safe for discharge home. Pertinent diagnoses were discussed with the patient. Patient was given return precautions.   Final Clinical Impressions(s) / ED Diagnoses   Final diagnoses:  Palpitations  Atypical  chest pain    ED Discharge Orders    None  Merryl Hacker, MD 11/05/18 [phone]

## 2018-11-06 ENCOUNTER — Emergency Department (HOSPITAL_COMMUNITY): Payer: 59

## 2018-11-06 ENCOUNTER — Emergency Department (HOSPITAL_COMMUNITY)
Admission: EM | Admit: 2018-11-06 | Discharge: 2018-11-06 | Disposition: A | Discharge status: Home / Self Care | Payer: 59 | Attending: Emergency Medicine | Admitting: Emergency Medicine

## 2018-11-06 ENCOUNTER — Encounter (HOSPITAL_COMMUNITY): Payer: Self-pay

## 2018-11-06 ENCOUNTER — Other Ambulatory Visit: Payer: Self-pay

## 2018-11-06 DIAGNOSIS — E119 Type 2 diabetes mellitus without complications: Secondary | ICD-10-CM | POA: Diagnosis not present

## 2018-11-06 DIAGNOSIS — R079 Chest pain, unspecified: Secondary | ICD-10-CM | POA: Diagnosis not present

## 2018-11-06 DIAGNOSIS — Z79899 Other long term (current) drug therapy: Secondary | ICD-10-CM | POA: Diagnosis not present

## 2018-11-06 DIAGNOSIS — R946 Abnormal results of thyroid function studies: Secondary | ICD-10-CM | POA: Diagnosis not present

## 2018-11-06 DIAGNOSIS — R7989 Other specified abnormal findings of blood chemistry: Secondary | ICD-10-CM | POA: Diagnosis not present

## 2018-11-06 DIAGNOSIS — R0789 Other chest pain: Secondary | ICD-10-CM | POA: Diagnosis not present

## 2018-11-06 LAB — BASIC METABOLIC PANEL
Anion gap: 11 (ref 5–15)
BUN: 14 mg/dL (ref 6–20)
CO2: 22 mmol/L (ref 22–32)
Calcium: 9.3 mg/dL (ref 8.9–10.3)
Chloride: 103 mmol/L (ref 98–111)
Creatinine, Ser: 0.87 mg/dL (ref 0.44–1.00)
GFR calc Af Amer: >60 mL/min (ref >60)
GFR calc non Af Amer: >60 mL/min (ref >60)
Glucose, Bld: 189 mg/dL — ABNORMAL HIGH (ref 70–99)
Potassium: 4 mmol/L (ref 3.5–5.1)
Sodium: 136 mmol/L (ref 135–145)

## 2018-11-06 LAB — D-DIMER, QUANTITATIVE: D-Dimer, Quant: 0.35 ug/mL-FEU (ref 0.00–0.50)

## 2018-11-06 LAB — CBC
HCT: 46.2 % — ABNORMAL HIGH (ref 36.0–46.0)
Hemoglobin: 14.9 g/dL (ref 12.0–15.0)
MCH: 31.4 pg (ref 26.0–34.0)
MCHC: 32.3 g/dL (ref 30.0–36.0)
MCV: 97.3 fL (ref 80.0–100.0)
Platelets: 359 10*3/uL (ref 150–400)
RBC: 4.75 MIL/uL (ref 3.87–5.11)
RDW: 13.1 % (ref 11.5–15.5)
WBC: 8.6 10*3/uL (ref 4.0–10.5)
nRBC: 0 % (ref 0.0–0.2)

## 2018-11-06 LAB — I-STAT BETA HCG BLOOD, ED (MC, WL, AP ONLY): I-stat hCG, quantitative: <5 m[IU]/mL (ref <5)

## 2018-11-06 LAB — TROPONIN I (HIGH SENSITIVITY)
Troponin I (High Sensitivity): <2 ng/L (ref <18)
Troponin I (High Sensitivity): <2 ng/L (ref <18)

## 2018-11-06 MED ORDER — SODIUM CHLORIDE 0.9% FLUSH
3.0000 mL | Freq: Once | INTRAVENOUS | Status: AC
  Administered 2018-11-06: 3 mL via INTRAVENOUS

## 2018-11-06 NOTE — ED Notes (Signed)
Discharge instructions and follow up care discussed with pt. And husband at bedside. Pt. verbalized understanding.

## 2018-11-06 NOTE — Discharge Instructions (Addendum)
Your lab work today was reassuring.  Your EKG did show an elevated heart rate.  You will need to follow-up with your primary care provider and cardiologist for possible monitoring versus stress test. Your TSH was 7.2.  Please inform your primary care provider of this regarding follow-up. Return to the ED if you start to have worsening symptoms, leg swelling, shortness of breath, feeling like you are going to pass out, coughing up blood.

## 2018-11-06 NOTE — ED Provider Notes (Signed)
[facility] Provider Note   CSN: [id number] Arrival date & time: 11/06/18  1442    History   Chief Complaint Chief Complaint  Patient presents with   Chest Pain   Anxiety    HPI Sara Briggs is a 53 y.o. female with a past medical history of IBS, hard of hearing who presents to ED for continued intermittent chest pressure, palpitations.  Symptoms have been going on for about 2 days.  She reports a left-sided pressure sensation like "someone is holding their hand there."  She was seen and evaluated about 48 hours ago for similar symptoms with reassuring lab work, EKG and chest x-ray.  She states she feels anxious and that symptoms have persisted since her initial evaluation.  She reports associated dizziness but denies any loss of consciousness.  States that when she is sitting down, she does not have any symptoms but when she is doing activities such as doing the laundry, going up and down stairs the symptoms will recur.  She denies any leg swelling, history of DVT or PE, MI, alcohol, tobacco or other drug use.  She was referred to cardiologist after her visit 2 days ago and is scheduled to meet with them.  She denies any hemoptysis, abdominal pain, nausea, headache, vision changes.     HPI  Past Medical History:  Diagnosis Date   Allergic    Allergy    Cancer ([place])    skin cancer, basal cell carcinoma   Diabetes mellitus without complication (HCC)    HOH (hard of hearing)    Hyperlipidemia    IBS (irritable bowel syndrome)    IBS (irritable bowel syndrome)    Obesity    Rectocele     There are no active problems to display for this patient.   Past Surgical History:  Procedure Laterality Date   btl     CESAREAN SECTION     LAPAROSCOPY FOR ECTOPIC PREGNANCY       OB History   No obstetric history on file.      Home Medications    Prior to Admission medications   Medication Sig Start Date End Date Taking?  Authorizing Provider  acetaminophen (TYLENOL) 500 MG tablet Take 500 mg by mouth every 6 (six) hours as needed for pain.    [provider]  cefUROXime (CEFTIN) 500 MG tablet  09/29/18   [provider]  cetirizine (ZYRTEC) 10 MG tablet Take 10 mg by mouth every evening.     [provider]  Clobetasol Propionate 0.025 % CREA Apply 1 application topically as needed.    [provider]  cyclobenzaprine (FLEXERIL) 10 MG tablet Take 10 mg by mouth 3 (three) times daily as needed for muscle spasms.    [provider]  Eszopiclone 3 MG TABS Take 1 tablet by mouth as needed. 08/22/17   [provider]  fluticasone (FLONASE) 50 MCG/ACT nasal spray Place 2 sprays into the nose daily.    [provider]  hydrocortisone (PROCTOSOL HC) 2.5 % rectal cream Place 1 application rectally 2 (two) times daily.    [provider]  meclizine (ANTIVERT) 25 MG tablet Take 1 tablet by mouth as needed. 05/09/17   [provider]  naproxen (NAPROSYN) 500 MG tablet Take 500 mg by mouth 2 (two) times daily as needed (for pain).    [provider]  ondansetron (ZOFRAN ODT) 4 MG disintegrating tablet 4mg  ODT q6 hours prn nausea/vomit 10/15/12  Milton Ferguson, MD  phenazopyridine (PYRIDIUM) 200 MG tablet  09/27/18   [provider]  zolpidem (AMBIEN) 5 MG tablet Take 5 mg by mouth at bedtime as needed for sleep.    [provider]    Family History Family History  Problem Relation Age of Onset   Colon cancer Maternal Uncle    Colon cancer Maternal Grandfather    Diabetes Mother    Hypertension Mother    Hypertension Father    Sleep apnea Brother    Hyperlipidemia Brother    Heart disease Maternal Grandmother    Hypertension Paternal Grandmother    Hyperlipidemia Paternal Grandmother    CVA Paternal Grandfather    Hypertension Paternal Grandfather    Hyperlipidemia Paternal Grandfather    Sleep apnea  Brother    Hearing loss Brother    Leukemia Paternal Uncle    Prostate cancer Maternal Uncle    Lung cancer Maternal Aunt     Social History Social History   Tobacco Use   Smoking status: Never Smoker   Smokeless tobacco: Never Used  Substance Use Topics   Alcohol use: No   Drug use: No     Allergies   Apple, Ancef [cefazolin], Metformin and related, Onglyza [saxagliptin], Tape, Percocet [oxycodone-acetaminophen], Sulfa antibiotics, and Vicodin [hydrocodone-acetaminophen]   Review of Systems Review of Systems  Constitutional: Negative for appetite change, chills and fever.  HENT: Negative for ear pain, rhinorrhea, sneezing and sore throat.   Eyes: Negative for photophobia and visual disturbance.  Respiratory: Negative for cough, chest tightness, shortness of breath and wheezing.   Cardiovascular: Positive for chest pain and palpitations.  Gastrointestinal: Negative for abdominal pain, blood in stool, constipation, diarrhea, nausea and vomiting.  Genitourinary: Negative for dysuria, hematuria and urgency.  Musculoskeletal: Negative for myalgias.  Skin: Negative for rash.  Neurological: Negative for dizziness, weakness and light-headedness.     Physical Exam Updated Vital Signs BP (!) 104/59    Pulse 64    Temp 98.4 F (36.9 C) (Oral)    Resp 16    LMP 10/01/2012    SpO2 100%   Physical Exam Vitals signs and nursing note reviewed.  Constitutional:      General: She is not in acute distress.    Appearance: She is well-developed.  HENT:     Head: Normocephalic and atraumatic.     Nose: Nose normal.  Eyes:     General: No scleral icterus.       Left eye: No discharge.     Conjunctiva/sclera: Conjunctivae normal.  Neck:     Musculoskeletal: Normal range of motion and neck supple.  Cardiovascular:     Rate and Rhythm: Normal rate and regular rhythm.     Heart sounds: Normal heart sounds. No murmur. No friction rub. No gallop.   Pulmonary:     Effort:  Pulmonary effort is normal. No respiratory distress.     Breath sounds: Normal breath sounds.  Abdominal:     General: Bowel sounds are normal. There is no distension.     Palpations: Abdomen is soft.     Tenderness: There is no abdominal tenderness. There is no guarding.  Musculoskeletal: Normal range of motion.     Comments: No lower extremity edema, erythema or calf tenderness bilaterally.  Skin:    General: Skin is warm and dry.     Findings: No rash.  Neurological:     Mental Status: She is alert.     Motor: No abnormal muscle tone.  Coordination: Coordination normal.      ED Treatments / Results  Labs (all labs ordered are listed, but only abnormal results are displayed) Labs Reviewed  BASIC METABOLIC PANEL - Abnormal; Notable for the following components:      Result Value   Glucose, Bld 189 (*)    All other components within normal limits  CBC - Abnormal; Notable for the following components:   HCT 46.2 (*)    All other components within normal limits  D-DIMER, QUANTITATIVE (NOT AT [facility])  I-STAT BETA HCG BLOOD, ED ([facility], [facility], [facility] ONLY)  TROPONIN I (HIGH SENSITIVITY)  TROPONIN I (HIGH SENSITIVITY)    EKG EKG Interpretation  Date/Time:  Tuesday November 06 2018 14:49:43 EDT Ventricular Rate:  104 PR Interval:  136 QRS Duration: 78 QT Interval:  322 QTC Calculation: 423 R Axis:   31 Text Interpretation:  Sinus tachycardia Otherwise normal ECG Confirmed by Veryl Speak [phone]) on 11/06/2018 6:34:14 PM   Radiology Dg Chest 2 View  Result Date: 11/06/2018 CLINICAL DATA:  Central chest pain for 3 days EXAM: CHEST - 2 VIEW COMPARISON:  November 05, 2018 FINDINGS: The heart size and mediastinal contours are within normal limits. Both lungs are clear. The visualized skeletal structures are unremarkable. IMPRESSION: No active cardiopulmonary disease. Electronically Signed   By: Abelardo Diesel M.D.   On: 11/06/2018 15:35   Dg Chest 2 View  Result Date: 11/05/2018 CLINICAL  DATA:  53 year old female with chest pain dizziness, lightheadedness. EXAM: CHEST - 2 VIEW COMPARISON:  Chest radiographs 07/09/2015. FINDINGS: Lung volumes and mediastinal contours remain normal. Visualized tracheal air column is within normal limits. Both lungs appear stable and clear. Mild scoliosis. No acute osseous abnormality identified. Negative visible bowel gas pattern. IMPRESSION: Stable and negative.  No cardiopulmonary abnormality. Electronically Signed   By: Genevie Ann M.D.   On: 11/05/2018 00:54    Procedures Procedures (including critical care time)  Medications Ordered in ED Medications  sodium chloride flush (NS) 0.9 % injection 3 mL (3 mLs Intravenous Given 11/06/18 1915)     Initial Impression / Assessment and Plan / ED Course  I have reviewed the triage vital signs and the nursing notes.  Pertinent labs & imaging results that were available during my care of the patient were reviewed by me and considered in my medical decision making (see chart for details).        53 year old female presents to ED for ongoing intermittent palpitations, chest pain.  Seen and evaluated 2 days ago with reassuring work-up including initial and delta troponin, chest x-ray, EKG.  TSH was elevated to 7.2.  On my exam patient is overall well-appearing.  No lower extremity edema, erythema or calf tenderness noted concerning for DVT or PE.  She is not tachycardic, tachypneic or hypoxic on my exam.  Chest x-ray is unremarkable.  EKG shows sinus tachycardia with no other abnormalities.  Troponin x2 is negative.  CBC, BMP unremarkable.  Although she is low risk for PE, due to her age and tachycardia d-dimer was warranted.  This was negative.  She did have 1 incident here in the ED where her heart rate elevated to 120s, sinus tachycardia; lasted about 30 seconds.  This resolved spontaneously.  Patient states that she did speak to her PCP about an event monitor.  I do not feel that her symptoms are due to  ischemia, PE or her TSH.  She has remained with otherwise reassuring vital signs.  She is in agreement to  follow-up with PCP and cardiology and return for worsening symptoms.  Patient is hemodynamically stable, in NAD, and able to ambulate in the ED. Evaluation does not show pathology that would require ongoing emergent intervention or inpatient treatment. I explained the diagnosis to the patient. Pain has been managed and has no complaints prior to discharge. Patient is comfortable with above plan and is stable for discharge at this time. All questions were answered prior to disposition. Strict return precautions for returning to the ED were discussed. Encouraged follow up with PCP.   An After Visit Summary was printed and given to the patient.   Portions of this note were generated with [employer]. Dictation errors may occur despite best attempts at proofreading.   Final Clinical Impressions(s) / ED Diagnoses   Final diagnoses:  Chest wall pain  Elevated TSH    ED Discharge Orders    None       Delia Heady, PA-C 11/06/18 2034    Veryl Speak, MD 11/08/18 1505

## 2018-11-06 NOTE — ED Notes (Signed)
Pt states having increased HR >121 with return of CP. PA Hind Informed. Will continue to monitor pt.

## 2018-11-06 NOTE — ED Triage Notes (Signed)
Pt reports chest tightness and dizziness upon ambulation since earlier today. Pt HOH and very anxious in triage. Pt seen at [facility] 2 days ago for similar symptoms. Nad noted at this time.

## 2018-11-08 ENCOUNTER — Other Ambulatory Visit: Payer: Self-pay | Admitting: *Deleted

## 2018-11-08 ENCOUNTER — Telehealth: Payer: Self-pay | Admitting: *Deleted

## 2018-11-08 DIAGNOSIS — F439 Reaction to severe stress, unspecified: Secondary | ICD-10-CM | POA: Diagnosis not present

## 2018-11-08 DIAGNOSIS — R002 Palpitations: Secondary | ICD-10-CM

## 2018-11-08 DIAGNOSIS — H8110 Benign paroxysmal vertigo, unspecified ear: Secondary | ICD-10-CM | POA: Diagnosis not present

## 2018-11-08 DIAGNOSIS — F5101 Primary insomnia: Secondary | ICD-10-CM | POA: Diagnosis not present

## 2018-11-08 DIAGNOSIS — R0789 Other chest pain: Secondary | ICD-10-CM | POA: Diagnosis not present

## 2018-11-08 MED FILL — LORazepam 0.5 MG TABS: 0.5 | 10 days supply | Qty: 20 | Fill #0

## 2018-11-08 MED FILL — METOPROLOL SUCCINATE ER 25: 25 | 30 days supply | Qty: 15 | Fill #0

## 2018-11-08 NOTE — Telephone Encounter (Signed)
Informed patient [employer] will ship 30 day cardiac event monitor to her home.  Please note the mail box on your phone is full. [employer] will be contacting you to confirm shipping address.  Instructions and monitor functions discussed in detail.  Instruction booklet will also be included in monitor kit. [employer] contacted to send patient set up for sensitive skin , (football adapter and sensitive skin electrodes).

## 2018-11-08 NOTE — Telephone Encounter (Signed)
Attempted to contact patient no answer, no DPR, and mail box full. Patient had requested to receive 30 day cardiac event monitor well before she goes out of town on 11/18/18. Monitor department working remotely from home since March 2020.  Patient cannot pick up monitor from office. Event monitor inventory out of date. I will enroll patient for [employer] to ship a 30 day cardiac event monitor to her home.  They typically ship the monitor 2nd day air via [employer] after they confirm the shipping address with patient.  If they are not able to leave a message, it could delay shipment.  I will attempt to contact patient again.

## 2018-11-12 ENCOUNTER — Ambulatory Visit (INDEPENDENT_AMBULATORY_CARE_PROVIDER_SITE_OTHER): Payer: 59

## 2018-11-12 DIAGNOSIS — R002 Palpitations: Secondary | ICD-10-CM

## 2018-11-16 ENCOUNTER — Telehealth: Payer: Self-pay

## 2018-11-16 NOTE — Telephone Encounter (Signed)
Pt called stating she had an event while wearing the event monitor. Pt wanted to know if the result to the event comes straight to the office or would you guys see it after she mail it in. I told her that I will send a phone note to [employer] and someone would reach out to her to answer her questions about the monitor.

## 2018-11-19 MED FILL — ACCU-CHEK GUIDE TEST STRIP: 90 days supply | Qty: 100 | Fill #0

## 2018-11-19 NOTE — Telephone Encounter (Signed)
Patient is wearing a cardiac event monitor.  We would be notified by the monitor company if any events were serious or critical.  Patient also inquired how obtain additional sensitive skin supplies.  She would request additional supplied directly from [employer] at [phone].

## 2018-12-05 MED FILL — ESZOPICLONE 3 MG TABS: 3 | 90 days supply | Qty: 90 | Fill #0

## 2018-12-05 MED FILL — PROCTOZONE-HC 2.5 % CREA: 2.5 | 24 days supply | Qty: 60 | Fill #1

## 2018-12-06 DIAGNOSIS — H40013 Open angle with borderline findings, low risk, bilateral: Secondary | ICD-10-CM | POA: Diagnosis not present

## 2018-12-20 DIAGNOSIS — H903 Sensorineural hearing loss, bilateral: Secondary | ICD-10-CM | POA: Diagnosis not present

## 2018-12-20 DIAGNOSIS — R42 Dizziness and giddiness: Secondary | ICD-10-CM | POA: Diagnosis not present

## 2018-12-21 DIAGNOSIS — F439 Reaction to severe stress, unspecified: Secondary | ICD-10-CM | POA: Diagnosis not present

## 2018-12-21 DIAGNOSIS — R002 Palpitations: Secondary | ICD-10-CM | POA: Diagnosis not present

## 2018-12-21 DIAGNOSIS — R42 Dizziness and giddiness: Secondary | ICD-10-CM | POA: Diagnosis not present

## 2019-01-09 DIAGNOSIS — R079 Chest pain, unspecified: Secondary | ICD-10-CM | POA: Insufficient documentation

## 2019-01-09 DIAGNOSIS — R002 Palpitations: Secondary | ICD-10-CM | POA: Insufficient documentation

## 2019-01-09 NOTE — Progress Notes (Signed)
Cardiology Office Note   Date:  01/10/2019   ID:  Sara Briggs, DOB Jun 10, 1965, MRN [id number]  PCP:  Harlan Stains, MD  Cardiologist:   No primary care provider on file. Referring:  Harlan Stains, MD  Chief Complaint  Patient presents with  . Chest Pain      History of Present Illness: Sara Briggs is a 53 y.o. female who is referred by Harlan Stains, MD for evaluation of chest pain and palpitations.  She wore a Holter which demonstrated NSR and sinus tachycardia.  She had symptoms of "flutter" with the sinus rhythm and sinus tach.    She says that she went to the ED with chest pain.   The patient was in the ED twice in July with chest pain and palpitations.  I reviewed these records for this visit.  She had no objective evidence of ischemia.  Labs including Trop were negative.  She had no acute EKG changes.  She had palpitations.  These seemed to be intermittent.  She was treated with a beta blocker that was stopped before the monitor.  She felt fatigued on this.  She has been walking and has been having problems with her balance and has been "feeling like she was covered with a blanket."  She does not bring on chest pain or palpitations.  She does not describe associated symptoms.  She has not had prior cardiac work up.  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         Past Medical History:  Diagnosis Date  . Allergic   . Allergy   . Cancer ([place])    skin cancer, basal cell carcinoma  . Diabetes mellitus without complication ([place])   . HOH (hard of hearing)   . Hyperlipidemia   . IBS (irritable bowel syndrome)   . Obesity   . Rectocele     Past Surgical History:  Procedure Laterality Date  . btl    . CESAREAN SECTION    . LAPAROSCOPY FOR ECTOPIC PREGNANCY       Current Outpatient Medications  Medication Sig Dispense Refill  . acetaminophen (TYLENOL) 500 MG tablet Take 500 mg by mouth every 6 (six) hours as needed for pain.    . cetirizine (ZYRTEC)  10 MG tablet Take 10 mg by mouth every evening.     . Clobetasol Propionate 0.025 % CREA Apply 1 application topically as needed.    . cyclobenzaprine (FLEXERIL) 10 MG tablet Take 10 mg by mouth 3 (three) times daily as needed for muscle spasms.    . Eszopiclone 3 MG TABS Take 1 tablet by mouth as needed.    . fluticasone (FLONASE) 50 MCG/ACT nasal spray Place 2 sprays into the nose daily.    . hydrocortisone (PROCTOSOL HC) 2.5 % rectal cream Place 1 application rectally 2 (two) times daily.    . meclizine (ANTIVERT) 25 MG tablet Take 1 tablet by mouth as needed.    . ondansetron (ZOFRAN ODT) 4 MG disintegrating tablet 4mg  ODT q6 hours prn nausea/vomit 12 tablet 0  . traMADol (ULTRAM) 50 MG tablet Take 50 mg by mouth as needed.    . zolpidem (AMBIEN) 5 MG tablet Take 5 mg by mouth at bedtime as needed for sleep.    . propranolol (INDERAL) 10 MG tablet Take 1 tablet (10 mg total) by mouth as needed. 30 tablet 1   Current Facility-Administered Medications  Medication Dose Route Frequency Provider  Last Rate Last Dose  . triamcinolone acetonide (KENALOG) 10 MG/ML injection 10 mg  10 mg Other Once Wallene Huh, DPM        Allergies:   Apple, Ancef [cefazolin], Metformin and related, Onglyza [saxagliptin], Tape, Percocet [oxycodone-acetaminophen], Sulfa antibiotics, and Vicodin [hydrocodone-acetaminophen]    Social History:  The patient  reports that she has never smoked. She has never used smokeless tobacco. She reports that she does not drink alcohol or use drugs.   Family History:  The patient's family history includes CVA in her paternal grandfather; Colon cancer in her maternal grandfather and maternal uncle; Diabetes in her mother; Hearing loss in her brother; Heart disease in her maternal grandmother; Hyperlipidemia in her brother, paternal grandfather, and paternal grandmother; Hypertension in her father, mother, paternal grandfather, and paternal grandmother; Leukemia in her paternal  uncle; Lung cancer in her maternal aunt; Prostate cancer in her maternal uncle; Sleep apnea in her brother and brother.    ROS:  Please see the history of present illness.   Otherwise, review of systems are positive for none.   All other systems are reviewed and negative.    PHYSICAL EXAM: VS:  BP 106/68   Pulse 65   Ht 5\' 3"  (1.6 m)   Wt 178 lb 3.2 oz (80.8 kg)   LMP 10/01/2012   SpO2 95%   BMI 31.57 kg/m  , BMI Body mass index is 31.57 kg/m. GENERAL:  Well appearing HEENT:  Pupils equal round and reactive, fundi not visualized, oral mucosa unremarkable NECK:  No jugular venous distention, waveform within normal limits, carotid upstroke brisk and symmetric, no bruits, no thyromegaly LYMPHATICS:  No cervical, inguinal adenopathy LUNGS:  Clear to auscultation bilaterally BACK:  No CVA tenderness CHEST:  Unremarkable HEART:  PMI not displaced or sustained,S1 and S2 within normal limits, no S3, no S4, no clicks, no rubs, no murmurs ABD:  Flat, positive bowel sounds normal in frequency in pitch, no bruits, no rebound, no guarding, no midline pulsatile mass, no hepatomegaly, no splenomegaly EXT:  2 plus pulses throughout, no edema, no cyanosis no clubbing SKIN:  No rashes no nodules NEURO:  Cranial nerves II through XII grossly intact, motor grossly intact throughout PSYCH:  Cognitively intact, oriented to person place and time    EKG:  EKG is ordered today. The ekg ordered today demonstrates Sinus rhythm, rate 65, axis within normal limits, intervals within normal limits, no acute ST-T wave changes.   Recent Labs: 11/05/2018: TSH 7.216 11/06/2018: BUN 14; Creatinine, Ser 0.87; Hemoglobin 14.9; Platelets 359; Potassium 4.0; Sodium 136    Lipid Panel No results found for: CHOL, TRIG, HDL, CHOLHDL, VLDL, LDLCALC, LDLDIRECT    Wt Readings from Last 3 Encounters:  01/10/19 178 lb 3.2 oz (80.8 kg)  11/04/18 175 lb (79.4 kg)  07/09/15 190 lb (86.2 kg)      Other studies  Reviewed: Additional studies/ records that were reviewed today include: ED records and event monitor.  Review of the above records demonstrates:  Please see elsewhere in the note.     ASSESSMENT AND PLAN:  CHEST PAIN:    This is somewhat atypical.  I had planned a POET (Plain [employer] Exercise Treadmill).  However, she would not be able to do it because she cannot get COVID testing and quarantine secondary to work.  I will order a POET (Plain [employer] Exercise Treadmill). If this is normal and symptoms continue she might need to try a CPX.    PALPITATIONS:  I am going to try pill in pocket propranolol.  Of note I did review electrolytes and TSH and they were normal.     Current medicines are reviewed at length with the patient today.  The patient does not have concerns regarding medicines.  The following changes have been made:  As above  Labs/ tests ordered today include:   Orders Placed This Encounter  Procedures  . MYOCARDIAL PERFUSION IMAGING  . EKG 12-Lead     Disposition:   FU with me after the above testing.     Signed, Minus Breeding, MD  01/10/2019 1:08 PM    Sara Briggs

## 2019-01-10 ENCOUNTER — Ambulatory Visit (INDEPENDENT_AMBULATORY_CARE_PROVIDER_SITE_OTHER): Payer: 59 | Admitting: Cardiology

## 2019-01-10 ENCOUNTER — Encounter: Payer: Self-pay | Admitting: Cardiology

## 2019-01-10 ENCOUNTER — Other Ambulatory Visit: Payer: Self-pay

## 2019-01-10 ENCOUNTER — Telehealth: Payer: Self-pay | Admitting: Cardiology

## 2019-01-10 VITALS — BP 106/68 | HR 65 | Ht 63.0 in | Wt 178.2 lb

## 2019-01-10 DIAGNOSIS — R002 Palpitations: Secondary | ICD-10-CM

## 2019-01-10 DIAGNOSIS — R0789 Other chest pain: Secondary | ICD-10-CM | POA: Diagnosis not present

## 2019-01-10 DIAGNOSIS — R079 Chest pain, unspecified: Secondary | ICD-10-CM

## 2019-01-10 MED ORDER — PROPRANOLOL HCL 10 MG PO TABS: 10.0000 mg | ORAL_TABLET | ORAL | 1 refills | Status: DC | PRN

## 2019-01-10 MED ORDER — PROPRANOLOL HCL 10 MG PO TABS: 10.0000 mg | ORAL_TABLET | Freq: Three times a day (TID) | ORAL | 1 refills | Status: DC | PRN

## 2019-01-10 MED FILL — PROPRANOLOL 10 MG TABLET: 10 | 10 days supply | Qty: 30 | Fill #0

## 2019-01-10 NOTE — Telephone Encounter (Signed)
[facility] called for clarification of an rx sent to the pharmacy by Dr. Percival Spanish   Pt c/o medication issue:  1. Name of Medication: propranolol (INDERAL) 10 MG tablet  2. How are you currently taking this medication (dosage and times per day)? 10mg   3. Are you having a reaction (difficulty breathing--STAT)? no  4. What is your medication issue? [facility] was calling to ask about frequency instructions for this medication. Or if there is a max dosage that the patient should take on a daily basis, as the instructions were not clear.  You can contact the pharmacy or just send a new rx with new frequency

## 2019-01-10 NOTE — Telephone Encounter (Signed)
The pharmacist has been made aware that the propranolol should be 10 mg three times daily as needed. She has verbalized her understanding.

## 2019-01-10 NOTE — Patient Instructions (Addendum)
Medication Instructions:  START Propanolol 10 mg daily as needed  If you need a refill on your cardiac medications before your next appointment, please call your pharmacy.   Lab work: None ordered If you have labs (blood work) drawn today and your tests are completely normal, you will receive your results only by: Marland Kitchen MyChart Message (if you have MyChart) OR . A paper copy in the mail If you have any lab test that is abnormal or we need to change your treatment, we will call you to review the results.  Testing/Procedures: Your physician has requested that you have a lexiscan myoview. For further information please visit [email or web address]. Please follow instruction sheet, as given.   Follow-Up: . Follow up in mid November with Dr. Percival Spanish  Your physician has requested that you have a lexiscan myoview. For further information please visit [email or web address]. Please follow instruction sheet, as given. This will take place at [place], [address]  Instructions: How to prepare for your Myocardial Perfusion Test:  Do not eat or drink 3 hours prior to your test, except you may have water.  Do not consume products containing caffeine (regular or decaffeinated) 12 hours prior to your test. (ex: coffee, chocolate, sodas, tea).  Do bring a list of your current medications with you.  If not listed below, you may take your medications as normal.  Do wear comfortable clothes (no dresses or overalls) and walking shoes, tennis shoes preferred (No heels or open toe shoes are allowed).  Do NOT wear cologne, perfume, aftershave, or lotions (deodorant is allowed).  The test will take approximately 3 to 4 hours to complete  If these instructions are not followed, your test will have to be rescheduled.

## 2019-01-18 DIAGNOSIS — E1169 Type 2 diabetes mellitus with other specified complication: Secondary | ICD-10-CM | POA: Diagnosis not present

## 2019-01-18 DIAGNOSIS — R945 Abnormal results of liver function studies: Secondary | ICD-10-CM | POA: Diagnosis not present

## 2019-01-18 DIAGNOSIS — E785 Hyperlipidemia, unspecified: Secondary | ICD-10-CM | POA: Diagnosis not present

## 2019-01-23 ENCOUNTER — Encounter (HOSPITAL_COMMUNITY): Payer: 59

## 2019-01-23 DIAGNOSIS — E1169 Type 2 diabetes mellitus with other specified complication: Secondary | ICD-10-CM | POA: Diagnosis not present

## 2019-01-23 DIAGNOSIS — F5101 Primary insomnia: Secondary | ICD-10-CM | POA: Diagnosis not present

## 2019-01-23 DIAGNOSIS — R42 Dizziness and giddiness: Secondary | ICD-10-CM | POA: Diagnosis not present

## 2019-01-23 DIAGNOSIS — F419 Anxiety disorder, unspecified: Secondary | ICD-10-CM | POA: Diagnosis not present

## 2019-01-23 DIAGNOSIS — Z0000 Encounter for general adult medical examination without abnormal findings: Secondary | ICD-10-CM | POA: Diagnosis not present

## 2019-01-23 DIAGNOSIS — E785 Hyperlipidemia, unspecified: Secondary | ICD-10-CM | POA: Diagnosis not present

## 2019-01-23 DIAGNOSIS — J301 Allergic rhinitis due to pollen: Secondary | ICD-10-CM | POA: Diagnosis not present

## 2019-01-23 DIAGNOSIS — M545 Low back pain: Secondary | ICD-10-CM | POA: Diagnosis not present

## 2019-01-23 DIAGNOSIS — R946 Abnormal results of thyroid function studies: Secondary | ICD-10-CM | POA: Diagnosis not present

## 2019-01-23 MED FILL — ROSUVASTATIN CALCIUM 5 MG T: 5 | 84 days supply | Qty: 12 | Fill #0

## 2019-01-26 MED FILL — FLUTICASONE PROP 50 MCG SPR: 50 | 30 days supply | Qty: 16 | Fill #2

## 2019-02-05 ENCOUNTER — Telehealth (HOSPITAL_COMMUNITY): Payer: Self-pay

## 2019-02-05 NOTE — Telephone Encounter (Signed)
Encounter complete. 

## 2019-02-06 ENCOUNTER — Telehealth (HOSPITAL_COMMUNITY): Payer: Self-pay

## 2019-02-06 NOTE — Telephone Encounter (Signed)
Encounter complete. 

## 2019-02-07 ENCOUNTER — Other Ambulatory Visit: Payer: Self-pay

## 2019-02-07 ENCOUNTER — Ambulatory Visit (HOSPITAL_COMMUNITY)
Admission: RE | Admit: 2019-02-07 | Discharge: 2019-02-07 | Disposition: A | Discharge status: Home / Self Care | Payer: 59 | Source: Ambulatory Visit | Attending: Cardiology | Admitting: Cardiology

## 2019-02-07 DIAGNOSIS — R079 Chest pain, unspecified: Secondary | ICD-10-CM | POA: Insufficient documentation

## 2019-02-07 LAB — MYOCARDIAL PERFUSION IMAGING
LV dias vol: 82 mL (ref 46–106)
LV sys vol: 32 mL
Peak HR: 115 {beats}/min
Rest HR: 60 {beats}/min
SDS: 0
SRS: 5
SSS: 5
TID: 1.16

## 2019-02-07 MED ORDER — REGADENOSON 0.4 MG/5ML IV SOLN
0.4000 mg | Freq: Once | INTRAVENOUS | Status: AC
  Administered 2019-02-07: 0.4 mg via INTRAVENOUS

## 2019-02-07 MED ORDER — TECHNETIUM TC 99M TETROFOSMIN IV KIT
31.0000 | PACK | Freq: Once | INTRAVENOUS | Status: AC | PRN
  Administered 2019-02-07: 31 via INTRAVENOUS
  Filled 2019-02-07: qty 31

## 2019-02-07 MED ORDER — AMINOPHYLLINE 25 MG/ML IV SOLN
75.0000 mg | Freq: Once | INTRAVENOUS | Status: AC
  Administered 2019-02-07: 75 mg via INTRAVENOUS

## 2019-02-07 MED ORDER — TECHNETIUM TC 99M TETROFOSMIN IV KIT
10.7000 | PACK | Freq: Once | INTRAVENOUS | Status: AC | PRN
  Administered 2019-02-07: 10.7 via INTRAVENOUS
  Filled 2019-02-07: qty 11

## 2019-02-21 ENCOUNTER — Other Ambulatory Visit: Payer: Self-pay | Admitting: Family Medicine

## 2019-02-21 DIAGNOSIS — Z1231 Encounter for screening mammogram for malignant neoplasm of breast: Secondary | ICD-10-CM

## 2019-03-06 ENCOUNTER — Telehealth: Payer: Self-pay | Admitting: Cardiology

## 2019-03-06 MED FILL — ESZOPICLONE 3 MG TABS: 3 | 90 days supply | Qty: 90 | Fill #0

## 2019-03-06 NOTE — Telephone Encounter (Signed)
Will route to MD to see if possible for appointment to be virtual.

## 2019-03-06 NOTE — Telephone Encounter (Signed)
Moved patient to virtual day, patient aware of new date and time

## 2019-03-06 NOTE — Telephone Encounter (Signed)
Sure.  Can we move it to one of my virtual days instead?

## 2019-03-06 NOTE — Telephone Encounter (Signed)
Patient would like her appt with Dr. Percival Spanish on 03/11/19 at 9:00am to be virtual, please advise if this is possible or not.

## 2019-03-07 MED FILL — FLUTICASONE PROP 50 MCG SPR: 50 | 90 days supply | Qty: 48 | Fill #1

## 2019-03-11 ENCOUNTER — Ambulatory Visit: Payer: 59 | Admitting: Cardiology

## 2019-03-11 ENCOUNTER — Emergency Department (HOSPITAL_COMMUNITY): Payer: 59

## 2019-03-11 ENCOUNTER — Emergency Department (HOSPITAL_COMMUNITY)
Admission: EM | Admit: 2019-03-11 | Discharge: 2019-03-11 | Disposition: A | Discharge status: Home / Self Care | Payer: 59 | Attending: Emergency Medicine | Admitting: Emergency Medicine

## 2019-03-11 ENCOUNTER — Encounter (HOSPITAL_COMMUNITY): Payer: Self-pay

## 2019-03-11 ENCOUNTER — Other Ambulatory Visit: Payer: Self-pay

## 2019-03-11 DIAGNOSIS — Z79899 Other long term (current) drug therapy: Secondary | ICD-10-CM | POA: Insufficient documentation

## 2019-03-11 DIAGNOSIS — E119 Type 2 diabetes mellitus without complications: Secondary | ICD-10-CM | POA: Insufficient documentation

## 2019-03-11 DIAGNOSIS — U071 COVID-19: Secondary | ICD-10-CM | POA: Insufficient documentation

## 2019-03-11 DIAGNOSIS — R0602 Shortness of breath: Secondary | ICD-10-CM

## 2019-03-11 LAB — COMPREHENSIVE METABOLIC PANEL
ALT: 23 U/L (ref 0–44)
AST: 25 U/L (ref 15–41)
Albumin: 4.1 g/dL (ref 3.5–5.0)
Alkaline Phosphatase: 56 U/L (ref 38–126)
Anion gap: 14 (ref 5–15)
BUN: 6 mg/dL (ref 6–20)
CO2: 19 mmol/L — ABNORMAL LOW (ref 22–32)
Calcium: 8.4 mg/dL — ABNORMAL LOW (ref 8.9–10.3)
Chloride: 93 mmol/L — ABNORMAL LOW (ref 98–111)
Creatinine, Ser: 0.82 mg/dL (ref 0.44–1.00)
GFR calc Af Amer: >60 mL/min (ref >60)
GFR calc non Af Amer: >60 mL/min (ref >60)
Glucose, Bld: 189 mg/dL — ABNORMAL HIGH (ref 70–99)
Potassium: 3.6 mmol/L (ref 3.5–5.1)
Sodium: 126 mmol/L — ABNORMAL LOW (ref 135–145)
Total Bilirubin: 0.7 mg/dL (ref 0.3–1.2)
Total Protein: 7.4 g/dL (ref 6.5–8.1)

## 2019-03-11 LAB — CBC WITH DIFFERENTIAL/PLATELET
Abs Immature Granulocytes: 0.01 10*3/uL (ref 0.00–0.07)
Basophils Absolute: 0 10*3/uL (ref 0.0–0.1)
Basophils Relative: 0 %
Eosinophils Absolute: 0 10*3/uL (ref 0.0–0.5)
Eosinophils Relative: 0 %
HCT: 42.6 % (ref 36.0–46.0)
Hemoglobin: 14.4 g/dL (ref 12.0–15.0)
Immature Granulocytes: 0 %
Lymphocytes Relative: 15 %
Lymphs Abs: 0.8 10*3/uL (ref 0.7–4.0)
MCH: 31 pg (ref 26.0–34.0)
MCHC: 33.8 g/dL (ref 30.0–36.0)
MCV: 91.8 fL (ref 80.0–100.0)
Monocytes Absolute: 0.2 10*3/uL (ref 0.1–1.0)
Monocytes Relative: 4 %
Neutro Abs: 4.5 10*3/uL (ref 1.7–7.7)
Neutrophils Relative %: 81 %
Platelets: 235 10*3/uL (ref 150–400)
RBC: 4.64 MIL/uL (ref 3.87–5.11)
RDW: 12.9 % (ref 11.5–15.5)
WBC: 5.6 10*3/uL (ref 4.0–10.5)
nRBC: 0 % (ref 0.0–0.2)

## 2019-03-11 LAB — BASIC METABOLIC PANEL
Anion gap: 11 (ref 5–15)
BUN: 5 mg/dL — ABNORMAL LOW (ref 6–20)
CO2: 21 mmol/L — ABNORMAL LOW (ref 22–32)
Calcium: 8.2 mg/dL — ABNORMAL LOW (ref 8.9–10.3)
Chloride: 99 mmol/L (ref 98–111)
Creatinine, Ser: 0.8 mg/dL (ref 0.44–1.00)
GFR calc Af Amer: >60 mL/min (ref >60)
GFR calc non Af Amer: >60 mL/min (ref >60)
Glucose, Bld: 144 mg/dL — ABNORMAL HIGH (ref 70–99)
Potassium: 3.9 mmol/L (ref 3.5–5.1)
Sodium: 131 mmol/L — ABNORMAL LOW (ref 135–145)

## 2019-03-11 LAB — TROPONIN I (HIGH SENSITIVITY)
Troponin I (High Sensitivity): <2 ng/L (ref <18)
Troponin I (High Sensitivity): <2 ng/L (ref <18)

## 2019-03-11 MED ORDER — ONDANSETRON HCL 4 MG/2ML IJ SOLN
4.0000 mg | Freq: Once | INTRAMUSCULAR | Status: AC
  Administered 2019-03-11: 4 mg via INTRAVENOUS
  Filled 2019-03-11: qty 2

## 2019-03-11 MED ORDER — ACETAMINOPHEN 500 MG PO TABS
1000.0000 mg | ORAL_TABLET | Freq: Once | ORAL | Status: AC
  Administered 2019-03-11: 1000 mg via ORAL
  Filled 2019-03-11: qty 2

## 2019-03-11 MED ORDER — SODIUM CHLORIDE 0.9 % IV BOLUS
1000.0000 mL | Freq: Once | INTRAVENOUS | Status: AC
  Administered 2019-03-11: 17:00:00 1000 mL via INTRAVENOUS

## 2019-03-11 NOTE — Discharge Instructions (Addendum)
Person Under Monitoring Name: Sara Briggs  Location: [address] [place] [address]   Infection Prevention Recommendations for Individuals Confirmed to have, or Being Evaluated for, 2019 Novel Coronavirus (COVID-19) Infection Who Receive Care at Home  Individuals who are confirmed to have, or are being evaluated for, COVID-19 should follow the prevention steps below until a healthcare provider or local or state health department says they can return to normal activities.  Stay home except to get medical care You should restrict activities outside your home, except for getting medical care. Do not go to work, school, or public areas, and do not use public transportation or taxis.  Call ahead before visiting your doctor Before your medical appointment, call the healthcare provider and tell them that you have, or are being evaluated for, COVID-19 infection. This will help the healthcare providers office take steps to keep other people from getting infected. Ask your healthcare provider to call the local or state health department.  Monitor your symptoms Seek prompt medical attention if your illness is worsening (e.g., difficulty breathing). Before going to your medical appointment, call the healthcare provider and tell them that you have, or are being evaluated for, COVID-19 infection. Ask your healthcare provider to call the local or state health department.  Wear a facemask You should wear a facemask that covers your nose and mouth when you are in the same room with other people and when you visit a healthcare provider. People who live with or visit you should also wear a facemask while they are in the same room with you.  Separate yourself from other people in your home As much as possible, you should stay in a different room from other people in your home. Also, you should use a separate bathroom, if available.  Avoid sharing household items You should not share  dishes, drinking glasses, cups, eating utensils, towels, bedding, or other items with other people in your home. After using these items, you should wash them thoroughly with soap and water.  Cover your coughs and sneezes Cover your mouth and nose with a tissue when you cough or sneeze, or you can cough or sneeze into your sleeve. Throw used tissues in a lined trash can, and immediately wash your hands with soap and water for at least 20 seconds or use an alcohol-based hand rub.  Wash your [facility] your hands often and thoroughly with soap and water for at least 20 seconds. You can use an alcohol-based hand sanitizer if soap and water are not available and if your hands are not visibly dirty. Avoid touching your eyes, nose, and mouth with unwashed hands.   Prevention Steps for Caregivers and Household Members of Individuals Confirmed to have, or Being Evaluated for, COVID-19 Infection Being Cared for in the Home  If you live with, or provide care at home for, a person confirmed to have, or being evaluated for, COVID-19 infection please follow these guidelines to prevent infection:  Follow healthcare providers instructions Make sure that you understand and can help the patient follow any healthcare provider instructions for all care.  Provide for the patients basic needs You should help the patient with basic needs in the home and provide support for getting groceries, prescriptions, and other personal needs.  Monitor the patients symptoms If they are getting sicker, call his or her medical provider and tell them that the patient has, or is being evaluated for, COVID-19 infection. This will help the healthcare providers office  take steps to keep other people from getting infected. °Ask the healthcare provider to call the local or state health department. ° °Limit the number of people who have contact with the patient °If possible, have only one caregiver for the patient. °Other  household members should stay in another home or place of residence. If this is not possible, they should stay °in another room, or be separated from the patient as much as possible. Use a separate bathroom, if available. °Restrict visitors who do not have an essential need to be in the home. ° °Keep older adults, very young children, and other sick people away from the patient °Keep older adults, very young children, and those who have compromised immune systems or chronic health conditions away from the patient. This includes people with chronic heart, lung, or kidney conditions, diabetes, and cancer. ° °Ensure good ventilation °Make sure that shared spaces in the home have good air flow, such as from an air conditioner or an opened window, °weather permitting. ° °Wash your hands often °Wash your hands often and thoroughly with soap and water for at least 20 seconds. You can use an alcohol based hand sanitizer if soap and water are not available and if your hands are not visibly dirty. °Avoid touching your eyes, nose, and mouth with unwashed hands. °Use disposable paper towels to dry your hands. If not available, use dedicated cloth towels and replace them when they become wet. ° °Wear a facemask and gloves °Wear a disposable facemask at all times in the room and gloves when you touch or have contact with the patient’s blood, body fluids, and/or secretions or excretions, such as sweat, saliva, sputum, nasal mucus, vomit, urine, or feces.  Ensure the mask fits over your nose and mouth tightly, and do not touch it during use. °Throw out disposable facemasks and gloves after using them. Do not reuse. °Wash your hands immediately after removing your facemask and gloves. °If your personal clothing becomes contaminated, carefully remove clothing and launder. Wash your hands after handling contaminated clothing. °Place all used disposable facemasks, gloves, and other waste in a lined container before disposing them with  other household waste. °Remove gloves and wash your hands immediately after handling these items. ° °Do not share dishes, glasses, or other household items with the patient °Avoid sharing household items. You should not share dishes, drinking glasses, cups, eating utensils, towels, bedding, or other items with a patient who is confirmed to have, or being evaluated for, COVID-19 infection. °After the person uses these items, you should wash them thoroughly with soap and water. ° °Wash laundry thoroughly °Immediately remove and wash clothes or bedding that have blood, body fluids, and/or secretions or excretions, such as sweat, saliva, sputum, nasal mucus, vomit, urine, or feces, on them. °Wear gloves when handling laundry from the patient. °Read and follow directions on labels of laundry or clothing items and detergent. In general, wash and dry with the warmest temperatures recommended on the label. ° °Clean all areas the individual has used often °Clean all touchable surfaces, such as counters, tabletops, doorknobs, bathroom fixtures, toilets, phones, keyboards, tablets, and bedside tables, every day. Also, clean any surfaces that may have blood, body fluids, and/or secretions or excretions on them. °Wear gloves when cleaning surfaces the patient has come in contact with. °Use a diluted bleach solution (e.g., dilute bleach with 1 part bleach and 10 parts water) or a household disinfectant with a label that says EPA-registered for coronaviruses. To make a bleach   solution at home, add 1 tablespoon of bleach to 1 quart (4 cups) of water. For a larger supply, add  cup of bleach to 1 gallon (16 cups) of water. Read labels of cleaning products and follow recommendations provided on product labels. Labels contain instructions for safe and effective use of the cleaning product including precautions you should take when applying the product, such as wearing gloves or eye protection and making sure you have good ventilation  during use of the product. Remove gloves and wash hands immediately after cleaning.  Monitor yourself for signs and symptoms of illness Caregivers and household members are considered close contacts, should monitor their health, and will be asked to limit movement outside of the home to the extent possible. Follow the monitoring steps for close contacts listed on the symptom monitoring form.   ? If you have additional questions, contact your local health department or call the epidemiologist on call at [phone] (available 24/7). ? This guidance is subject to change. For the most up-to-date guidance from [facility], please refer to their website: [email or web address]

## 2019-03-11 NOTE — ED Provider Notes (Signed)
[facility]ER[facility]ENCY DEPARTMENT Provider Note   CSN: [id number] Arrival date & time: 03/11/19  1517     History   Chief Complaint Chief Complaint  Patient presents with  . Shortness of Breath    HPI Sara Briggs is a 53 y.o. female.     HPI  Sara Briggs is a 53 year old female with PMH of DM, HLD, IBS who presents to the ED with concern for shortness of breath. Pt reports Sara Briggs first had cough and fever for 3-4 days. Sara Briggs was diagnosed with Covid-19 on Friday. Pt has experienced worsening shortness of breath today. Also endorses some chest burning sensation in the middle of her chest since yesterday. No prior cardiac history. Sara Briggs has been taking tylenol and nyquil for her symptoms. Her husband has also had similar symptoms. No prior history of blood clots. No leg swelling or pain.   Past Medical History:  Diagnosis Date  . Allergic   . Allergy   . Cancer ([place])    skin cancer, basal cell carcinoma  . Diabetes mellitus without complication ([place])   . HOH (hard of hearing)   . Hyperlipidemia   . IBS (irritable bowel syndrome)   . Obesity   . Rectocele     Patient Active Problem List   Diagnosis Date Noted  . Chest pain of uncertain etiology [id number]  . Palpitations 01/09/2019    Past Surgical History:  Procedure Laterality Date  . btl    . CESAREAN SECTION    . LAPAROSCOPY FOR ECTOPIC PREGNANCY       OB History   No obstetric history on file.      Home Medications    Prior to Admission medications   Medication Sig Start Date End Date Taking? Authorizing Provider  acetaminophen (TYLENOL) 500 MG tablet Take 500 mg by mouth every 6 (six) hours as needed for pain.    [provider]  cetirizine (ZYRTEC) 10 MG tablet Take 10 mg by mouth every evening.     [provider]  Clobetasol Propionate 0.025 % CREA Apply 1 application topically as needed.    [provider]  cyclobenzaprine (FLEXERIL) 10 MG tablet Take 10 mg  by mouth 3 (three) times daily as needed for muscle spasms.    [provider]  Eszopiclone 3 MG TABS Take 1 tablet by mouth as needed. 08/22/17   [provider]  fluticasone (FLONASE) 50 MCG/ACT nasal spray Place 2 sprays into the nose daily.    [provider]  hydrocortisone (PROCTOSOL HC) 2.5 % rectal cream Place 1 application rectally 2 (two) times daily.    [provider]  meclizine (ANTIVERT) 25 MG tablet Take 1 tablet by mouth as needed. 05/09/17   [provider]  ondansetron (ZOFRAN ODT) 4 MG disintegrating tablet 4mg  ODT q6 hours prn nausea/vomit 10/15/12   Milton Ferguson, MD  propranolol (INDERAL) 10 MG tablet Take 1 tablet (10 mg total) by mouth 3 (three) times daily as needed. 01/10/19   Minus Breeding, MD  traMADol (ULTRAM) 50 MG tablet Take 50 mg by mouth as needed. 09/18/18   [provider]  zolpidem (AMBIEN) 5 MG tablet Take 5 mg by mouth at bedtime as needed for sleep.    [provider]    Family History Family History  Problem Relation Age of Onset  . Colon cancer Maternal Uncle   . Colon cancer Maternal Grandfather   . Diabetes Mother   . Hypertension Mother   .  Hypertension Father   . Sleep apnea Brother   . Hyperlipidemia Brother   . Heart disease Maternal Grandmother   . Hypertension Paternal Grandmother   . Hyperlipidemia Paternal Grandmother   . CVA Paternal Grandfather   . Hypertension Paternal Grandfather   . Hyperlipidemia Paternal Grandfather   . Sleep apnea Brother   . Hearing loss Brother   . Leukemia Paternal Uncle   . Prostate cancer Maternal Uncle   . Lung cancer Maternal Aunt     Social History Social History   Tobacco Use  . Smoking status: Never Smoker  . Smokeless tobacco: Never Used  Substance Use Topics  . Alcohol use: No  . Drug use: No     Allergies   Apple, Ancef [cefazolin], Metformin and related, Onglyza [saxagliptin], Tape, Percocet [oxycodone-acetaminophen],  Sulfa antibiotics, and Vicodin [hydrocodone-acetaminophen]   Review of Systems Review of Systems  Constitutional: Positive for chills and fever.  HENT: Negative for ear pain and sore throat.   Eyes: Negative for pain and visual disturbance.  Respiratory: Positive for cough and shortness of breath.   Cardiovascular: Positive for chest pain. Negative for palpitations.  Gastrointestinal: Positive for diarrhea. Negative for abdominal pain, nausea and vomiting.  Genitourinary: Negative for dysuria and hematuria.  Musculoskeletal: Negative for arthralgias and back pain.  Skin: Negative for color change and rash.  Neurological: Negative for seizures and syncope.  Psychiatric/Behavioral: Negative for agitation and behavioral problems.  All other systems reviewed and are negative.    Physical Exam Updated Vital Signs BP 111/70   Pulse 66   Temp 97.9 F (36.6 C) (Oral)   Resp 14   LMP 10/01/2012   SpO2 98%   Physical Exam Vitals signs and nursing note reviewed.  Constitutional:      General: Sara Briggs is not in acute distress.    Appearance: Sara Briggs is well-developed.  HENT:     Head: Normocephalic and atraumatic.  Eyes:     Conjunctiva/sclera: Conjunctivae normal.  Neck:     Musculoskeletal: Neck supple.  Cardiovascular:     Rate and Rhythm: Normal rate and regular rhythm.     Heart sounds: No murmur.  Pulmonary:     Effort: Pulmonary effort is normal. No respiratory distress.     Breath sounds: Normal breath sounds.  Abdominal:     Palpations: Abdomen is soft.     Tenderness: There is no abdominal tenderness.  Skin:    General: Skin is warm and dry.  Neurological:     Mental Status: Sara Briggs is alert.      ED Treatments / Results  Labs (all labs ordered are listed, but only abnormal results are displayed) Labs Reviewed  COMPREHENSIVE METABOLIC PANEL - Abnormal; Notable for the following components:      Result Value   Sodium 126 (*)    Chloride 93 (*)    CO2 19 (*)     Glucose, Bld 189 (*)    Calcium 8.4 (*)    All other components within normal limits  BASIC METABOLIC PANEL - Abnormal; Notable for the following components:   Sodium 131 (*)    CO2 21 (*)    Glucose, Bld 144 (*)    BUN 5 (*)    Calcium 8.2 (*)    All other components within normal limits  CBC WITH DIFFERENTIAL/PLATELET  TROPONIN I (HIGH SENSITIVITY)  TROPONIN I (HIGH SENSITIVITY)    EKG EKG Interpretation  Date/Time:  Monday March 11 2019 16:05:05 EST Ventricular Rate:  75 PR  Interval:    QRS Duration: 105 QT Interval:  391 QTC Calculation: 437 R Axis:   53 Text Interpretation: Sinus rhythm Probable left atrial enlargement No significant change since last tracing Confirmed by Wandra Arthurs [id number]) on 03/11/2019 5:14:41 PM   Radiology Dg Chest Port 1 View  Result Date: 03/11/2019 CLINICAL DATA:  Shortness of breath. COVID (+) 3 days ago. Increasing shortness of breath EXAM: PORTABLE CHEST 1 VIEW COMPARISON:  Chest radiograph 11/06/2018 FINDINGS: Heart size within normal limits. No airspace consolidation. No evidence of pneumothorax or sizable pleural effusion. No acute bony abnormality. Overlying monitoring leads. IMPRESSION: No airspace consolidation. Electronically Signed   By: Kellie Simmering DO   On: 03/11/2019 16:54    Procedures Procedures (including critical care time)  Medications Ordered in ED Medications  acetaminophen (TYLENOL) tablet 1,000 mg (1,000 mg Oral Given 03/11/19 1652)  sodium chloride 0.9 % bolus 1,000 mL (0 mLs Intravenous Stopped 03/11/19 1842)  ondansetron (ZOFRAN) injection 4 mg (4 mg Intravenous Given 03/11/19 1653)   Sara Briggs was evaluated in Emergency Department on 03/12/2019 for the symptoms described in the history of present illness. Sara Briggs was evaluated in the context of the global COVID-19 pandemic, which necessitated consideration that the patient might be at risk for infection with the SARS-CoV-2 virus that causes COVID-19.  Institutional protocols and algorithms that pertain to the evaluation of patients at risk for COVID-19 are in a state of rapid change based on information released by regulatory bodies including the CDC and federal and state organizations. These policies and algorithms were followed during the patient's care in the ED.   Initial Impression / Assessment and Plan / ED Course  I have reviewed the triage vital signs and the nursing notes.  Pertinent labs & imaging results that were available during my care of the patient were reviewed by me and considered in my medical decision making (see chart for details).  On arrival, afebrile, HDS. Satting 98% on room air.  Considered: Viral URI, PNA, Symptoms from Covid-19, ACS; no tachycardia/hypoxia to suggest acute PE  EXG: NSR Troponin negative x2 Normal myocardial perfusion study last month Chest pain description is atypical, low suspicion for ACS  CXR: No acute findings  Upon reassessment, pt continues to be well appearing and in no acute respiratory distress. Concern for symptomatic Covid-19 without current clinical evidence of superimposed infection. Stable for discharge with PCP follow up. Patient in agreement with plan. Return precautions given.   Final Clinical Impressions(s) / ED Diagnoses   Final diagnoses:  Shortness of breath  COVID-19    ED Discharge Orders    None       Burns Spain, MD 03/12/19 1331    Drenda Freeze, MD 03/15/19 [phone]

## 2019-03-11 NOTE — ED Notes (Signed)
Patient verbalizes understanding of discharge instructions. Opportunity for questioning and answers were provided. Armband removed by staff, pt discharged from ED ambulatory.   

## 2019-03-11 NOTE — ED Triage Notes (Signed)
Pt tested positive for covid 3 days ago, presents to ED with increased SOB.

## 2019-03-13 NOTE — Progress Notes (Signed)
Virtual Visit via Telephone Note   This visit type was conducted due to national recommendations for restrictions regarding the COVID-19 Pandemic (e.g. social distancing) in an effort to limit this patient's exposure and mitigate transmission in our community.  Due to her co-morbid illnesses, this patient is at least at moderate risk for complications without adequate follow up.  This format is felt to be most appropriate for this patient at this time.  The patient did not have access to video technology/had technical difficulties with video requiring transitioning to audio format only (telephone).  All issues noted in this document were discussed and addressed.  No physical exam could be performed with this format.  Please refer to the patient's chart for her  consent to telehealth for [facility].   Date:  03/14/2019   ID:  Sara Briggs, DOB November 04, 1965, MRN [id number]  Patient Location: Home Provider Location: Home  PCP:  Harlan Stains, MD  Cardiologist:  Minus Breeding, MD  Electrophysiologist:  None   Evaluation Performed:  Follow-Up Visit  Chief Complaint:  Chest pain  History of Present Illness:    Sara Briggs is a 53 y.o. female who was referred by Harlan Stains, MD for evaluation of chest pain and palpitations.  She wore a Holter which demonstrated NSR and sinus tachycardia.    She had a negative perfusion study.   She was in the ED a couple of days ago and she was found to have COVID 19.    Since her stress test which was negative she has had no further chest discomfort.  She had some vague symptoms while trying to be active and walking outside but since the weather turned colder that is not as much of an issue.  She denies any chest pressure, neck or arm discomfort.  She is really not bothered by any palpitations, presyncope or syncope.  The patient does have symptoms concerning for COVID-19 infection (fever, chills, cough, or new shortness of breath).    Past Medical  History:  Diagnosis Date  . Allergic   . Allergy   . Cancer ([place])    skin cancer, basal cell carcinoma  . Diabetes mellitus without complication ([place])   . HOH (hard of hearing)   . Hyperlipidemia   . IBS (irritable bowel syndrome)   . Obesity   . Rectocele    Past Surgical History:  Procedure Laterality Date  . btl    . CESAREAN SECTION    . LAPAROSCOPY FOR ECTOPIC PREGNANCY       Current Meds  Medication Sig  . acetaminophen (TYLENOL) 500 MG tablet Take 500 mg by mouth every 6 (six) hours as needed for pain.  . cetirizine (ZYRTEC) 10 MG tablet Take 10 mg by mouth every evening.   . Clobetasol Propionate 0.025 % CREA Apply 1 application topically as needed.  . cyclobenzaprine (FLEXERIL) 10 MG tablet Take 10 mg by mouth 3 (three) times daily as needed for muscle spasms.  . Eszopiclone 3 MG TABS Take 1 tablet by mouth as needed.  . fluticasone (FLONASE) 50 MCG/ACT nasal spray Place 2 sprays into the nose daily.  . hydrocortisone (PROCTOSOL HC) 2.5 % rectal cream Place 1 application rectally 2 (two) times daily.  . meclizine (ANTIVERT) 25 MG tablet Take 1 tablet by mouth as needed.  . ondansetron (ZOFRAN ODT) 4 MG disintegrating tablet 4mg  ODT q6 hours prn nausea/vomit  . propranolol (INDERAL) 10 MG tablet Take 1 tablet (10 mg total) by mouth 3 (  three) times daily as needed.  . traMADol (ULTRAM) 50 MG tablet Take 50 mg by mouth as needed.  . zolpidem (AMBIEN) 5 MG tablet Take 5 mg by mouth at bedtime as needed for sleep.   Current Facility-Administered Medications for the 03/14/19 encounter (Telemedicine) with Minus Breeding, MD  Medication  . triamcinolone acetonide (KENALOG) 10 MG/ML injection 10 mg     Allergies:   Apple, Ancef [cefazolin], Metformin and related, Onglyza [saxagliptin], Tape, Percocet [oxycodone-acetaminophen], Sulfa antibiotics, and Vicodin [hydrocodone-acetaminophen]   Social History   Tobacco Use  . Smoking status: Never Smoker  . Smokeless tobacco:  Never Used  Substance Use Topics  . Alcohol use: No  . Drug use: No     Family Hx: The patient's family history includes CVA in her paternal grandfather; Colon cancer in her maternal grandfather and maternal uncle; Diabetes in her mother; Hearing loss in her brother; Heart disease in her maternal grandmother; Hyperlipidemia in her brother, paternal grandfather, and paternal grandmother; Hypertension in her father, mother, paternal grandfather, and paternal grandmother; Leukemia in her paternal uncle; Lung cancer in her maternal aunt; Prostate cancer in her maternal uncle; Sleep apnea in her brother and brother.  ROS:   Please see the history of present illness.    See above All other systems reviewed and are negative.   Prior CV studies:   The following studies were reviewed today:    Labs/Other Tests and Data Reviewed:    EKG:  No ECG reviewed.  Recent Labs: 11/05/2018: TSH 7.216 03/11/2019: ALT 23; BUN 5; Creatinine, Ser 0.80; Hemoglobin 14.4; Platelets 235; Potassium 3.9; Sodium 131   Recent Lipid Panel No results found for: CHOL, TRIG, HDL, CHOLHDL, LDLCALC, LDLDIRECT  Wt Readings from Last 3 Encounters:  03/14/19 172 lb (78 kg)  02/07/19 178 lb (80.7 kg)  01/10/19 178 lb 3.2 oz (80.8 kg)     Objective:    Vital Signs:  BP 107/74   Pulse (!) 58   Ht 5\' 3"  (1.6 m)   Wt 172 lb (78 kg)   LMP 10/01/2012   BMI 30.47 kg/m    VITAL SIGNS:  reviewed  ASSESSMENT & PLAN:     CHEST PAIN:     She is no longer having symptoms.  No further work-up is suggested.  PALPITATIONS:   She can continue propranolol prescription as a pill in pocket although she is not needing to use this currently.  She will let me know if her symptoms return.    COVID-19 Education: The signs and symptoms of COVID-19 were discussed with the patient and how to seek care for testing (follow up with PCP or arrange E-visit).  The importance of social distancing was discussed today.  Time:    Today, I have spent 8 minutes with the patient with telehealth technology discussing the above problems.     Medication Adjustments/Labs and Tests Ordered: Current medicines are reviewed at length with the patient today.  Concerns regarding medicines are outlined above.   Tests Ordered: No orders of the defined types were placed in this encounter.   Medication Changes: No orders of the defined types were placed in this encounter.   Follow Up:  as needed  Signed, Minus Breeding, MD  03/14/2019 10:24 AM    [place]

## 2019-03-14 ENCOUNTER — Telehealth (INDEPENDENT_AMBULATORY_CARE_PROVIDER_SITE_OTHER): Payer: 59 | Admitting: Cardiology

## 2019-03-14 ENCOUNTER — Encounter: Payer: Self-pay | Admitting: Cardiology

## 2019-03-14 ENCOUNTER — Telehealth: Payer: Self-pay

## 2019-03-14 VITALS — BP 107/74 | HR 58 | Ht 63.0 in | Wt 172.0 lb

## 2019-03-14 DIAGNOSIS — R079 Chest pain, unspecified: Secondary | ICD-10-CM | POA: Diagnosis not present

## 2019-03-14 DIAGNOSIS — R002 Palpitations: Secondary | ICD-10-CM | POA: Diagnosis not present

## 2019-03-14 NOTE — Telephone Encounter (Signed)
Dr Percival Spanish called for virtual appt this morning. Went to VM. Called to f/u and left another message. Will try again later.

## 2019-03-14 NOTE — Patient Instructions (Signed)
Medication Instructions:  Your physician recommends that you continue on your current medications as directed. Please refer to the Current Medication list given to you today.  If you need a refill on your cardiac medications before your next appointment, please call your pharmacy.   Lab work: NONE  Testing/Procedures: NONE  Follow-Up: At [facility], you and your health needs are our priority.  As part of our continuing mission to provide you with exceptional heart care, we have created designated Provider Care Teams.  These Care Teams include your primary Cardiologist (physician) and Advanced Practice Providers (APPs -  Physician Assistants and Nurse Practitioners) who all work together to provide you with the care you need, when you need it. You may see Minus Breeding, MD or one of the following Advanced Practice Providers on your designated Care Team:    Rosaria Ferries, PA-C  Jory Sims, DNP, ANP  Cadence Kathlen Mody, NP   Follow-up as needed.

## 2019-03-18 DIAGNOSIS — U071 COVID-19: Secondary | ICD-10-CM | POA: Diagnosis not present

## 2019-03-25 MED FILL — ACCU-CHEK GUIDE TEST STRIP: 90 days supply | Qty: 100 | Fill #1

## 2019-04-11 ENCOUNTER — Other Ambulatory Visit: Payer: Self-pay

## 2019-04-11 ENCOUNTER — Ambulatory Visit
Admission: RE | Admit: 2019-04-11 | Discharge: 2019-04-11 | Disposition: A | Discharge status: Home / Self Care | Payer: 59 | Source: Ambulatory Visit | Attending: Family Medicine | Admitting: Family Medicine

## 2019-04-11 DIAGNOSIS — Z1231 Encounter for screening mammogram for malignant neoplasm of breast: Secondary | ICD-10-CM | POA: Diagnosis not present

## 2019-04-15 MED FILL — ROSUVASTATIN CALCIUM 5 MG T: 5 | 84 days supply | Qty: 12 | Fill #1

## 2019-04-22 ENCOUNTER — Other Ambulatory Visit: Payer: Self-pay | Admitting: Family Medicine

## 2019-04-22 DIAGNOSIS — Z1231 Encounter for screening mammogram for malignant neoplasm of breast: Secondary | ICD-10-CM

## 2019-06-04 MED FILL — ESZOPICLONE 3 MG TABS: 3 | 90 days supply | Qty: 90 | Fill #0

## 2019-06-06 DIAGNOSIS — E119 Type 2 diabetes mellitus without complications: Secondary | ICD-10-CM | POA: Diagnosis not present

## 2019-06-13 IMAGING — MG DIGITAL SCREENING BILATERAL MAMMOGRAM WITH TOMO AND CAD
8 series · 9 of 24 positions shown · non-contrast
Comparison: Previous exam(s).

CLINICAL DATA: Screening.

EXAM:
DIGITAL SCREENING BILATERAL MAMMOGRAM WITH TOMO AND CAD

[L CC synth-2D]
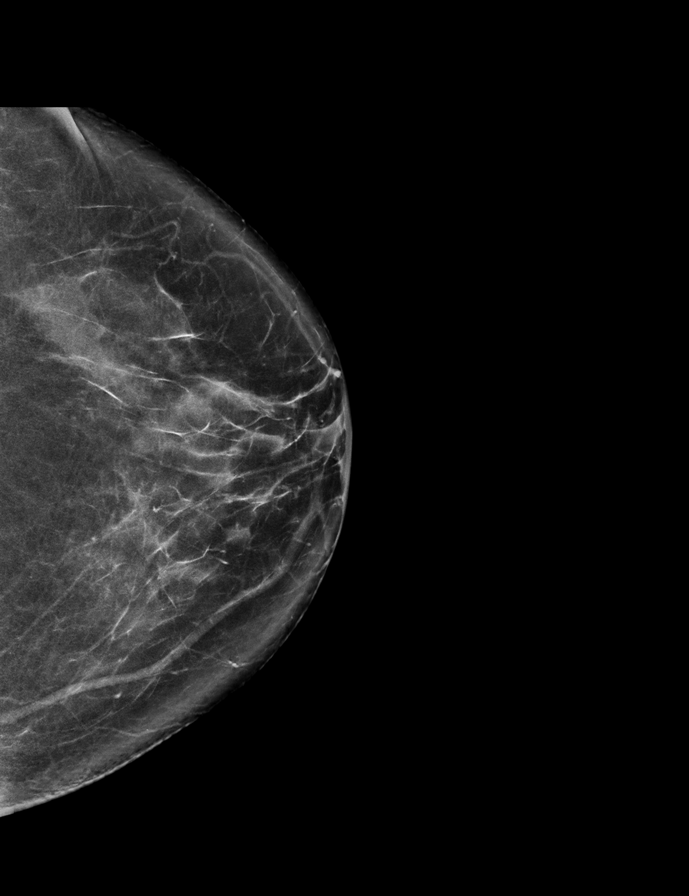

[R CC synth-2D]
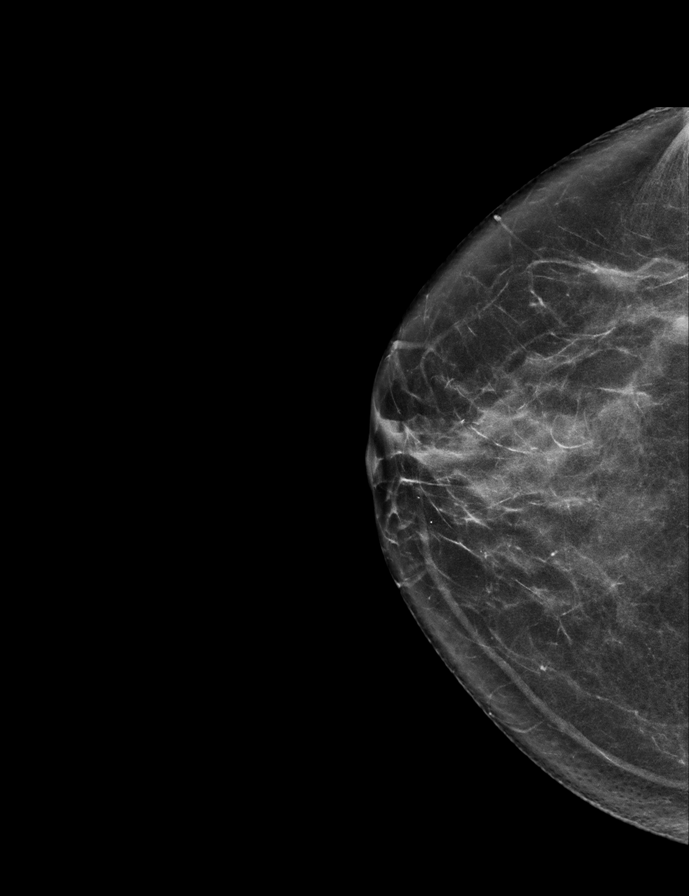

[R MLO synth-2D]
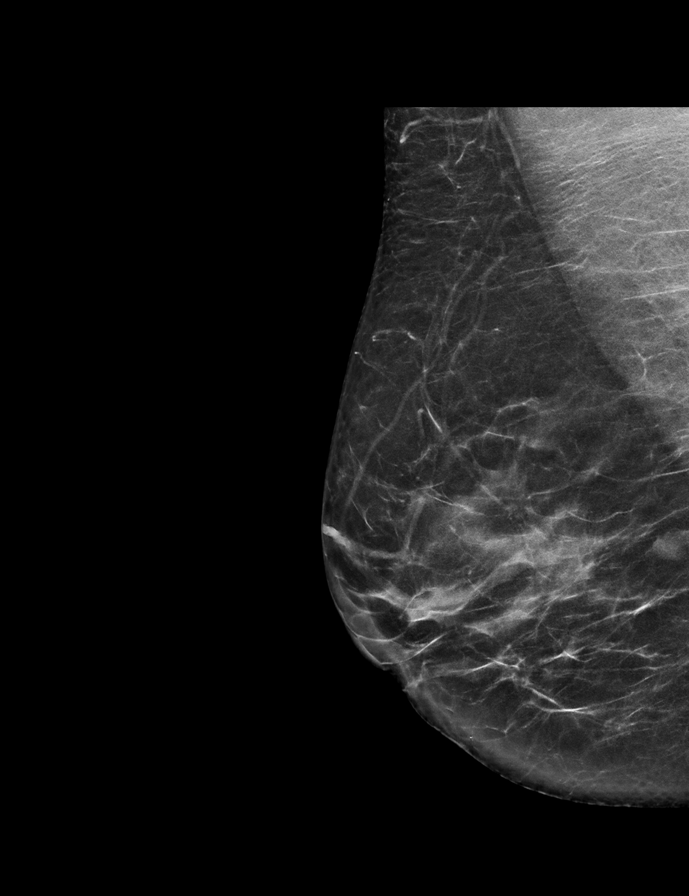

[L MLO synth-2D]
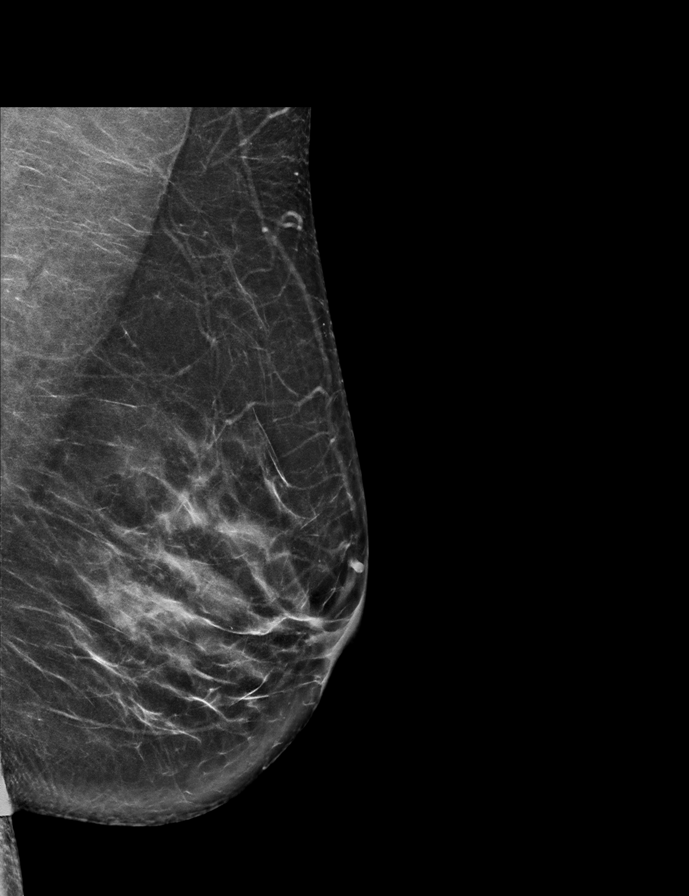

[R CC tomo · 2 of 62 frames shown]
[frame 21/62]
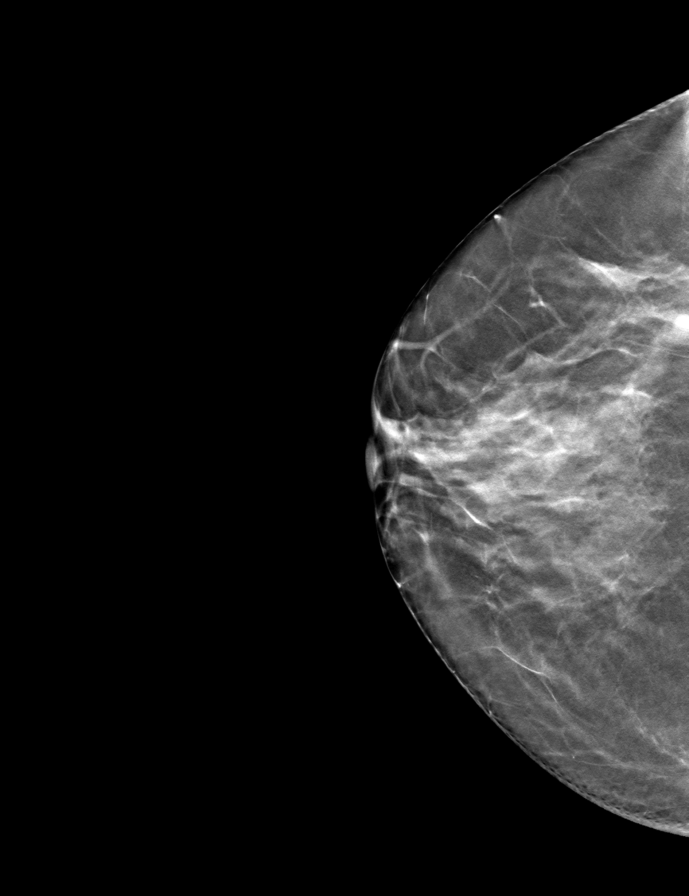
[frame 31/62]
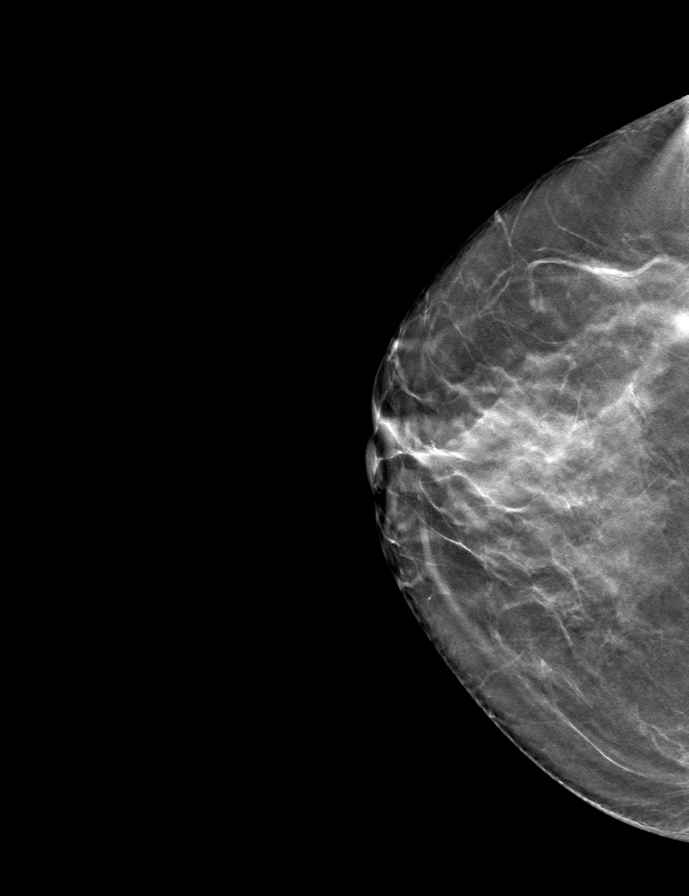

[R MLO tomo · tomo slice 34/67.0]
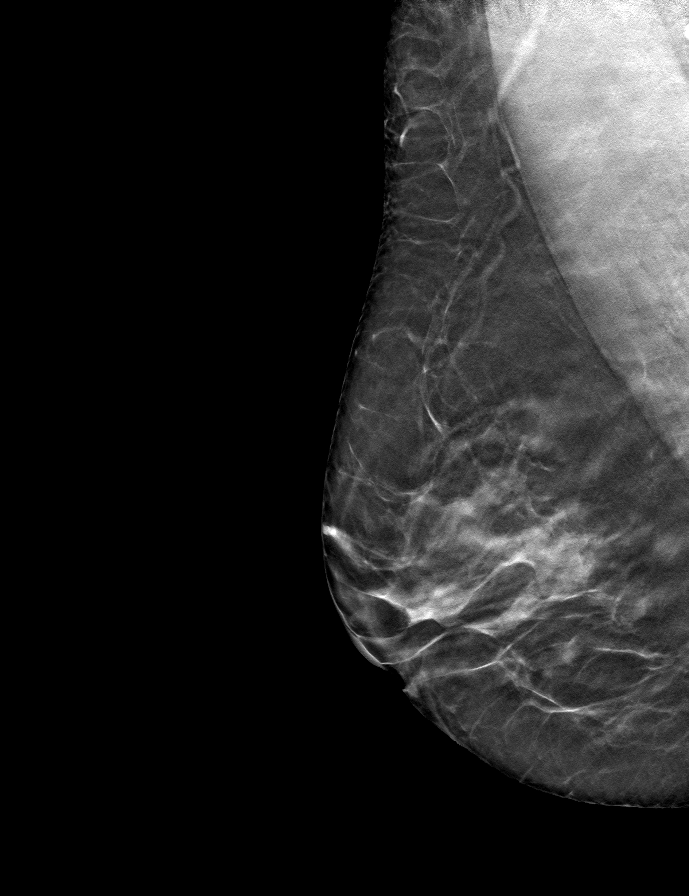

[L MLO tomo · tomo slice 33/65.0]
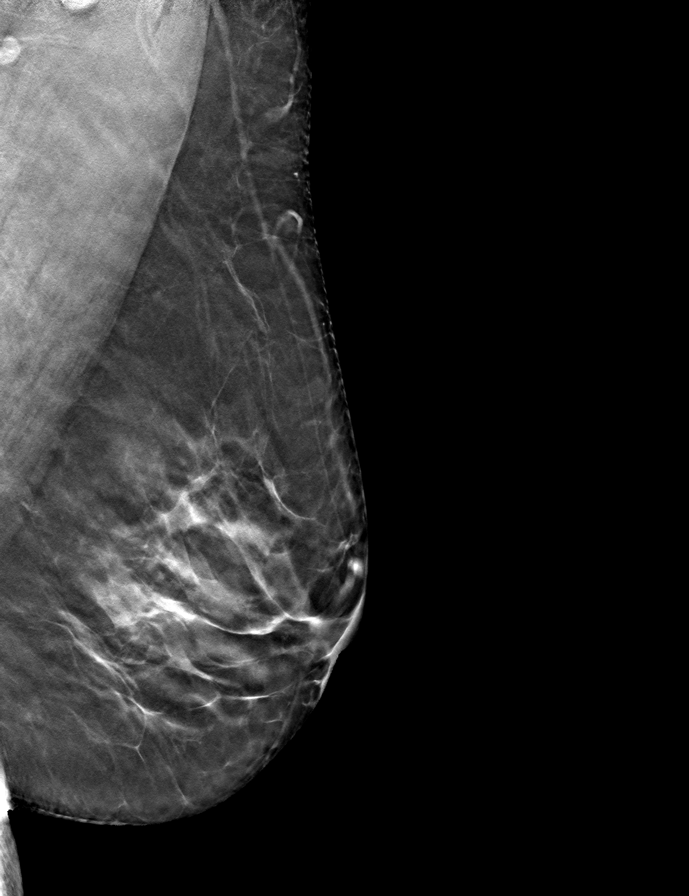

[L CC tomo · tomo slice 34/67.0]
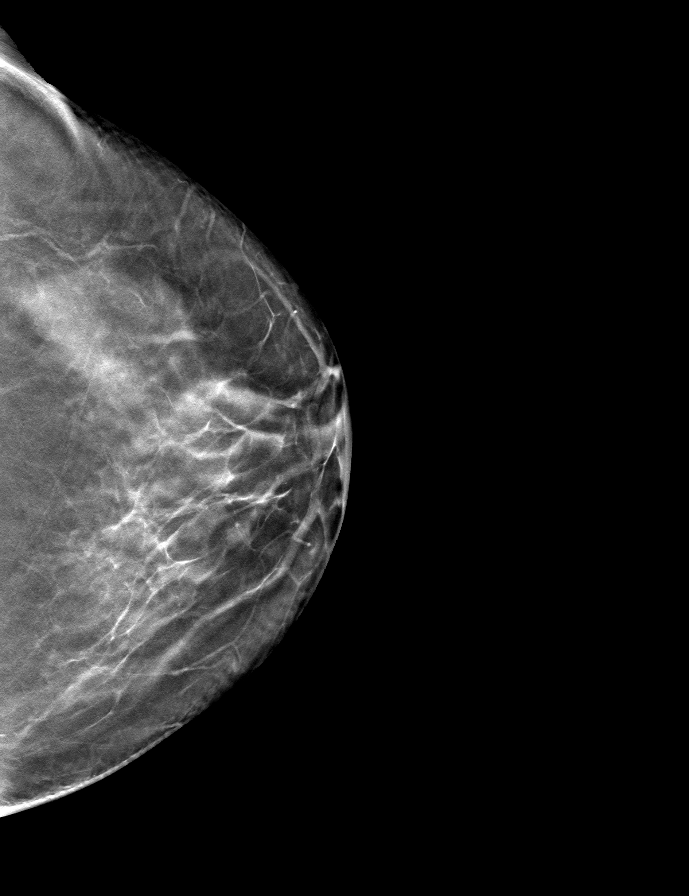

[9 of 24 positions shown; findings below may reference images not displayed]

ACR Breast Density Category c: The breast tissue is heterogeneously
dense, which may obscure small masses.
FINDINGS: There are no findings suspicious for malignancy. Images were
processed with CAD.
IMPRESSION: No mammographic evidence of malignancy. A result letter of this
screening mammogram will be mailed directly to the patient.

RECOMMENDATION:
Screening mammogram in one year. (Code:FT-U-LHB)

BI-RADS CATEGORY  1: Negative.

## 2019-07-04 MED FILL — ONDANSETRON ODT 8 MG TABLET: 8 | 10 days supply | Qty: 30 | Fill #0

## 2019-07-04 MED FILL — MECLIZINE 25 MG TABLET: 25 | 7 days supply | Qty: 30 | Fill #0

## 2019-07-09 MED FILL — ZOLPIDEM TARTRATE 10 MG TAB: 10 | 30 days supply | Qty: 30 | Fill #0

## 2019-07-09 MED FILL — ROSUVASTATIN CALCIUM 5 MG T: 5 | 84 days supply | Qty: 12 | Fill #0

## 2019-07-11 MED FILL — traMADol HCL 50 MG TABS: 50 | 2 days supply | Qty: 8 | Fill #0

## 2019-08-19 DIAGNOSIS — D2239 Melanocytic nevi of other parts of face: Secondary | ICD-10-CM | POA: Diagnosis not present

## 2019-08-19 DIAGNOSIS — D485 Neoplasm of uncertain behavior of skin: Secondary | ICD-10-CM | POA: Diagnosis not present

## 2019-08-19 DIAGNOSIS — L821 Other seborrheic keratosis: Secondary | ICD-10-CM | POA: Diagnosis not present

## 2019-08-28 DIAGNOSIS — H903 Sensorineural hearing loss, bilateral: Secondary | ICD-10-CM | POA: Diagnosis not present

## 2019-08-28 DIAGNOSIS — H9313 Tinnitus, bilateral: Secondary | ICD-10-CM | POA: Diagnosis not present

## 2019-08-29 MED FILL — ESZOPICLONE 3 MG TABS: 3 | 90 days supply | Qty: 90 | Fill #1

## 2019-09-26 MED FILL — CYCLOBENZAPRINE HCL 10 MG T: 10 | 30 days supply | Qty: 30 | Fill #0

## 2019-09-27 MED FILL — FLUTICASONE PROP 50 MCG SPR: 50 | 90 days supply | Qty: 48 | Fill #0

## 2019-10-22 MED FILL — ROSUVASTATIN CALCIUM 5 MG T: 5 | 84 days supply | Qty: 12 | Fill #1

## 2019-10-27 DIAGNOSIS — R300 Dysuria: Secondary | ICD-10-CM | POA: Diagnosis not present

## 2019-10-27 DIAGNOSIS — N390 Urinary tract infection, site not specified: Secondary | ICD-10-CM | POA: Diagnosis not present

## 2019-11-21 ENCOUNTER — Other Ambulatory Visit (HOSPITAL_COMMUNITY): Payer: Self-pay | Admitting: Otolaryngology

## 2019-11-21 ENCOUNTER — Other Ambulatory Visit: Payer: Self-pay | Admitting: Otolaryngology

## 2019-11-21 DIAGNOSIS — R42 Dizziness and giddiness: Secondary | ICD-10-CM | POA: Diagnosis not present

## 2019-11-21 DIAGNOSIS — H903 Sensorineural hearing loss, bilateral: Secondary | ICD-10-CM | POA: Diagnosis not present

## 2019-11-21 DIAGNOSIS — H8112 Benign paroxysmal vertigo, left ear: Secondary | ICD-10-CM | POA: Diagnosis not present

## 2019-11-21 DIAGNOSIS — H9313 Tinnitus, bilateral: Secondary | ICD-10-CM | POA: Diagnosis not present

## 2019-11-21 DIAGNOSIS — H905 Unspecified sensorineural hearing loss: Secondary | ICD-10-CM

## 2019-11-21 DIAGNOSIS — G43009 Migraine without aura, not intractable, without status migrainosus: Secondary | ICD-10-CM | POA: Diagnosis not present

## 2019-11-21 MED FILL — PROMETHAZINE 12.5 MG SUPPOS: 12.5 | 2 days supply | Qty: 12 | Fill #0

## 2019-11-21 MED FILL — diazePAM 5 MG TABS: 5 | 3 days supply | Qty: 10 | Fill #0

## 2019-11-21 MED FILL — TRIAMTERENE/HCTZ 37.5/25 CP: 37.5-25 | 90 days supply | Qty: 90 | Fill #0

## 2019-11-21 MED FILL — ONDANSETRON ODT 4 MG TABLET: 4 | 7 days supply | Qty: 30 | Fill #0

## 2019-11-27 MED FILL — ESZOPICLONE 3 MG TABS: 3 | 90 days supply | Qty: 90 | Fill #0

## 2019-11-28 ENCOUNTER — Ambulatory Visit (HOSPITAL_COMMUNITY)
Admission: RE | Admit: 2019-11-28 | Discharge: 2019-11-28 | Disposition: A | Discharge status: Home / Self Care | Payer: 59 | Source: Ambulatory Visit | Attending: Otolaryngology | Admitting: Otolaryngology

## 2019-11-28 DIAGNOSIS — H905 Unspecified sensorineural hearing loss: Secondary | ICD-10-CM | POA: Insufficient documentation

## 2019-11-28 DIAGNOSIS — H9313 Tinnitus, bilateral: Secondary | ICD-10-CM | POA: Diagnosis not present

## 2019-11-28 DIAGNOSIS — H9193 Unspecified hearing loss, bilateral: Secondary | ICD-10-CM | POA: Diagnosis not present

## 2019-11-28 MED ORDER — GADOBUTROL 1 MMOL/ML IV SOLN
7.5000 mL | Freq: Once | INTRAVENOUS | Status: AC | PRN
  Administered 2019-11-28: 7.5 mL via INTRAVENOUS

## 2019-12-03 DIAGNOSIS — H40013 Open angle with borderline findings, low risk, bilateral: Secondary | ICD-10-CM | POA: Diagnosis not present

## 2019-12-04 DIAGNOSIS — R42 Dizziness and giddiness: Secondary | ICD-10-CM | POA: Diagnosis not present

## 2019-12-04 DIAGNOSIS — H903 Sensorineural hearing loss, bilateral: Secondary | ICD-10-CM | POA: Diagnosis not present

## 2019-12-04 DIAGNOSIS — H9313 Tinnitus, bilateral: Secondary | ICD-10-CM | POA: Diagnosis not present

## 2019-12-04 DIAGNOSIS — H905 Unspecified sensorineural hearing loss: Secondary | ICD-10-CM | POA: Diagnosis not present

## 2019-12-21 MED FILL — FLUTICASONE PROP 50 MCG SPR: 50 | 90 days supply | Qty: 48 | Fill #1

## 2019-12-23 MED FILL — FREESTYLE LITE TEST STRIP: 90 days supply | Qty: 100 | Fill #0

## 2019-12-24 MED FILL — FREESTYLE LITE METER: 1 days supply | Qty: 1 | Fill #0

## 2019-12-24 MED FILL — ACCU-CHEK GUIDE TEST STRIP: 90 days supply | Qty: 100 | Fill #0

## 2020-01-02 DIAGNOSIS — G43009 Migraine without aura, not intractable, without status migrainosus: Secondary | ICD-10-CM | POA: Diagnosis not present

## 2020-01-02 DIAGNOSIS — H903 Sensorineural hearing loss, bilateral: Secondary | ICD-10-CM | POA: Diagnosis not present

## 2020-01-02 DIAGNOSIS — H905 Unspecified sensorineural hearing loss: Secondary | ICD-10-CM | POA: Diagnosis not present

## 2020-01-02 DIAGNOSIS — H9313 Tinnitus, bilateral: Secondary | ICD-10-CM | POA: Diagnosis not present

## 2020-01-02 DIAGNOSIS — H8101 Meniere's disease, right ear: Secondary | ICD-10-CM | POA: Diagnosis not present

## 2020-01-02 MED FILL — diazePAM 5 MG TABS: 5 | 6 days supply | Qty: 20 | Fill #0

## 2020-01-02 MED FILL — traMADol HCL 50 MG TABS: 50 | 5 days supply | Qty: 20 | Fill #0

## 2020-01-23 ENCOUNTER — Other Ambulatory Visit (HOSPITAL_COMMUNITY): Payer: Self-pay | Admitting: Physician Assistant

## 2020-01-23 DIAGNOSIS — F419 Anxiety disorder, unspecified: Secondary | ICD-10-CM | POA: Diagnosis not present

## 2020-01-23 DIAGNOSIS — E785 Hyperlipidemia, unspecified: Secondary | ICD-10-CM | POA: Diagnosis not present

## 2020-01-23 DIAGNOSIS — J301 Allergic rhinitis due to pollen: Secondary | ICD-10-CM | POA: Diagnosis not present

## 2020-01-23 DIAGNOSIS — R42 Dizziness and giddiness: Secondary | ICD-10-CM | POA: Diagnosis not present

## 2020-01-23 DIAGNOSIS — M545 Low back pain: Secondary | ICD-10-CM | POA: Diagnosis not present

## 2020-01-23 DIAGNOSIS — F5101 Primary insomnia: Secondary | ICD-10-CM | POA: Diagnosis not present

## 2020-01-23 DIAGNOSIS — Z0000 Encounter for general adult medical examination without abnormal findings: Secondary | ICD-10-CM | POA: Diagnosis not present

## 2020-01-23 DIAGNOSIS — E1169 Type 2 diabetes mellitus with other specified complication: Secondary | ICD-10-CM | POA: Diagnosis not present

## 2020-01-23 DIAGNOSIS — R002 Palpitations: Secondary | ICD-10-CM | POA: Diagnosis not present

## 2020-01-24 ENCOUNTER — Other Ambulatory Visit (HOSPITAL_COMMUNITY): Payer: Self-pay | Admitting: Family Medicine

## 2020-01-24 MED FILL — NITROFURANTOIN MONO-MCR 100: 100 | 5 days supply | Qty: 10 | Fill #0

## 2020-01-24 MED FILL — ROSUVASTATIN CALCIUM 5 MG T: 5 | 84 days supply | Qty: 12 | Fill #0

## 2020-01-27 DIAGNOSIS — E1169 Type 2 diabetes mellitus with other specified complication: Secondary | ICD-10-CM | POA: Diagnosis not present

## 2020-01-27 DIAGNOSIS — E785 Hyperlipidemia, unspecified: Secondary | ICD-10-CM | POA: Diagnosis not present

## 2020-01-29 ENCOUNTER — Other Ambulatory Visit (HOSPITAL_COMMUNITY): Payer: Self-pay | Admitting: Physician Assistant

## 2020-01-29 MED FILL — JANUVIA 100 MG TABLET: 100 | 30 days supply | Qty: 30 | Fill #0

## 2020-01-29 MED FILL — CIPROFLOXACIN HCL 500 MG TA: 500 | 5 days supply | Qty: 10 | Fill #0

## 2020-02-21 MED FILL — TRIAMTERENE/HCTZ 37.5/25 CP: 37.5-25 | 90 days supply | Qty: 90 | Fill #1

## 2020-02-22 MED FILL — JANUVIA 100 MG TABLET: 100 | 30 days supply | Qty: 30 | Fill #1

## 2020-03-23 MED FILL — glipiZIDE 5 MG TABS: 5 | 90 days supply | Qty: 180 | Fill #0

## 2020-04-13 ENCOUNTER — Ambulatory Visit: Payer: 59

## 2020-10-19 IMAGING — CR CHEST - 2 VIEW
2 series · 2 of 2 positions shown · non-contrast
Comparison: Chest radiographs 07/09/2015.

CLINICAL DATA: 53-year-old female with chest pain dizziness,
lightheadedness.

EXAM:
CHEST - 2 VIEW

[w chest pa]
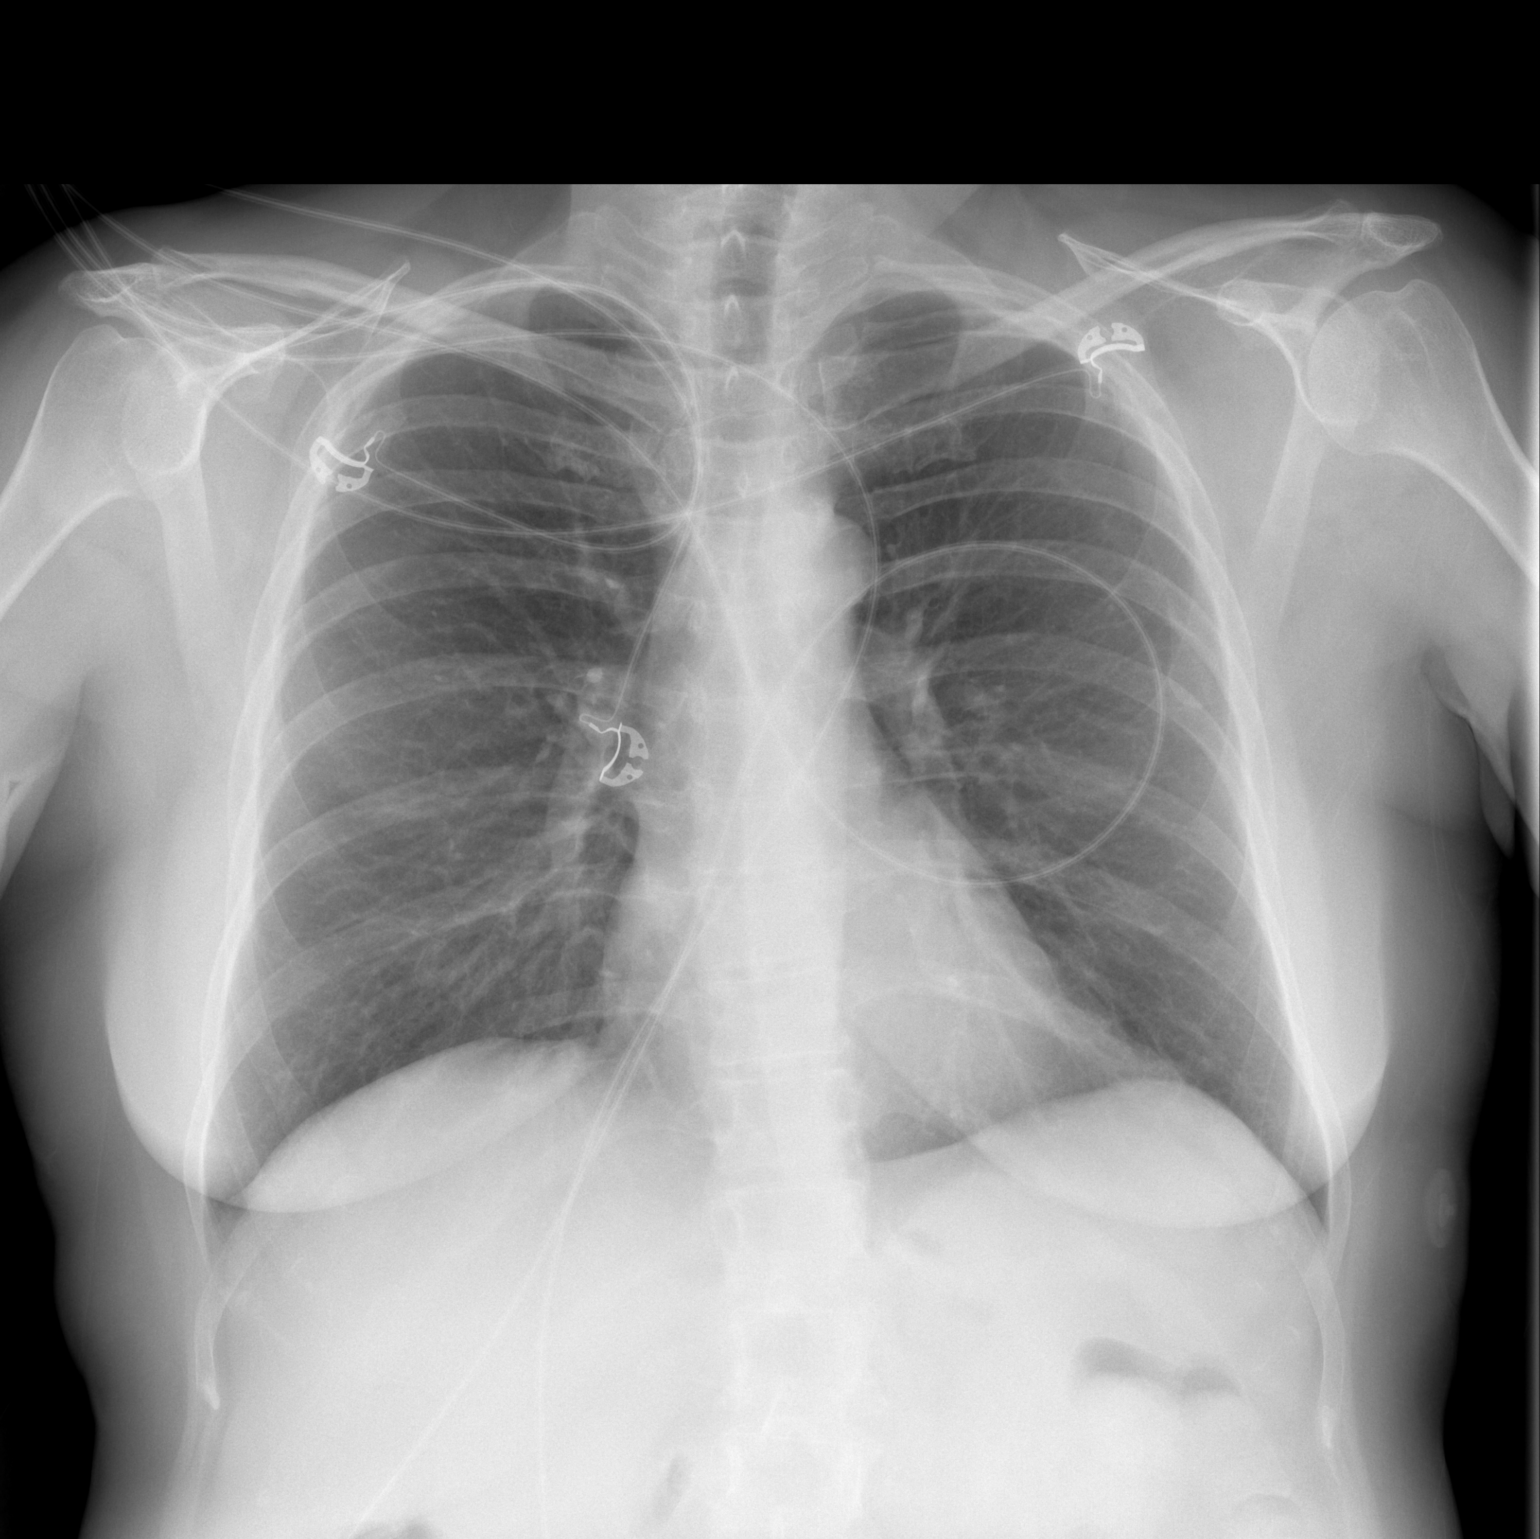

[w chest lat]
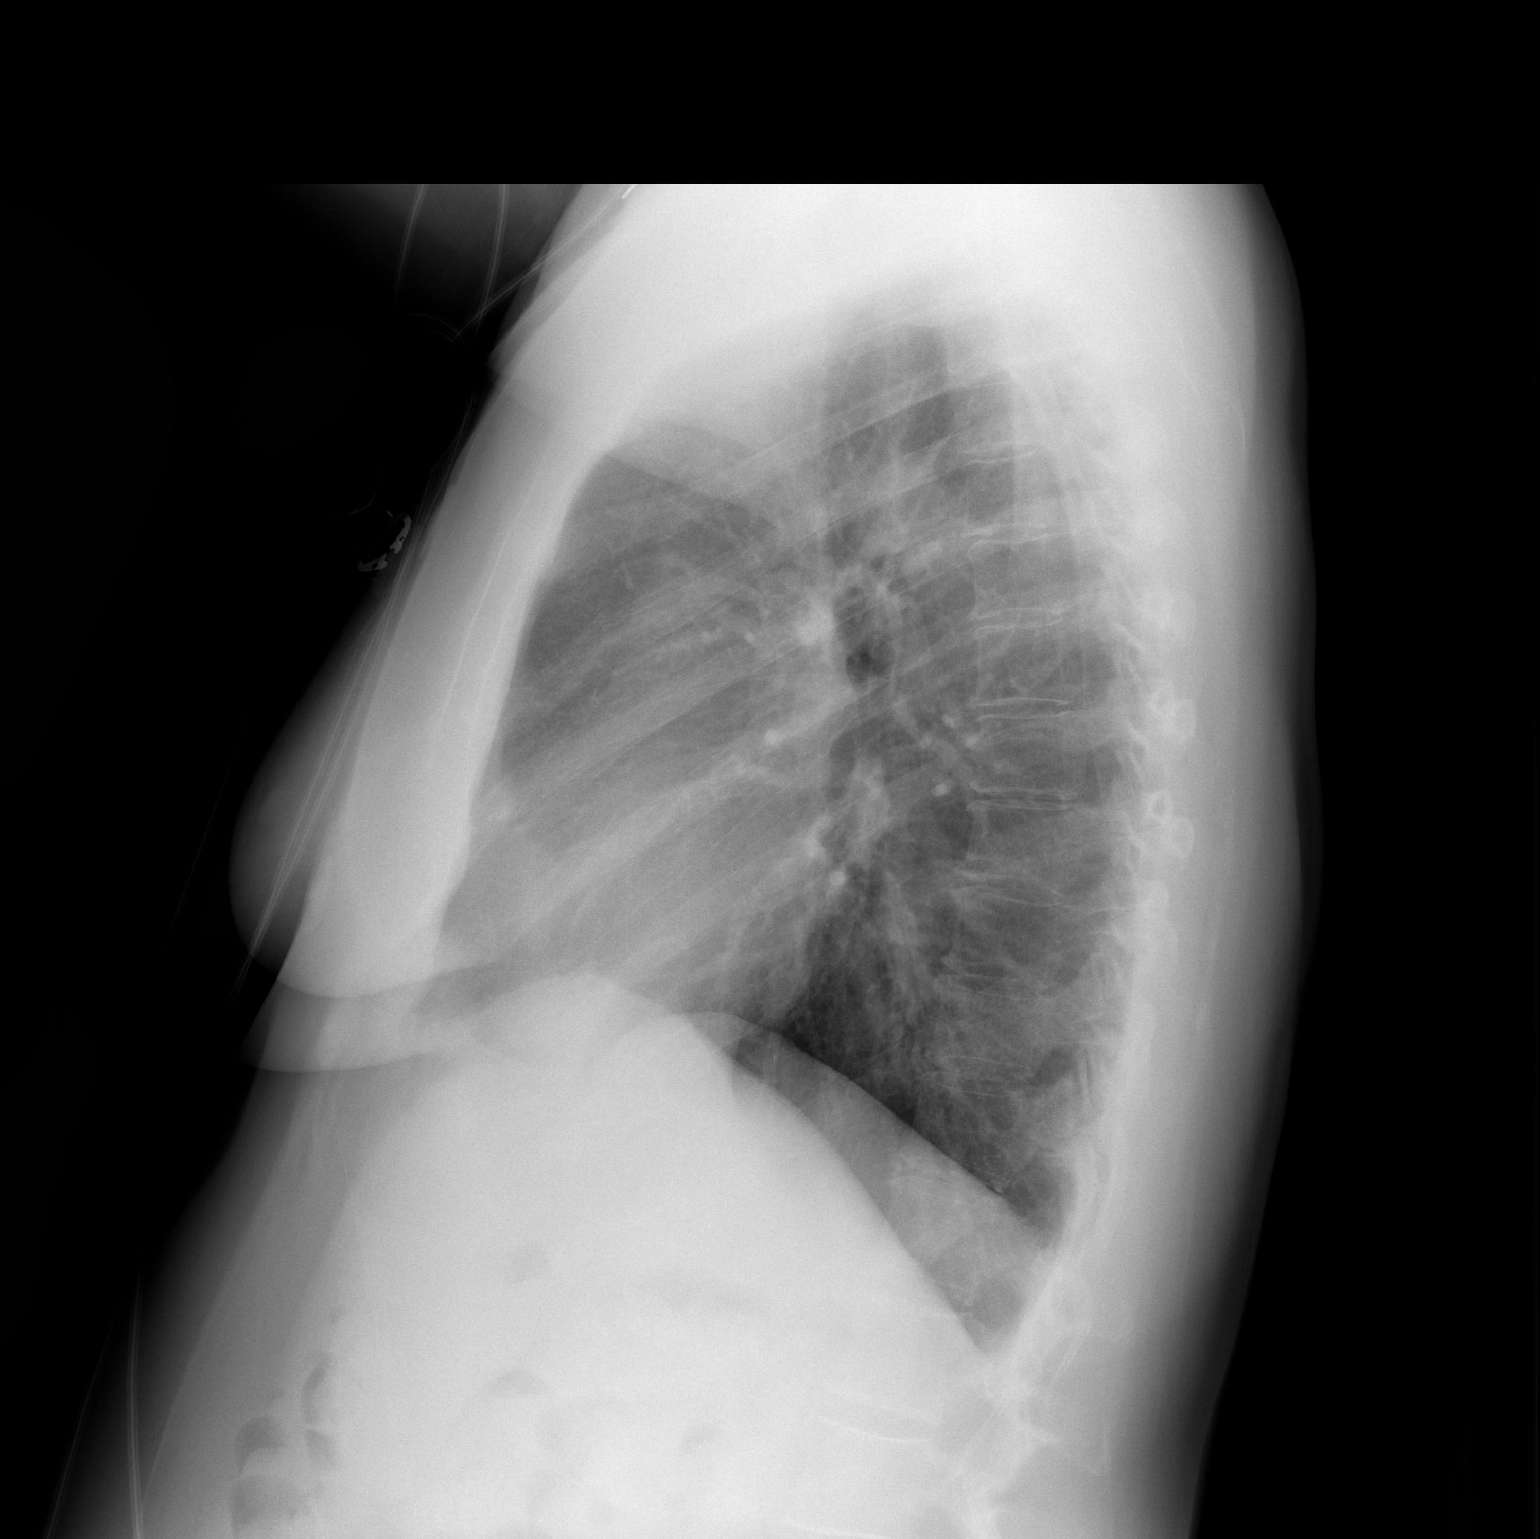

[2 of 2 positions shown; findings below may reference images not displayed]

FINDINGS: Lung volumes and mediastinal contours remain normal. Visualized
tracheal air column is within normal limits. Both lungs appear
stable and clear. Mild scoliosis. No acute osseous abnormality
identified. Negative visible bowel gas pattern.
IMPRESSION: Stable and negative.  No cardiopulmonary abnormality.

## 2021-09-07 ENCOUNTER — Encounter: Payer: Self-pay | Admitting: Gastroenterology

## 2023-01-18 ENCOUNTER — Encounter (HOSPITAL_BASED_OUTPATIENT_CLINIC_OR_DEPARTMENT_OTHER): Payer: Self-pay | Admitting: Emergency Medicine

## 2023-01-18 ENCOUNTER — Other Ambulatory Visit: Payer: Self-pay

## 2023-01-18 ENCOUNTER — Other Ambulatory Visit (HOSPITAL_BASED_OUTPATIENT_CLINIC_OR_DEPARTMENT_OTHER): Payer: Self-pay

## 2023-01-18 ENCOUNTER — Emergency Department (HOSPITAL_BASED_OUTPATIENT_CLINIC_OR_DEPARTMENT_OTHER)
Admission: EM | Admit: 2023-01-18 | Discharge: 2023-01-18 | Disposition: A | Discharge status: Home / Self Care | Payer: Self-pay | Attending: Emergency Medicine | Admitting: Emergency Medicine

## 2023-01-18 ENCOUNTER — Emergency Department (HOSPITAL_BASED_OUTPATIENT_CLINIC_OR_DEPARTMENT_OTHER): Payer: Self-pay

## 2023-01-18 DIAGNOSIS — E119 Type 2 diabetes mellitus without complications: Secondary | ICD-10-CM | POA: Insufficient documentation

## 2023-01-18 DIAGNOSIS — Z85828 Personal history of other malignant neoplasm of skin: Secondary | ICD-10-CM | POA: Insufficient documentation

## 2023-01-18 DIAGNOSIS — R1031 Right lower quadrant pain: Secondary | ICD-10-CM | POA: Insufficient documentation

## 2023-01-18 HISTORY — DX: Headache, unspecified: R51.9

## 2023-01-18 HISTORY — DX: Meniere's disease, unspecified ear: H81.09

## 2023-01-18 HISTORY — DX: Unspecified hearing loss, unspecified ear: H91.90

## 2023-01-18 HISTORY — DX: Tinnitus, unspecified ear: H93.19

## 2023-01-18 LAB — URINALYSIS, ROUTINE W REFLEX MICROSCOPIC
Bacteria, UA: NONE SEEN
Bilirubin Urine: NEGATIVE
Glucose, UA: >1000 mg/dL — AB
Hgb urine dipstick: NEGATIVE
Ketones, ur: NEGATIVE mg/dL
Nitrite: NEGATIVE
Protein, ur: NEGATIVE mg/dL
Specific Gravity, Urine: 1.025 (ref 1.005–1.030)
pH: 7 (ref 5.0–8.0)

## 2023-01-18 LAB — COMPREHENSIVE METABOLIC PANEL
ALT: 18 U/L (ref 0–44)
AST: 18 U/L (ref 15–41)
Albumin: 4.5 g/dL (ref 3.5–5.0)
Alkaline Phosphatase: 58 U/L (ref 38–126)
Anion gap: 9 (ref 5–15)
BUN: 20 mg/dL (ref 6–20)
CO2: 30 mmol/L (ref 22–32)
Calcium: 9.4 mg/dL (ref 8.9–10.3)
Chloride: 99 mmol/L (ref 98–111)
Creatinine, Ser: 0.91 mg/dL (ref 0.44–1.00)
GFR, Estimated: >60 mL/min (ref >60)
Glucose, Bld: 127 mg/dL — ABNORMAL HIGH (ref 70–99)
Potassium: 3.6 mmol/L (ref 3.5–5.1)
Sodium: 138 mmol/L (ref 135–145)
Total Bilirubin: 0.4 mg/dL (ref 0.3–1.2)
Total Protein: 7.8 g/dL (ref 6.5–8.1)

## 2023-01-18 LAB — CBC
HCT: 46.8 % — ABNORMAL HIGH (ref 36.0–46.0)
Hemoglobin: 16.1 g/dL — ABNORMAL HIGH (ref 12.0–15.0)
MCH: 31.6 pg (ref 26.0–34.0)
MCHC: 34.4 g/dL (ref 30.0–36.0)
MCV: 91.9 fL (ref 80.0–100.0)
Platelets: 305 10*3/uL (ref 150–400)
RBC: 5.09 MIL/uL (ref 3.87–5.11)
RDW: 13.2 % (ref 11.5–15.5)
WBC: 5.8 10*3/uL (ref 4.0–10.5)
nRBC: 0 % (ref 0.0–0.2)

## 2023-01-18 LAB — LIPASE, BLOOD: Lipase: 36 U/L (ref 11–51)

## 2023-01-18 MED ORDER — FENTANYL CITRATE PF 50 MCG/ML IJ SOSY
50.0000 ug | PREFILLED_SYRINGE | Freq: Once | INTRAMUSCULAR | Status: AC
  Administered 2023-01-18: 50 ug via INTRAVENOUS
  Filled 2023-01-18: qty 1

## 2023-01-18 MED ORDER — KETOROLAC TROMETHAMINE 30 MG/ML IJ SOLN
30.0000 mg | Freq: Once | INTRAMUSCULAR | Status: AC
  Administered 2023-01-18: 30 mg via INTRAVENOUS
  Filled 2023-01-18: qty 1

## 2023-01-18 MED ORDER — ONDANSETRON HCL 4 MG/2ML IJ SOLN
4.0000 mg | Freq: Once | INTRAMUSCULAR | Status: DC
  Filled 2023-01-18 (×2): qty 2

## 2023-01-18 MED ORDER — IOHEXOL 300 MG/ML  SOLN
100.0000 mL | Freq: Once | INTRAMUSCULAR | Status: AC | PRN
  Administered 2023-01-18: 100 mL via INTRAVENOUS

## 2023-01-18 MED ORDER — TRAMADOL HCL 50 MG PO TABS: 50.0000 mg | ORAL_TABLET | ORAL | 0 refills | Status: AC | PRN

## 2023-01-18 NOTE — Discharge Instructions (Addendum)
You were seen in the emergency department for right lower abdominal pain.  You had blood work urinalysis and a CAT scan of your abdomen and pelvis that did not show an obvious explanation for your symptoms.  Please start with a clear liquid diet advance as tolerated.  We are prescribing you tramadol to use for breakthrough pain.  Follow-up with your regular doctor.  Return to the emergency department if any high fevers or worsening symptoms.

## 2023-01-18 NOTE — ED Triage Notes (Signed)
Lower right side abdo cramps/pain goes into back Started this morning Nausea Took tramadol and zofran pta, some help

## 2023-01-18 NOTE — ED Provider Notes (Signed)
Signout from Dr. Bebe Shaggy.  57 year old female here with abdominal pain lower abdomen and right flank nausea.  Lab work fairly unremarkable.  She is pending CT abdomen and pelvis.  Disposition per results of scan. Physical Exam  BP 98/78 (BP Location: Left Arm)   Pulse (!) 49   Temp 97.8 F (36.6 C) (Axillary)   Resp 13   Ht 5\' 3"  (1.6 m)   Wt 72.6 kg   LMP 10/01/2012   SpO2 100%   BMI 28.34 kg/m   Physical Exam  Procedures  Procedures  ED Course / MDM   Clinical Course as of 01/18/23 1526  Wed Jan 18, 2023  1510 Signed out to dr Clifford Coudriet with CT imaging pending [DW]    Clinical Course User Index [DW] Zadie Rhine, MD   Medical Decision Making Amount and/or Complexity of Data Reviewed Labs: ordered. Radiology: ordered.  Risk Prescription drug management.   CT showed gallstones but no evidence of cholecystitis.  Also had a small fat-containing umbilical hernia.  I reviewed these results with patient and her husband.  She seems frustrated did not have an answer for her symptoms.  She said tramadol helps somewhat with her pain so she is asked me to refill a prescription for that.  We discussed return instructions.       Terrilee Files, MD 01/19/23 [phone]

## 2023-01-18 NOTE — ED Notes (Signed)
ED Provider at bedside. 

## 2023-01-18 NOTE — ED Provider Notes (Signed)
[place] EMERGENCY DEPARTMENT AT [facility] Provider Note   CSN: [id number] Arrival date & time: 01/18/23  1149     History  Chief Complaint  Patient presents with   Abdominal Pain    Sara Briggs is a 57 y.o. female.  The history is provided by the patient and the spouse.  Patient history of hyperlipidemia, hard of hearing, diabetes presents with right lower quadrant abdominal pain. She is started this morning started having pain in her right lower quadrant as well as into her right flank.  Reports nausea without vomiting.  No diarrhea.  No urinary symptoms.  She reports previous multiple abdominal surgeries Denies known history of kidney stone  Patient is hard of hearing, but does not request ASL interpreter.  She request her husband help with communication   Past Medical History:  Diagnosis Date   Allergic    Allergy    Cancer ([facility])    skin cancer, basal cell carcinoma   Diabetes mellitus without complication ([facility])    Frequent headaches    Hearing loss    HOH (hard of hearing)    Hyperlipidemia    IBS (irritable bowel syndrome)    Meniere's disease    Obesity    Rectocele    Tinnitus     Home Medications Prior to Admission medications   Medication Sig Start Date End Date Taking? Authorizing Provider  acetaminophen (TYLENOL) 500 MG tablet Take 500 mg by mouth every 6 (six) hours as needed for pain.    [provider]  cetirizine (ZYRTEC) 10 MG tablet Take 10 mg by mouth every evening.     [provider]  Clobetasol Propionate 0.025 % CREA Apply 1 application topically as needed.    [provider]  cyclobenzaprine (FLEXERIL) 10 MG tablet Take 10 mg by mouth 3 (three) times daily as needed for muscle spasms.    [provider]  Eszopiclone 3 MG TABS Take 1 tablet by mouth as needed. 08/22/17   [provider]  fluticasone (FLONASE) 50 MCG/ACT nasal spray Place 2 sprays into the nose daily.    [provider]  hydrocortisone (PROCTOSOL HC) 2.5 % rectal cream Place 1 application rectally 2 (two) times daily.    [provider]  meclizine (ANTIVERT) 25 MG tablet Take 1 tablet by mouth as needed. 05/09/17   [provider]  ondansetron (ZOFRAN ODT) 4 MG disintegrating tablet 4mg  ODT q6 hours prn nausea/vomit 10/15/12   Bethann Berkshire, MD  propranolol (INDERAL) 10 MG tablet Take 1 tablet (10 mg total) by mouth 3 (three) times daily as needed. 01/10/19   Rollene Rotunda, MD  rosuvastatin (CRESTOR) 5 MG tablet TAKE 1 TABLET BY MOUTH ONCE WEEKLY AT BEDTIME 01/23/20 01/22/21  Roslynn Amble A, PA  traMADol (ULTRAM) 50 MG tablet Take 50 mg by mouth as needed. 09/18/18   [provider]  zolpidem (AMBIEN) 5 MG tablet Take 5 mg by mouth at bedtime as needed for sleep.    [provider]      Allergies    Apple juice, Ancef [cefazolin], Metformin and related, Onglyza [saxagliptin], Tape, Percocet [oxycodone-acetaminophen], Sulfa antibiotics, and Vicodin [hydrocodone-acetaminophen]    Review of Systems   Review of Systems  Constitutional:  Negative for fever.  Gastrointestinal:  Positive for abdominal pain and nausea. Negative for constipation, diarrhea and vomiting.  Genitourinary:  Negative for difficulty urinating.    Physical Exam Updated Vital Signs BP 98/78 (BP Location: Left Arm)  Pulse (!) 49   Temp 97.8 F (36.6 C) (Axillary)   Resp 13   Ht 1.6 m (5\' 3" )   Wt 72.6 kg   LMP 10/01/2012   SpO2 100%   BMI 28.34 kg/m  Physical Exam CONSTITUTIONAL: Well developed/well nourished, uncomfortable appearing, hard of hearing HEAD: Normocephalic/atraumatic EYES: EOMI/PERRL ENMT: Mucous membranes moist NECK: supple no meningeal signs CV: S1/S2 noted, no murmurs/rubs/gallops noted LUNGS: Lungs are clear to auscultation bilaterally, no apparent distress ABDOMEN: soft, moderate RLQ tenderness, no rebound or guarding, bowel sounds noted throughout  abdomen GU: Right cva tenderness NEURO: Pt is awake/alert/appropriate, moves all extremitiesx4.  No facial droop.   EXTREMITIES: pulses normal/equal, full ROM SKIN: warm, color normal   ED Results / Procedures / Treatments   Labs (all labs ordered are listed, but only abnormal results are displayed) Labs Reviewed  COMPREHENSIVE METABOLIC PANEL - Abnormal; Notable for the following components:      Result Value   Glucose, Bld 127 (*)    All other components within normal limits  CBC - Abnormal; Notable for the following components:   Hemoglobin 16.1 (*)    HCT 46.8 (*)    All other components within normal limits  URINALYSIS, ROUTINE W REFLEX MICROSCOPIC - Abnormal; Notable for the following components:   Glucose, UA >1,000 (*)    Leukocytes,Ua SMALL (*)    All other components within normal limits  LIPASE, BLOOD    EKG None  Radiology No results found.  Procedures Procedures    Medications Ordered in ED Medications  ondansetron (ZOFRAN) injection 4 mg (4 mg Intravenous Not Given 01/18/23 1323)  fentaNYL (SUBLIMAZE) injection 50 mcg (50 mcg Intravenous Given 01/18/23 1245)  iohexol (OMNIPAQUE) 300 MG/ML solution 100 mL (100 mLs Intravenous Contrast Given 01/18/23 1312)    ED Course/ Medical Decision Making/ A&P Clinical Course as of 01/18/23 1510  Wed Jan 18, 2023  1510 Signed out to dr butler with CT imaging pending [DW]    Clinical Course User Index [DW] Zadie Rhine, MD                                 Medical Decision Making Amount and/or Complexity of Data Reviewed Labs: ordered. Radiology: ordered.  Risk Prescription drug management.   This patient presents to the ED for concern of abdominal pain, this involves an extensive number of treatment options, and is a complaint that carries with it a high risk of complications and morbidity.  The differential diagnosis includes but is not limited to cholecystitis, cholelithiasis, pancreatitis, gastritis,  peptic ulcer disease, appendicitis, bowel obstruction, bowel perforation, diverticulitis, AAA, ischemic bowel, kidney stone, pyelonephritis    Comorbidities that complicate the patient evaluation: Patient's presentation is complicated by their history of diabetes  Social Determinants of Health: Patient's  hard of hearing status   increases the complexity of managing their presentation  Additional history obtained: Additional history obtained from spouse Records reviewed Care Everywhere/External Records  Lab Tests: I Ordered, and personally interpreted labs.  The pertinent results include:  labs overall unremarkable  Imaging Studies ordered: I ordered imaging studies including CT scan abdomen pelvis    Medicines ordered and prescription drug management: I ordered medication including fentanyl for pain Reevaluation of the patient after these medicines showed that the patient    improved  Reevaluation: After the interventions noted above, I reevaluated the patient and found that they have :improved  Complexity of problems  addressed: Patient's presentation is most consistent with  acute presentation with potential threat to life or bodily function          Final Clinical Impression(s) / ED Diagnoses Final diagnoses:  None    Rx / DC Orders ED Discharge Orders     None         Zadie Rhine, MD 01/18/23 1511

## 2023-11-09 ENCOUNTER — Other Ambulatory Visit: Payer: Self-pay

## 2023-11-09 ENCOUNTER — Emergency Department (HOSPITAL_COMMUNITY)

## 2023-11-09 ENCOUNTER — Emergency Department (HOSPITAL_COMMUNITY)
Admission: EM | Admit: 2023-11-09 | Discharge: 2023-11-10 | Disposition: A | Discharge status: Home / Self Care | Attending: Emergency Medicine | Admitting: Emergency Medicine

## 2023-11-09 ENCOUNTER — Encounter (HOSPITAL_COMMUNITY): Payer: Self-pay

## 2023-11-09 DIAGNOSIS — R079 Chest pain, unspecified: Secondary | ICD-10-CM

## 2023-11-09 DIAGNOSIS — R0789 Other chest pain: Secondary | ICD-10-CM | POA: Insufficient documentation

## 2023-11-09 DIAGNOSIS — R339 Retention of urine, unspecified: Secondary | ICD-10-CM | POA: Diagnosis not present

## 2023-11-09 DIAGNOSIS — R1031 Right lower quadrant pain: Secondary | ICD-10-CM | POA: Insufficient documentation

## 2023-11-09 LAB — I-STAT CHEM 8, ED
BUN: 13 mg/dL (ref 6–20)
Calcium, Ion: 1 mmol/L — ABNORMAL LOW (ref 1.15–1.40)
Chloride: 103 mmol/L (ref 98–111)
Creatinine, Ser: 1.1 mg/dL — ABNORMAL HIGH (ref 0.44–1.00)
Glucose, Bld: 188 mg/dL — ABNORMAL HIGH (ref 70–99)
HCT: 46 % (ref 36.0–46.0)
Hemoglobin: 15.6 g/dL — ABNORMAL HIGH (ref 12.0–15.0)
Potassium: 4 mmol/L (ref 3.5–5.1)
Sodium: 137 mmol/L (ref 135–145)
TCO2: 25 mmol/L (ref 22–32)

## 2023-11-09 LAB — BASIC METABOLIC PANEL WITH GFR
Anion gap: 11 (ref 5–15)
BUN: 12 mg/dL (ref 6–20)
CO2: 24 mmol/L (ref 22–32)
Calcium: 8.8 mg/dL — ABNORMAL LOW (ref 8.9–10.3)
Chloride: 101 mmol/L (ref 98–111)
Creatinine, Ser: 1.01 mg/dL — ABNORMAL HIGH (ref 0.44–1.00)
GFR, Estimated: >60 mL/min (ref >60)
Glucose, Bld: 188 mg/dL — ABNORMAL HIGH (ref 70–99)
Potassium: 3.9 mmol/L (ref 3.5–5.1)
Sodium: 136 mmol/L (ref 135–145)

## 2023-11-09 LAB — CBC
HCT: 45.8 % (ref 36.0–46.0)
Hemoglobin: 15.4 g/dL — ABNORMAL HIGH (ref 12.0–15.0)
MCH: 31.6 pg (ref 26.0–34.0)
MCHC: 33.6 g/dL (ref 30.0–36.0)
MCV: 94 fL (ref 80.0–100.0)
Platelets: 301 K/uL (ref 150–400)
RBC: 4.87 MIL/uL (ref 3.87–5.11)
RDW: 12.8 % (ref 11.5–15.5)
WBC: 7.3 K/uL (ref 4.0–10.5)
nRBC: 0 % (ref 0.0–0.2)

## 2023-11-09 LAB — URINALYSIS, ROUTINE W REFLEX MICROSCOPIC
Bacteria, UA: NONE SEEN
Bilirubin Urine: NEGATIVE
Glucose, UA: >=500 mg/dL — AB
Hgb urine dipstick: NEGATIVE
Ketones, ur: NEGATIVE mg/dL
Leukocytes,Ua: NEGATIVE
Nitrite: NEGATIVE
Protein, ur: NEGATIVE mg/dL
Specific Gravity, Urine: 1.009 (ref 1.005–1.030)
pH: 7 (ref 5.0–8.0)

## 2023-11-09 LAB — HEPATIC FUNCTION PANEL
ALT: 16 U/L (ref 0–44)
AST: 19 U/L (ref 15–41)
Albumin: 4 g/dL (ref 3.5–5.0)
Alkaline Phosphatase: 54 U/L (ref 38–126)
Bilirubin, Direct: <0.1 mg/dL (ref 0.0–0.2)
Total Bilirubin: 0.7 mg/dL (ref 0.0–1.2)
Total Protein: 7.3 g/dL (ref 6.5–8.1)

## 2023-11-09 LAB — TROPONIN I (HIGH SENSITIVITY)
Troponin I (High Sensitivity): 2 ng/L (ref <18)
Troponin I (High Sensitivity): 3 ng/L (ref <18)

## 2023-11-09 LAB — D-DIMER, QUANTITATIVE: D-Dimer, Quant: 0.43 ug{FEU}/mL (ref 0.00–0.50)

## 2023-11-09 LAB — I-STAT CG4 LACTIC ACID, ED: Lactic Acid, Venous: 0.7 mmol/L (ref 0.5–1.9)

## 2023-11-09 MED ORDER — MORPHINE SULFATE (PF) 4 MG/ML IV SOLN
4.0000 mg | Freq: Once | INTRAVENOUS | Status: AC
  Administered 2023-11-09: 4 mg via INTRAVENOUS
  Filled 2023-11-09: qty 1

## 2023-11-09 MED ORDER — ONDANSETRON HCL 4 MG/2ML IJ SOLN
4.0000 mg | Freq: Once | INTRAMUSCULAR | Status: AC
  Administered 2023-11-09: 4 mg via INTRAVENOUS
  Filled 2023-11-09: qty 2

## 2023-11-09 MED ORDER — SODIUM CHLORIDE 0.9 % IV BOLUS
1000.0000 mL | Freq: Once | INTRAVENOUS | Status: AC
  Administered 2023-11-09: 1000 mL via INTRAVENOUS

## 2023-11-09 MED ORDER — IOHEXOL 350 MG/ML SOLN
75.0000 mL | Freq: Once | INTRAVENOUS | Status: AC | PRN
  Administered 2023-11-09: 75 mL via INTRAVENOUS

## 2023-11-09 NOTE — ED Notes (Signed)
 Patient transported to x-ray. ?

## 2023-11-09 NOTE — Discharge Instructions (Signed)
 Call your surgeon tomorrow and let them know about your visit here.  Please return for worsening pain fever inability eat or drink.

## 2023-11-09 NOTE — ED Provider Notes (Signed)
 [place] EMERGENCY DEPARTMENT AT [facility] Provider Note   CSN: [id number] Arrival date & time: 11/09/23  2013     Patient presents with: Chest Pain   Sara Briggs is a 58 y.o. female.   58 yo F with a chief complaints of chest pain.  This started earlier this evening.  And folic a pressure across the chest that radiated down the left arm.  She was watching TV when it happened.  She checked her heart rate on her watch and it was in the 140s.  She now feels a bit better but still has some discomfort about her chest.  She recently had a vaginal floor procedure done about 8 days ago.  Has been having some right lower quadrant abdominal pain post.  She thought maybe she was constipated or maybe it was due to indigestion and has been taking medications for this but has not had improvement.  She feels like it is reminiscent from when she had had a ovarian cyst that ruptured previously.     Chest Pain      Prior to Admission medications   Medication Sig Start Date End Date Taking? Authorizing Provider  acetaminophen  (TYLENOL ) 500 MG tablet Take 500 mg by mouth every 6 (six) hours as needed for pain.    [provider]  cetirizine (ZYRTEC) 10 MG tablet Take 10 mg by mouth every evening.     [provider]  Clobetasol Propionate 0.025 % CREA Apply 1 application topically as needed.    [provider]  cyclobenzaprine (FLEXERIL) 10 MG tablet Take 10 mg by mouth 3 (three) times daily as needed for muscle spasms.    [provider]  Eszopiclone 3 MG TABS Take 1 tablet by mouth as needed. 08/22/17   [provider]  fluticasone (FLONASE) 50 MCG/ACT nasal spray Place 2 sprays into the nose daily.    [provider]  hydrocortisone (PROCTOSOL HC) 2.5 % rectal cream Place 1 application rectally 2 (two) times daily.    [provider]  meclizine (ANTIVERT) 25 MG tablet Take 1 tablet by mouth as needed. 05/09/17   [provider]  ondansetron  (ZOFRAN  ODT) 4 MG disintegrating tablet 4mg  ODT q6 hours prn nausea/vomit 10/15/12   Zammit, Joseph, MD  propranolol  (INDERAL ) 10 MG tablet Take 1 tablet (10 mg total) by mouth 3 (three) times daily as needed. 01/10/19   Lavona Agent, MD  rosuvastatin (CRESTOR) 5 MG tablet TAKE 1 TABLET BY MOUTH ONCE WEEKLY AT BEDTIME 01/23/20 01/22/21  Richarda Neptune A, PA  traMADol  (ULTRAM ) 50 MG tablet Take 1 tablet (50 mg total) by mouth as needed. 01/18/23   Butler, Michael C, MD  zolpidem (AMBIEN) 5 MG tablet Take 5 mg by mouth at bedtime as needed for sleep.    [provider]    Allergies: Apple juice, Ancef [cefazolin], Metformin and related, Onglyza [saxagliptin], Tape, Percocet [oxycodone -acetaminophen ], Sulfa antibiotics, and Vicodin [hydrocodone -acetaminophen ]    Review of Systems  Cardiovascular:  Positive for chest pain.    Updated Vital Signs BP 126/80   Pulse 84   Temp 97.7 F (36.5 C) (Oral)   Resp 19   LMP 10/01/2012   SpO2 100%   Physical Exam Vitals and nursing note reviewed.  Constitutional:      General: She is not in acute distress.    Appearance: She is well-developed. She is not diaphoretic.  HENT:     Head: Normocephalic and atraumatic.  Eyes:  Pupils: Pupils are equal, round, and reactive to light.  Cardiovascular:     Rate and Rhythm: Normal rate and regular rhythm.     Heart sounds: No murmur heard.    No friction rub. No gallop.  Pulmonary:     Effort: Pulmonary effort is normal.     Breath sounds: No wheezing or rales.  Chest:     Chest wall: Tenderness present.     Comments: Tenderness on palpation of the anterior chest reproduces her discomfort Abdominal:     General: There is no distension.     Palpations: Abdomen is soft.     Tenderness: There is abdominal tenderness.     Comments: Pain to the right lower quadrant.  Negative rebound negative guarding.    Musculoskeletal:        General: No tenderness.      Cervical back: Normal range of motion and neck supple.  Skin:    General: Skin is warm and dry.  Neurological:     Mental Status: She is alert and oriented to person, place, and time.  Psychiatric:        Behavior: Behavior normal.     (all labs ordered are listed, but only abnormal results are displayed) Labs Reviewed  BASIC METABOLIC PANEL WITH GFR - Abnormal; Notable for the following components:      Result Value   Glucose, Bld 188 (*)    Creatinine, Ser 1.01 (*)    Calcium 8.8 (*)    All other components within normal limits  CBC - Abnormal; Notable for the following components:   Hemoglobin 15.4 (*)    All other components within normal limits  URINALYSIS, ROUTINE W REFLEX MICROSCOPIC - Abnormal; Notable for the following components:   Color, Urine STRAW (*)    Glucose, UA >=500 (*)    All other components within normal limits  I-STAT CHEM 8, ED - Abnormal; Notable for the following components:   Creatinine, Ser 1.10 (*)    Glucose, Bld 188 (*)    Calcium, Ion 1.00 (*)    Hemoglobin 15.6 (*)    All other components within normal limits  HEPATIC FUNCTION PANEL  D-DIMER, QUANTITATIVE  I-STAT CG4 LACTIC ACID, ED  TROPONIN I (HIGH SENSITIVITY)  TROPONIN I (HIGH SENSITIVITY)    EKG: EKG Interpretation Date/Time:  Thursday November 09 2023 20:30:35 EDT Ventricular Rate:  85 PR Interval:  163 QRS Duration:  99 QT Interval:  376 QTC Calculation: 448 R Axis:   52  Text Interpretation: Sinus rhythm Probable left atrial enlargement Baseline wander in lead(s) V6 No significant change since last tracing Confirmed by Emil Share [phone]) on 11/09/2023 9:03:27 PM  Radiology: CT ABDOMEN PELVIS W CONTRAST Result Date: 11/09/2023 CLINICAL DATA:  Right lower quadrant pain. Pelvic surgery 1 week ago. EXAM: CT ABDOMEN AND PELVIS WITH CONTRAST TECHNIQUE: Multidetector CT imaging of the abdomen and pelvis was performed using the standard protocol following bolus administration of  intravenous contrast. RADIATION DOSE REDUCTION: This exam was performed according to the departmental dose-optimization program which includes automated exposure control, adjustment of the mA and/or kV according to patient size and/or use of iterative reconstruction technique. CONTRAST:  75mL OMNIPAQUE  IOHEXOL  350 MG/ML SOLN COMPARISON:  CT abdomen and pelvis 01/18/2023 FINDINGS: Lower chest: No acute abnormality. Hepatobiliary: Gallstones are present. The gallbladder is distended. There is no biliary ductal dilatation. The liver is within normal limits. Pancreas: Unremarkable. No pancreatic ductal dilatation or surrounding inflammatory changes. Spleen: Normal in size without focal  abnormality. Adrenals/Urinary Tract: Foley catheter decompresses the bladder. The kidneys and adrenal glands are within normal limits. Stomach/Bowel: Stomach is within normal limits. Appendix appears normal. No evidence of bowel wall thickening, distention, or inflammatory changes. Vascular/Lymphatic: No significant vascular findings are present. No enlarged abdominal or pelvic lymph nodes. Reproductive: Uterus and bilateral adnexa are unremarkable. Other: There is a small fat containing umbilical hernia. There is no ascites. Musculoskeletal: No fracture is seen. IMPRESSION: 1. Cholelithiasis with distended gallbladder. Correlate clinically for acute cholecystitis. 2. Foley catheter decompresses the bladder. 3. Small fat containing umbilical hernia. Electronically Signed   By: Greig Pique M.D.   On: 11/09/2023 23:15   DG Chest 2 View Result Date: 11/09/2023 CLINICAL DATA:  Chest pain EXAM: CHEST - 2 VIEW COMPARISON:  Chest x-ray 03/11/2019 FINDINGS: The heart size and mediastinal contours are within normal limits. Both lungs are clear. The visualized skeletal structures are unremarkable. IMPRESSION: No active cardiopulmonary disease. Electronically Signed   By: Greig Pique M.D.   On: 11/09/2023 21:11     Procedures    Medications Ordered in the ED  morphine  (PF) 4 MG/ML injection 4 mg (4 mg Intravenous Given 11/09/23 2143)  ondansetron  (ZOFRAN ) injection 4 mg (4 mg Intravenous Given 11/09/23 2141)  sodium chloride  0.9 % bolus 1,000 mL (0 mLs Intravenous Stopped 11/09/23 2226)  iohexol  (OMNIPAQUE ) 350 MG/ML injection 75 mL (75 mLs Intravenous Contrast Given 11/09/23 2309)                                    Medical Decision Making Amount and/or Complexity of Data Reviewed Labs: ordered. Radiology: ordered.  Risk Prescription drug management.   58 yo F with a chief complaints of chest pain.  This started just prior to arrival.  Occurred while she was watching TV.  Seem to have significantly improved without intervention.  She has also been complaining of right lower quadrant abdominal pain.  Recently had a rectocele repair.  Will obtain a laboratory evaluation.  D-dimer.  Likely CT abdomen pelvis to assess for possible complication of her procedure.  Chest x-ray independently interpreted by me without focal infiltrate or pneumothorax.  D-dimer negative.  2 troponins are negative.  No leukocytosis.  CT abdomen pelvis without any obvious postsurgical complication.  Radiology read with cholelithiasis with some distention of the gallbladder.  Patient on initial exam without any obvious right upper quadrant pain.  No right upper quadrant pain on repeat exam.  Patient did have about a liter of urine removed with Foley catheter placement.  Unsure of the cause of the patient's acute urinary retention.  Will leave the Foley in place.  Have her follow-up with her surgeon in the office.  11:37 PM:  I have discussed the diagnosis/risks/treatment options with the patient and family.  Evaluation and diagnostic testing in the emergency department does not suggest an emergent condition requiring admission or immediate intervention beyond what has been performed at this time.  They will follow up with PCP. We also  discussed returning to the ED immediately if new or worsening sx occur. We discussed the sx which are most concerning (e.g., sudden worsening pain, fever, inability to tolerate by mouth) that necessitate immediate return. Medications administered to the patient during their visit and any new prescriptions provided to the patient are listed below.  Medications given during this visit Medications  morphine  (PF) 4 MG/ML injection 4 mg (4 mg Intravenous  Given 11/09/23 2143)  ondansetron  (ZOFRAN ) injection 4 mg (4 mg Intravenous Given 11/09/23 2141)  sodium chloride  0.9 % bolus 1,000 mL (0 mLs Intravenous Stopped 11/09/23 2226)  iohexol  (OMNIPAQUE ) 350 MG/ML injection 75 mL (75 mLs Intravenous Contrast Given 11/09/23 2309)     The patient appears reasonably screen and/or stabilized for discharge and I doubt any other medical condition or other [facility] requiring further screening, evaluation, or treatment in the ED at this time prior to discharge.       Final diagnoses:  Nonspecific chest pain  RLQ abdominal pain  Urinary retention    ED Discharge Orders     None          Emil Share, DO 11/09/23 2337

## 2023-11-09 NOTE — ED Notes (Signed)
 Patient transported to CT

## 2023-11-09 NOTE — ED Triage Notes (Signed)
 Pt is coming from home with complaints of chest pain, anxiety, lower right abd pain and some diarrhea as well. She had  cyst procedure  that occurred Wednesday of last week. She is ambulatory and cooperative.   Medic vitals   135/75 87hr 18rr 98%ra

## 2023-11-09 NOTE — ED Notes (Signed)
 Pt attempted to urine twice while in the ED, pt unable to void but feels like she needs to. Bladder scan done and showed about in bladder.

## 2023-11-10 NOTE — ED Notes (Signed)
 Foley education done with her and husband and supplies sent.

## 2024-01-06 NOTE — ED Provider Notes (Signed)
 EMERGENCY DEPARTMENT ENCOUNTER    CHIEF COMPLAINT   Chief Complaint  Patient presents with  . Urinary Retention    Pt had bladder surgery earlier this year, treated for UTI, 3 weeks ago, was started on 7 days abx, then came to the beach, started to c/o lower back pain, started to have flushing, pt is stating she is having pain pain and unable to urinate, started on macrobid yesterday,  Per family at bedside, pt started having hallucinations, but also did not sleep after taking lunesta,  Pt has not taken valium  for her Menire disease for the dizziness, pt also states she took a skelaxin possibly     HPI   Jenaya Saar is a 58 y.o. female who presents to the emergency department by POV with multiple friends at bedside, all are left on healthcare workers.  Patient has a complicated urinary history with 2 bladder reconstruction surgeries in her past, frequent urinary retention, she is on a beach vacation over the past few days, retaining urine on arrival with abdominal pain which is relieved after an In-N-Out catheter.  She has a traumatic catheter related event from prior hospitalization.  Patient is followed closely by [facility] urogynecology.  Surgery 11/01/2023, posterior for anterior repair cystoscopy.  Over the past 48 hours patient has been not sleeping, last night she took multiple medications, 1 Lunesta, 1 Skelaxin, possibly her baseline Valium  though between the patient and her friends it is unclear if she took all of these medications.  Her personality has changed significantly to come tearful, infantile, combative, paranoid over the last 12 hours prompting the visit to the ED.  The patient was started on Macrobid yesterday after a PCM phone call concerning for a return of her UTI.  She is taking 2 doses of Macrobid. This is corroborated via PCM note 01/05/2024 from Joen Meres, [place].  No fevers but patient is felt flushed, she also has right-sided lower back pain over the last 12  hours.  PAST MEDICAL HISTORY   Past Medical History:  Diagnosis Date  . Allergy    Seasonal  . Anxiety   . Bladder prolapse, female, acquired   . Cancer (*)    Basal ceel  . Cataracts, bilateral 2021   Mild  . Cystocele with rectocele   . Diabetes mellitus (*)   . Hard of hearing   . Hemorrhoids 1999   While pregnant  . IBS (irritable bowel syndrome)   . Inflammatory bowel disease 1989  . Meniere disease of both ears   . Migraine   . Rectocele     SURGICAL HISTORY   Past Surgical History:  Procedure Laterality Date  . Anterior and posterior vaginal repair    . Bladder neck reconstruction    . Colon surgery  2012   Anterior and posterior repair  . Tubal ligation      CURRENT MEDICATIONS   Current Medications[1]  ALLERGIES   Allergies[2]  FAMILY HISTORY   Family History  Problem Relation Age of Onset  . Hypertension Mother   . Hyperlipidemia Mother   . Diabetes type II Mother   . Arthritis Mother   . Diabetes Mother   . Hearing loss Mother   . Kidney disease Mother   . Sleep apnea Father   . Hyperlipidemia Father   . Hypertension Father   . Obesity Father   . Heart disease Brother        Stent placed  . Sleep apnea Brother   . Colon  cancer Maternal Grandfather     SOCIAL HISTORY   Social History[3]  REVIEW OF SYSTEMS   Constitutional:  Denies fever, chills, weight loss or weakness.   Eyes:  Denies photophobia or discharge.   HENT:  Denies sore throat or ear pain.   Respiratory:  Denies cough or shortness of breath.   Cardiovascular:  Denies chest pain, palpitations or swelling.   GI:  Denies abdominal pain, nausea, vomiting, or diarrhea.   GU: Denies hematuria, dysuria, urgency, frequency Musculoskeletal:  Denies back pain.   Skin:  Denies rash.   Neurologic:  Denies headache, focal weakness or sensory changes.   Endocrine:  Denies polyuria or polydipsia.   Lymphatic:  Denies swollen glands.   Psychiatric:  Denies depression, suicidal  ideation or homicidal ideation.   All other review of systems reviewed and are negative  PHYSICAL EXAM   VITAL SIGNS: BP 119/80   Pulse 78   Temp 98.1 F (36.7 C) (Oral)   Resp 16   Ht 1.651 m (5' 5)   Wt 79.4 kg (175 lb)   SpO2 99%   BMI 29.12 kg/m  Constitutional:  Well developed, well nourished, no acute distress, non-toxic appearance.   Eyes:  PERRL, EOMI,  conjunctiva normal, no discharge HENT: Atraumatic, Normocephalic, Ears clear bilaterally, normal TM's, nares clear bilaterally, normal oropharynx, no erythema, no tonsilar swelling, plaques or exudates. Neck:  Normal inspection, normal range of motion, supple, no meningeal signs, no lymphadenopathy Respiratory:  Normal breath sounds, no respiratory distress, no wheezing, no chest tenderness.  Cardiovascular:  Normal heart rate, normal rhythm, no murmurs, no rubs, no gallops.  Abdomen: Bowel sounds normal, Soft, Non tender, No masses, No pulsatile masses. No rebound, no guarding, or organomegly. Extremities:  Intact distal pulses, no edema, no tenderness, no cyanosis, no clubbing. Good range of motion in all major joints. No tenderness to palpation or major deformities noted.  Back: No midline thoracic or lumbar spine tenderness, no  CVA tenderness.  Skin:  Warm, dry, no erythema, no rash.  Neurologic:  Awake and alert x3, grossly normal cognition No focal deficits noted,  CN II-XII  Intact as tested  ED COURSE & MEDICAL DECISION MAKING   Pertinent labs & imaging studies reviewed.   Labs Reviewed  URINALYSIS W/MICRO REFLEX CULTURE - SYMPTOMATIC - Abnormal      Result Value   Urine Color Yellow     Urine Clarity Clear     Urine Specific Gravity 1.028     Urine pH 6.0     Urine Protein - Dipstick Negative     Urine Glucose >=1000 (*)    Urine Ketones Trace     Urine Bilirubin Negative     Urine Blood Negative     Urine Nitrite Negative     Urine Urobilinogen 0.2     Urine Leukocyte Esterase Negative     UA Microscopic  No Micro    CBC AND DIFFERENTIAL - Abnormal   WBC 8.3     RBC 5.09     HGB 15.6     HCT 47.0 (*)    MCV 92.3     MCH 30.6     MCHC 33.2     Plt Ct 321     RDW SD 44.4     MPV 9.1 (*)    NRBC% 0.0     Absolute NRBC Count 0.00     NEUTROPHIL % 78.9     LYMPHOCYTE % 14.6     MONOCYTE %  5.4     Eosinophil % 0.5     BASOPHIL % 0.5     IG% 0.1     ABSOLUTE NEUTROPHIL COUNT 6.56     ABSOLUTE LYMPHOCYTE COUNT 1.21     Absolute Monocyte Count 0.45     Absolute Eosinophil Count 0.04     Absolute Basophil Count 0.04     Absolute Immature Granulocyte Count 0.01    BASIC METABOLIC PANEL - Abnormal   Na 136     Potassium 3.0 (*)    Cl 94 (*)    CO2 23     AGAP 19 (*)    Glucose 169 (*)    BUN 13     Creatinine 0.74     Ca 9.4     BUN/CREAT RATIO 17.6     eGFR 94    URINE DRUGS OF ABUSE SCRN - Abnormal   Ur PH DOA Scr 6.0     Amphet Scr Negative     Barb Scr Negative     Benzo Scr Positive (*)    Cannab Scr Negative     Cocaine Scr Negative     Opiates Scr Negative     Meth Scr Negative     Oxyco Scr Negative     Fentanyl  Scr Negative     Buprenorphine Screen Negative    COVID-19, FLU A+B AND RSV - Normal   Flu A Negative     Flu B Negative     RSV PCR Negative     SARS-COV-2 Not Detected    LIPASE - Normal   Lipase 43    LACTIC ACID - Normal   Lactic Acid 1.1    CULTURE, BLOOD  CULTURE, BLOOD    58 year old female presents the emergency department with recent signs symptoms suggestive of a UTI after urinary tract infection was recently treated outpatient biotics.  PCM started on Macrobid yesterday after numerous medications as well as 48 hours with no sleep in addition to social stress patient personality changed, she became combative, and extremely emotional to her friends which is significant departure from her baseline personality.  On arrival to the ED patient's vital signs are reassuring without fever, no tachycardia, normal blood pressure and oxygenation on  room air.  She is tearful but otherwise alert and oriented.  Her urinalysis in the emergency department shows no evidence of inflammation however after 2 doses of Macrobid this is of unclear clinical significance.  Given her ongoing symptoms we will treat with IV antibiotics and blood cultures in the ED.  IV fluids initiated as well as labs to evaluate her presence or risk for sepsis.  Given her personality changes and altered mental status, head CT ordered as well as abdominal CT given her recent urinary retention, relieved with In-N-Out catheter arrival to the ED.  On exam patient's abdomen is nontender at arrival to the ED after cath.  CBC with no leukocytosis or evidence of anemia, electrolytes with mild hypokalemia, adequate renal function with creatinine 0.74, benzos are noted on her UDS which is consistent with her reported history of her daily benzo use.  Lactic acid of 1.1 is reassuring for severe sepsis.    CT studies with no intracranial mass or bleed, no acute surgical process noted in the abdomen or pelvis.  Patient reports she had to use the bathroom, tried to urinate despite significant urge and was unable to, over 1000 mL was again removed after a bladder scan showed significant urine.  Patient  is clearly retaining urine and requires a Foley catheter.  Offered self cath versus Foley catheter with leg bag and patient would like a Foley to go home to be removed after evaluation by her urogynecologist.  Patient stable for ongoing outpatient management.  Patient requested GI cocktail and oral dose of her home Valium  which was administered in the emergency room prior to her departure.  RADIOLOGY   I independently reviewed imaging studies and my interpretation includes: CT head and abdomen pelvis with no other cranial mass, no acute surgical process of the abdomen or pelvis.  I considered the following studies: Considered CT head neck but not indicated today.  See official radiology  interpretation below   CT Abdomen Pelvis W IV Contrast Result Date: 01/06/2024 CT ABDOMEN PELVIS W IV CONTRAST: COMPARISON: No comparison. TECHNIQUE: Utilizing automatic exposure control, helically acquired images were obtained from the lung bases through the symphysis pubis following IV contrast administration. CONTRAST: 80 mL of IOPAMIDOL 76 % IV SOLN, no reaction. FINDINGS: No acute findings lung bases. Normal liver, gallbladder, biliary tree, pancreas, spleen, adrenal glands, kidneys. Abdominal aorta normal caliber. No abdominal or pelvic adenopathy. No bowel obstruction or inflammatory changes. Tiny fat-containing umbilical hernia. Bladder mildly distended, no acute findings. Reproductive structures are normal. No free fluid. Lumbar spondylosis, no significant canal stenosis.   IMPRESSION: NO ACUTE FINDINGS. Electronically Signed by: Adine Morales on 01/06/2024 9:26 AM  CT Head WO Contrast Result Date: 01/06/2024 CT HEAD WO CONTRAST: TECHNIQUE: Utilizing automatic exposure control, helically acquired images were obtained without contrast administration. COMPARISON: No comparison. FINDINGS: No mass, hemorrhage, or acute infarct. Nondilated ventricles. Basilar cisterns patent. Orbits and paranasal sinuses unremarkable   IMPRESSION: NO ACUTE ABNORMALITY Electronically Signed by: Ozell PARAS. Fisher on 01/06/2024 9:22 AM   Previous outside medical records reviewed including PCM TCON from yesterday as well as [facility] urogyn note of 11/13/2023 as reported in the HPI above.  Emergent and life threatening differential diagnoses considered but not limited to: Sepsis, meningitis versus cephalitis, intracranial mass, polypharmacy, toxic ingestion  Pulse ox interpretation:  The pulse oximeter revealed SpO2 of 100%  on room air which is not hypoxic and normal for the patient as interpreted by me.   Labs:  Reviewed and independent interpretation by me.  See chart above.    Constraints to care: None  Meds  given normal saline, Rocephin  Patient is stable for discharge with close follow up and strict return precautions. All questions were answered and patient indicates understanding and agreement with plan of care. Patient desires to go home.   Portions of this note were written with Dragon voice recognition technology and this may increase the likelihood of typos or grammar errors. Not all errors are caught and/or corrected.  Please contact the author if the meaning or intent of this chart are in question.   James A. Faustino, MD   FINAL IMPRESSION   Final diagnoses:  Polypharmacy  Insomnia, unspecified type  Acute cystitis without hematuria  Urinary retention    ED Disposition     ED Disposition  Discharge   Condition  Stable   Comment  --               [1] Current Facility-Administered Medications  Medication Dose Route Frequency Provider Last Rate Last Admin  . diazepam  (VALIUM ) tablet 5 mg  5 mg Oral ONCE Lynwood DELENA Faustino, MD      . GI Cocktail 30 mL plus lidocaine  viscous 2% 5 mL (mix on floor)  35 mL Oral ONCE Lynwood DELENA Phenix, MD      . potassium chloride  10 mEq in SW 100 mL ivpb - premix  10 mEq IntraVENous ONCE Lynwood DELENA Phenix, MD 100 mL/hr at 01/06/24 0939 10 mEq at 01/06/24 9060   Current Outpatient Medications  Medication Sig Dispense Refill  . ACCU-CHEK SOFTCLIX LANCETS lancets Use to monitor blood glucose 2 time(s) daily. 100 each 2  . Betahistine HCl POWD 24 mg by Does not apply route 2 (two) times daily.    SABRA Black Elderberry (SAMBUCUS ELDERBERRY) 50 MG/5ML SYRP Take by mouth as needed.    . Blood Glucose Monitoring Suppl (ACCU-CHEK GUIDE) w/Device KIT Use to check blood sugars up to twice daily. 1 kit 0  . cephALEXin (KEFLEX) 500 mg capsule Take one capsule (500 mg dose) by mouth 3 (three) times a day for 5 days. 15 capsule 0  . cetirizine (ZYRTEC) 10 mg tablet Take one tablet (10 mg dose) by mouth as needed.    . clobetasol (TEMOVATE) 0.05 % ointment Apply  topically as needed. 30 g 0  . Clobetasol Propionate 0.025 % CREA Apply topically as needed.    . Cranberry 200 MG CAPS Take 1 capsule by mouth 2 (two) times daily.    . cyclobenzaprine (FLEXERIL) 10 mg tablet Take one tablet (10 mg dose) by mouth with meals as needed.    . D-Mannose 500 MG CAPS Take 1 capsule by mouth daily.    . diazepam  (VALIUM ) 5 mg tablet Take one tablet (5 mg dose) by mouth daily as needed. Max Daily Amount: 5 mg 30 tablet 1  . empagliflozin (JARDIANCE) 25 mg TABS tablet Take one tablet (25 mg dose) by mouth daily.    . estradiol  (ESTRACE ) 0.1 mg/gram vaginal cream Place one half g vaginally daily for 14 days, THEN one half g 3 (three) times a week. 42.5 g 3  . eszopiclone (LUNESTA) 3 mg tablet TAKE 1 TABLET BY MOUTH IMMEDIATELY BEFORE BEDTIME AS NEEDED FOR SLEEP 90 tablet 0  . fluticasone propionate (FLONASE) 50 mcg/actuation nasal spray two sprays by Nasal route.    . Glucosamine Sulfate 750 MG TABS Take 2 tablets by mouth daily.    SABRA glucose blood (ACCU-CHEK GUIDE) test strip Use to monitor blood glucose 2 time(s) daily 100 each 2  . hydrocortisone (ANUSOL-HC,PROCTOSOL,PROCTOZONE,PROCTO-MED) 2.5% rectal cream Place rectally 2 (two) times a day as needed. 28 g 2  . hydrocortisone 2.5 % cream Place one Application rectally.    . ibuprofen  (ADVIL ,MOTRIN ) 800 mg tablet Take 1 tablet every 8 hours as needed for pain 30 tablet 0  . linaGLIPtin  (TRADJENTA ) 5 mg tablet Take one tablet (5 mg dose) by mouth daily.    SABRA loratadine (CLARITIN) 10 MG tablet Take one tablet (10 mg dose) by mouth daily.    . magnesium  oxide 400 TABS Take 500 mg by mouth daily.    . metaxalone (SKELAXIN) 400 MG tablet Take by mouth as needed.    . nitrofurantoin, macrocrystal-monohydrate, (MACROBID) 100 mg capsule Take one capsule (100 mg dose) by mouth 2 (two) times daily for 7 days. 14 capsule 0  . traMADol  (ULTRAM ) 50 mg tablet Take one tablet (50 mg dose) by mouth as needed.    .  triamterene -hydrochlorothiazide  (DYAZIDE ) 37.5-25 mg per capsule Take one capsule by mouth daily. 90 capsule 1  [2] Allergies Allergen Reactions  . Apple Rash and Swelling    Mouth and tongue swelling  . Skin Adhesives Itching  takes off skin  . Darvon [Propoxyphene] Rash  . Hydrocodone -Acetaminophen  Rash  . Oxycodone -Acetaminophen  Rash  . Pineapple Rash  . Sulfa Antibiotics Rash  [3] Social History Socioeconomic History  . Marital status: Married  Tobacco Use  . Smoking status: Never    Passive exposure: Never  . Smokeless tobacco: Never  Vaping Use  . Vaping status: Never Used  Substance and Sexual Activity  . Alcohol use: Never  . Drug use: Never   Lynwood DELENA Phenix, MD 01/06/24 [phone]

## 2024-01-07 NOTE — ED Notes (Signed)
 Pt in room calm, NAD, less anxious than prior, resting in bed voicing no complaints at this time.

## 2024-01-07 NOTE — ED Provider Notes (Signed)
 [facility] [place] [facility]  ED Provider Note  Sara Briggs 58 y.o. female DOB: December 21, 1965 MRN: [id number] History   Chief Complaint  Patient presents with  . Medical Problem    To ED with chest pain, shortness of breath, hallucinations. Seen at [facility] earlier today for same. States she feels like she needs help, she needs an epi pen. Listening to gospel music in triage.     Medical Problem   58 year old female presents to the emergency department with her husband and daughter with concern for erratic behavior.  Her husband reports that she went to a church retreat on Wednesday, and she had erratic behavior there; her friends noted that she was at times hysterical, laughing and then the next moment crying.  She also was having hallucinations, seeing the painting in the room move and it really upset her.  They report that she has been agitated, restless, and has not slept for nearly 4 days.  Per patient and husband, she has no psychiatric history, and has never had previous hallucinations or agitated behavior like this in the past.  He reports paranoid behavior, unclear what is causing this.  Did report that her medications are Valium  for Mnire's disease, Lunesta.   Patient's main complaint is of insomnia, wanting something to go to sleep.  She does complain of chest pain across the upper part of her chest.  She also endorses some discomfort in her bladder after having Foley catheter placed today.  She denies SI, HI, but is having visual hallucinations. Pt stating PTA she was having visual hallucinations where she was seeing spiders, snakes, and donald duck. Pt reports she is no longer experiencing hallucinations at this time. She denies any new medications besides her Valium  or Lunesta.  Notably, patient was seen at [facility] earlier today for similar.  She had extensive workup there including blood work, CT head, CT abdomen pelvis.  She was found to  have urinary retention, and a Foley catheter was placed; she does have a history of a complicated urinary history, s/p 2 bladder reconstruction surgery, frequent urinary retention.  She had a lot of abdominal discomfort upon arrival to the emergency department, but significant relief after catheter was placed.    Past Medical History:  Diagnosis Date  . Allergy    Seasonal  . Anxiety   . Bladder prolapse, female, acquired   . Cancer (*)    Basal ceel  . Cataracts, bilateral 2021   Mild  . Cystocele with rectocele   . Diabetes mellitus (*)   . Hard of hearing   . Hemorrhoids 1999   While pregnant  . IBS (irritable bowel syndrome)   . Inflammatory bowel disease 1989  . Meniere disease of both ears   . Migraine   . Rectocele     Past Surgical History:  Procedure Laterality Date  . Anterior and posterior vaginal repair    . Bladder neck reconstruction    . Colon surgery  2012   Anterior and posterior repair  . Tubal ligation      Social History   Substance and Sexual Activity  Alcohol Use Never   Tobacco Use History[1] E-Cigarettes  . Vaping Use Never User   . Start Date    . Cartridges/Day    . Quit Date     Social History   Substance and Sexual Activity  Drug Use Never         Allergies[2]  Home Medications  ACCU-CHEK SOFTCLIX LANCETS LANCETS    Use to monitor blood glucose 2 time(s) daily.   BETAHISTINE HCL POWD    24 mg by Does not apply route 2 (two) times daily.   BLACK ELDERBERRY (SAMBUCUS ELDERBERRY) 50 MG/5ML SYRP    Take by mouth as needed.   BLOOD GLUCOSE MONITORING SUPPL (ACCU-CHEK GUIDE) W/DEVICE KIT    Use to check blood sugars up to twice daily.   CEPHALEXIN (KEFLEX) 500 MG CAPSULE    Take one capsule (500 mg dose) by mouth 3 (three) times a day for 5 days.   CETIRIZINE (ZYRTEC) 10 MG TABLET    Take one tablet (10 mg dose) by mouth as needed.   CLOBETASOL (TEMOVATE) 0.05 % OINTMENT    Apply topically as needed.   CLOBETASOL PROPIONATE 0.025  % CREA    Apply topically as needed.   CRANBERRY 200 MG CAPS    Take 1 capsule by mouth 2 (two) times daily.   CYCLOBENZAPRINE (FLEXERIL) 10 MG TABLET    Take one tablet (10 mg dose) by mouth with meals as needed.   D-MANNOSE 500 MG CAPS    Take 1 capsule by mouth daily.   DIAZEPAM  (VALIUM ) 5 MG TABLET    Take one tablet (5 mg dose) by mouth daily as needed. Max Daily Amount: 5 mg   EMPAGLIFLOZIN (JARDIANCE) 25 MG TABS TABLET    Take one tablet (25 mg dose) by mouth daily.   ESTRADIOL  (ESTRACE ) 0.1 MG/GRAM VAGINAL CREAM    Place one half g vaginally daily for 14 days, THEN one half g 3 (three) times a week.   ESZOPICLONE (LUNESTA) 3 MG TABLET    TAKE 1 TABLET BY MOUTH IMMEDIATELY BEFORE BEDTIME AS NEEDED FOR SLEEP   FLUTICASONE PROPIONATE (FLONASE) 50 MCG/ACTUATION NASAL SPRAY    two sprays by Nasal route.   GLUCOSAMINE SULFATE 750 MG TABS    Take 2 tablets by mouth daily.   GLUCOSE BLOOD (ACCU-CHEK GUIDE) TEST STRIP    Use to monitor blood glucose 2 time(s) daily   HYDROCORTISONE (ANUSOL-HC,PROCTOSOL,PROCTOZONE,PROCTO-MED) 2.5% RECTAL CREAM    Place rectally 2 (two) times a day as needed.   HYDROCORTISONE 2.5 % CREAM    Place one Application rectally.   IBUPROFEN  (ADVIL ,MOTRIN ) 800 MG TABLET    Take 1 tablet every 8 hours as needed for pain   LINAGLIPTIN  (TRADJENTA ) 5 MG TABLET    Take one tablet (5 mg dose) by mouth daily.   LORATADINE (CLARITIN) 10 MG TABLET    Take one tablet (10 mg dose) by mouth daily.   MAGNESIUM  OXIDE 400 TABS    Take 500 mg by mouth daily.   METAXALONE (SKELAXIN) 400 MG TABLET    Take by mouth as needed.   NITROFURANTOIN, MACROCRYSTAL-MONOHYDRATE, (MACROBID) 100 MG CAPSULE    Take one capsule (100 mg dose) by mouth 2 (two) times daily for 7 days.   TRAMADOL  (ULTRAM ) 50 MG TABLET    Take one tablet (50 mg dose) by mouth as needed.   TRIAMTERENE -HYDROCHLOROTHIAZIDE  (DYAZIDE ) 37.5-25 MG PER CAPSULE    Take one capsule by mouth daily.    Primary Survey    Exposure   No visible chest trauma.        Review of Systems   Review of Systems  All other systems reviewed and are negative.   Physical Exam   ED Triage Vitals  BP 01/06/24 2348 129/79  Heart Rate 01/06/24 2346 99  Resp 01/06/24 2346 (!) 27  SpO2 01/06/24 2346 99 %  Temp 01/06/24 2348 98.1 F (36.7 C)    Physical Exam  Constitutional: She appears well-developed and well-nourished. She does not appear ill and no respiratory distress.  Appears restless, agitated  HENT:  Head: Normocephalic and atraumatic.  Right Ear: Normal external ear.  Left Ear: Normal external ear.  Mouth/Throat: Voice normal.  Eyes: EOM are intact. Conjunctivae are normal. Pupils are equal, round, and reactive to light.  Neck: Voice normal. Neck supple.  Cardiovascular: Normal rate, regular rhythm and normal heart sounds.  No audible murmur. No friction rub.  Pulmonary/Chest: No respiratory distress. Not tachypneic. Respiratory effort normal and breath sounds normal. No visible chest trauma.  Abdominal: Soft. There is no abdominal tenderness. There is no guarding and no rebound.  Musculoskeletal:     Cervical back: Neck supple.   Neurological: She is alert and oriented to person, place, and time. Moves all extremities equally. She has normal speech.  Skin: Skin is warm. Skin is dry.  Psychiatric: Speech is tangential. She is agitated. Thought content normal. She appears anxious.     ED Course   Lab results:   CBC - Abnormal      Result Value   WBC 10.0     RBC 4.90     HGB 15.3     HCT 45.4 (*)    MCV 92.7     MCH 31.2     MCHC 33.7     Plt Ct 331     RDW SD 45.5     MPV 9.0 (*)    NRBC% 0.0     Absolute NRBC Count 0.00    COMPREHENSIVE METABOLIC PANEL - Abnormal   Na 134 (*)    Potassium 3.5 (*)    Cl 96 (*)    CO2 21     AGAP 17 (*)    Glucose 225 (*)    BUN 13     Creatinine 0.94     Ca 9.0     ALK PHOS 89     T Bili 0.5     Total Protein 7.8     Alb 4.3      GLOBULIN 3.5     ALBUMIN/GLOBULIN RATIO 1.2     BUN/CREAT RATIO 13.8     ALT 19     AST 26     eGFR 70     Comment: Normal GFR (glomerular filtration rate) > 60 mL/min/1.73 meters squared, < 60 may include impaired kidney function. Calculation based on the Chronic Kidney Disease Epidemiology Collaboration (CK-EPI)equation refit without adjustment for race.  GEN5 CARDIAC TROPONIN T (TNT5) BASELINE - Normal   TnT-Gen5 (0hr) 5     Comment: Assay results < 6 ng/L and > 10,000 ng/L are reported as 5 ng/L and 10,001 ng/L respectively to reflect the absolute reportable range of TnTGen5.  An elevated Troponin indicates myocardial damage. Elevated troponin may also be due to pulmonary emboli, aortic dissection, heart failure, trauma, toxins and ischemia in the setting of critical illness.   GEN5 CARDIAC TROPONIN T(TNT5) 1 HOUR - Normal   TnT-Gen5 (1hr) 5     Comment: Assay results < 6 ng/L and > 10,000 ng/L are reported as 5 ng/L and 10,001 ng/L respectively to reflect the absolute reportable range of TnTGen5.  An elevated Troponin indicates myocardial damage. Elevated troponin may also be due to pulmonary emboli, aortic dissection, heart failure, trauma, toxins and ischemia in the setting of critical illness.   Delta 1  Hour 0    GEN5 CARDIAC TROPONIN T(TNT5) 3 HOUR    Imaging:   XR CHEST AP PORTABLE   Narrative:    EXAM: XR CHEST AP PORTABLE  INDICATION: Chest Pain  COMPARISON: none  FINDINGS:  There is no cardiomegaly or pulmonary vascular congestion. There is no acute infiltrate. Lungs are clear. There is no pleural effusion or pneumothorax seen.     Impression:    IMPRESSION:  No acute abnormality.   Electronically Signed by: Sonny Livings, MD on 01/07/2024 12:25 AM     ECG: ECG Results          ECG 12 lead (In process)  Result time 01/07/24 02:58:47    In process             Narrative:   Diagnosis Class Abnormal Acquisition Device D3K Systolic BP  129 Diastolic BP 79 Ventricular Rate 91 Atrial Rate 91 P-R Interval 152 QRS Duration 86 Q-T Interval 398 QTC Calculation(Bazett) 489 Calculated P Axis 59 Calculated R Axis 67 Calculated T Axis 24  Diagnosis Normal sinus rhythm Nonspecific T wave abnormality Prolonged QT Abnormal ECG When compared with ECG of 06-Jan-2024 23:46, Nonspecific T wave abnormality now evident in Anterior leads QT has shortened                         ECG 12 lead (In process)  Result time 01/07/24 00:04:08    In process             Narrative:   Diagnosis Class Abnormal Acquisition Device D3K Ventricular Rate 99 Atrial Rate 99 P-R Interval 142 QRS Duration 90 Q-T Interval 446 QTC Calculation(Bazett) 572 Calculated P Axis 64 Calculated R Axis 72 Calculated T Axis 45  Diagnosis Normal sinus rhythm Nonspecific ST abnormality Abnormal ECG No previous ECGs available                                                                                          Pre-Sedation Procedures  ED Course as of 01/07/24 0315  Sydell A Khaja's Documentation  Sun Jan 07, 2024  0314 Discussed with psych provider, recommending discharge at this time.  Patient and family feels comfortable with this as well.  Outpatient resources were provided by psychiatry team.  Stable for discharge.  Normal cardiac enzymes, reassuring from a cardiac standpoint.  Patient asymptomatic at this time.   Medical Decision Making 58 year old female presenting for evaluation of erratic/labile behavior.  At times crying, laughing, insomnia, has not been sleeping over the past several days.  Family concern for this new behavior, no previous psychiatric history.  Patient does take Lunesta and Valium  for Mnire's disease.  Patient was seen earlier today at [facility], and her presentation was attributed to possibly polypharmacy.  They did extensive workup including  blood work, CT head, CT abdomen/pelvis.  Urinalysis there did not reveal any signs of cystitis, however patient was recently on Macrobid.  She states that she was switched to Keflex earlier today at [facility].  She had UDS which showed benzodiazepines, consistent with her known Valium .  They placed a Foley catheter  for urinary retention and she was ultimately discharged.  She presents today with husband and daughter with concern for her ongoing erratic behavior.  Differential diagnosis includes acute psychotic episode, schizophrenia, delirium in the setting of infection though I believe this is less likely given a normal urinalysis earlier today, and no other signs of infection on exam.  Could be related to polypharmacy given that she does take Lunesta and Valium .  She denies any other drug use that may have triggered this event.  She did complain of some chest pain, so EKG and cardiac enzymes were obtained.  EKG shows normal sinus rhythm without any ischemic changes.  Cardiac troponins initially within normal notes of 5, delta pending.  She is stable from a medical standpoint, and BH access consult placed for further evaluation.   Amount and/or Complexity of Data Reviewed Labs: ordered. Radiology: ordered. ECG/medicine tests: ordered.          Provider Communication  New Prescriptions   No medications on file    Modified Medications   No medications on file    Discontinued Medications   No medications on file    Clinical Impression Final diagnoses:  Hallucinations  Insomnia, unspecified type    ED Disposition     ED Disposition  Discharge   Condition  Stable   Comment  --                   Electronically signed by:       [1] Social History Tobacco Use  Smoking Status Never  . Passive exposure: Never  Smokeless Tobacco Never  [2] Allergies Allergen Reactions  . Apple Rash and Swelling    Mouth and tongue swelling  . Skin Adhesives  Itching    takes off skin  . Darvon [Propoxyphene] Rash  . Hydrocodone -Acetaminophen  Rash  . Oxycodone -Acetaminophen  Rash  . Pineapple Rash  . Sulfa Antibiotics Rash   Sydell DELENA Sartorius, MD 01/07/24 [phone]

## 2024-01-07 NOTE — ED Notes (Signed)
 Psychiatry Provider on telehealth zoom with pt at this time

## 2024-01-07 NOTE — ED Provider Notes (Signed)
 The patient is currently medically stable, and appropriate for behavioral health evaluation. 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 Patient would benefit from psychiatric evaluation to lend expertise and help determine the best disposition and treatment plan.  To ensure ongoing care as patient awaits BH evaluation, the following measures have been taken: -All acute medical issues identified on ED evaluation have been addressed  -Behavioral Health ED holding orders have been placed (including CIWA/COWS orders as needed) -Home Medications will be addressed through medication reconciliation process -Dietary orders, including blood sugar control, have been placed.    Sydell DELENA Sartorius, MD 01/07/24 [id number]    Sydell DELENA Sartorius, MD 01/07/24 [phone]

## 2024-01-07 NOTE — Progress Notes (Signed)
 [facility] - Tele-Behavioral Health Diagnostic Evaluation Visit was conducted with the use of interactive audio and video telecommunications systems that permits real time communication between provider and patient  Patient location at time of Tele-Consult: [facility] Provider Location at time of Tele-Consult: Remote Patient Name:  Sara Briggs Date of Birth:  November 15, 1965  Today's Date:  January 07, 2024  46 minutes spent related to assessment and crisis stabilization.   Today's encounter included additional complexity due to: collateral from SO Provisional Diagnosis  Provisional Diagnosis: F23, Brief Psychotic d/o Primary Presenting Problem: Mental Health Behavioral Health Acuity: Level 1B  Formulation: Sara Briggs is a 58 y.o. female with a history of Anxiety who presented to the Patient location at time of Tele-Consult: [facility] ED via car. Patient is voluntarily with report of Medical Problem. Patient presents in ED due to medical issue. UDS is positive for benzos. BAL is less than ten. Substance Use reported by patient includes: benzodiazepines and rx. Withdrawal potential from substances include: denies any and/or problems related to substance use which include: denies any.  On interview the patient appears to be older than stated age with decreased motor activity and minimal speech. Observed behaviors include patient is cooperative with a normal range affect and ashamed/humiliated mood. Patient thought process includes wdl (denies current VH/AH) with poor insight.   Pt presented to ED with chest pain, shortness of breath and hallucinations. Reports she has not been sleeping x last couple of days due to bladder infection. Husband and pt report this is new onset of hallucinations. Husband and pt report, "she is better now", per pt. Denies past or current hx of SI/HI. Denies current  hallucinations. Husband feels like hallucinations were associated with decreased sleep.  Patient presents with depression symptoms of: crying spells, mood swings, low energy/fatigue, and concentration problems. Patient presents with mania symptoms of: none. Patient presents with anxiety symptoms of: excessive worry and nervousness.  Patient reports changes to sleep pattern, which include decreased sleeping, early morning awakening, frequent awakening, difficulty falling asleep, and use of sleep aid see med list. Patient reports changes to appetite, which include decreased appetite.  Current stressors include:   Bladder infection, decreased sleep.    Protective factors for the patient include:   treatable psychiatric disorder and symptoms, positive family connectedness, feels supported, no access to firearms, optimism that change can occur, protective religious beliefs, married, living with family, and strong responsibility for children.   Risk factors for patient include:   Suicidal Ideations: denies suicidal ideations.  Access to firearms: no.  Access to Lethal Means: sharps/knives.  Homicidal Ideations: denies homicidal ideations.  Psychosis of: auditory hallucinations, visual hallucinations, and denies current hallucinations.  Patient's history of trauma/abuse includes none.  Treatment history includes:  not actively engaged in OPT.  Collateral Contact includes:  Communication was obtained from husband in person.  Collateral contact reports pt is calm now and not experiencing hallucinations. Reports he feels she is safe to return home.    Based on my assessment, the recommendation is for discharge to outpatient level of care. BH Consult reviewed with ED provider, Dr. Sydell Sartorius, MD, who was informed of the disposition plan and is in agreement with the plan. ED provider confirms patient's commitment status at present to be voluntary. Based on the [facility] Consult findings, the  patient's commitment status will be remain voluntary. Next steps will include discharge to outpatient level of care with resources provided.  Disposition  ED Provider Contact Name: Dr. Sydell Sartorius, MD MD Contact Date: 01/07/24 MD Contact Time: 0302 Disposition Recommendation: Outpatient Admission Type: Voluntary Reason Not Admitted: Not Homicidal;Not Suicidal;Not Psychotic (denies current hallucinations)     Triage Screen  ED Triage Access Screening : The patient did not arrive under IVC or TDO.;There is no indication or report of substance dependency.;The patient is not experiencing suicidal/homicidal ideations.;The patient is not exhibiting and/or reporting manic behaviors. Type of Screen: If NOT Face to Face, Skip to Disposition Section): Face to Face Referral Source: [facility] Referral Source Contact Number: [phone] Release Signed: No Referral Source Contacted: Yes Release for Community Providers: No Information Provided By:: Patient;Family;Other Health Professional;Other: (EMR review) Are you a Veteran?: No Precipitating Factors: Pt presented to ED with chest pain, shortness of breath and hallucinations. Reports she has not been sleeping x last couple of days due to bladder infection. Husband and pt report this is new onset of hallucinations. Husband and pt report, she is better now, per pt. Denies past or current hx of SI/HI. Denies current hallucinations Date of last yearly physical:: 2025 Outside help or community services at home: None Is there anyone that you know, or are related to, on the Behavioral Health unit?: No  General Information  Type of Screen: If NOT Face to Face, Skip to Disposition Section): Face to Face Referral Source: [facility] Referral Source Contact Number: [phone] Release Signed: No Referral Source Contacted: Yes Release for Community Providers: No Information Provided By:: Patient;Family;Other Health Professional;Other: (EMR review) Are you a  Veteran?: No Precipitating Factors: Pt presented to ED with chest pain, shortness of breath and hallucinations. Reports she has not been sleeping x last couple of days due to bladder infection. Husband and pt report this is new onset of hallucinations. Husband and pt report, she is better now, per pt. Denies past or current hx of SI/HI. Denies current hallucinations Date of last yearly physical:: 2025 Outside help or community services at home: None Is there anyone that you know, or are related to, on the Behavioral Health unit?: No  Potential Risk to Self  Suicidal threats/behaviors in past 6 months?: No Suicidal Ideation or Suicide Threats: No Recent attempt to Harm Self?: No Intent for above: No Currently engaging in self-injurious behavior?: No History of Suicidal/Self-Injuring behaviors?: No History of Suicidal/Self Injurious Behavior Last 6 months?: No History of Suicidal/Self-Injuring behaviors  Greater than the past 6 months?: No Access to firearms?: Yes (secured) Other means of Harm?: Yes (various)  Potential Risk to Others  Homicidal threats/behaviors in past 6 months?: No Homicidal Ideation or Homicidal Threats?: No Named Individual: No Recent attempt to Harm Another?: No Intent for above: No Patient currently assaultive or combative?: No History of Homicidal Acts/Assaultive behaviors?: No History of Homicidal Acts/Assaultive behaviors within past 6 months?: No History of Homicidal Acts/Assaultive behaviors   Greater than the past 6 months?: No Access to firearms?: Yes (secured) Other means of Harm?: Yes (various)  Symptoms  Sleep pattern changed: Yes When did the Sleep Pattern Change?: decreased x last couple of days; had bladder infection; trying to pee all the time Sleep Hours per Night: no sleep since Wednesday night Sleeping increased: No Sleeping decreased: Yes Sleep Decrease Details: frequent awakening;difficulty falling asleep;early morning  awakening Problems: Yes Other Sleep Problem Detail: bladder infection Use sleep aid: Yes Type of Sleep Aid: Lunesta, PRN and an additional medication; see medication list  Have you lost weight recently without trying?: Yes (last two days, sporatic, not eating)  How much weight have you lost?: Unsure Have you been eating poorly because of a decreased appetite?: Yes Appetite Problems:: Yes Weight problems: No Behaviors indicative of an eating disorder: No  Hopelessness/Helplessness: Yes Crying spells/mood swings: Yes (both in last 48 hours per pt and SO) Low energy/fatigue: Yes (yes) Concentration problems: Yes Psychomotor retardation/agitation: No Feelings of guilt/worthlessness: No Social withdrawal: No Recurrent thoughts of death: No Deterioration in Activities of Daily Living: Yes (x last couple of days)  Rapid pressured speech: No Increase in impulsivity: No Increase in energy: No Flight of ideas/loose association: No  Excessive worry: Yes (about upcoming surgery) Nervousness: No Irritability: No Shortness of breath: Yes Racing heart rate: No Sweaty/Chills/Hot flashes: No Nausea/Vomiting/Diarrhea: No Chest Pain: Yes (PTA)  Additional Symptom Information: n/a  Psychosis  Delusions: No Delusions Hallucinations: Visual (Comment);Auditory (Comment) Ambivalence: No (Comment) Confusion: Yes Disorganization: No (Comment)  Treatments  Treatments?: Yes Treatment Date:  (2017) Treatment Provider/Location: [facility] Treatment Type: Behavioral Health;Outpatient Treatment Date of Next Appt or Last Appt: a couple of sessions Additional Treatment?: No Did you follow up with your aftercare appointment?: N/A Did you take your medication as prescribed?: Yes  Substance Use  Substance use in past 12 months?: Yes Drug Screen: Positive (benzos) History of Substance Use/Abuse:: Yes Marijuana Use?: No Cocaine/Crack Use?: No Opiates/Analgesics Use?: No Amphetamine Use?:  No Sedative/Hypnotic Use?: Patient Reports Sedative/Hypnotics - Amount/Frequency: 5mg  valium , PRN Sedatives/Hypnotics - Route of use: Oral Sedative/Hypnotics -  Age of First Use:  (unknown, per pt) Sedative/Hypnotics -  Duration of Current Use: as prescribed Sedative/Hypnotics -  Last Used: PTA Sedative/Hypnotics -  Longest Period Not Used: months Sedative/Hypnotic - How is it being acquired?: Prescription Club Drugs (Ecstasy) Use?: No Hallucinogen Use?: No Inhalant Use?: No Tobacco/Nicotine Use?: No Other Drug Use?: No Withdrawal Potential: Denies Any Problems Related to Substance Use/Abuse: Denies Any Other non-substance addictive behaviors:: n/a  Opiod Use Disorder         Clinical Opiate Withdrawal Scale Heart Rate: 96     Alcohol Use  Alcohol abuse in past 12 months?: No History of Alcohol Use/Abuse:: Patient Denies any history or Current Use #1. How often do you have a drink containing alcohol?: Never #9  Have you or someone else been injured as a result of your drinking?: No #10  Has a relative, friend, doctor or health worker been concerned about your drinking or suggested you cut down?: No       Functioning  Dressing: Independent Bathing: Independent Toileting: Needs assistance Feeding: Independent Hearing - Right Ear: Difficulty with noise;Hearing aid Hearing - Left Ear: Difficulty with noise;Hearing aid Vision - Right Eye: Glasses - to see close up;Glasses - to see distances Vision - Left  Eye: Glasses - to see close up;Glasses - to see distances Walks in Home: Independent Patient Fall Risk Level: Low/medium Possible barriers to participate in Treatment/Programming?: No Current living arrangements (who lives with): husband, 76 y/o mother Able to return to Current Living Arrangements?: Yes Support System:: husband, 49 y/o mother; family, adult children Healthy coping skills: Talk to people;Other: (gospel hymms) Recreational/Leisure activities:  reading, park with grandbaby 58 month old Religious/Spiritual orientation: Baptist Cultural Preferences: none reported  Strengths/Limitations  Strength 1: serving others, per pt. 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, RN for 30 years) Problems at work?: No History of  Abuse?: No (denies) Trauma: none reported Neglect: none reported Exploitation: none reported Bereavement: father in Arts Administrator Issues  Legal: No Engineer, Drilling?: No  Child/Adolescent Assessment  Child / Adolescent?: No  Collateral Contacts  Collateral Contact Info: Able to Interview Collateral Contact Name of Collateral Contact Interviewed: husband Relationship to Patient: Significant other Collateral Contact Response: Supportive and willing to be involved in treatment Collateral Contact Perception of Pt's illness: New issue for patient Collateral contact describes baseline functioning for patient as:: no AH/VH Collateral contact most concerned about:: not sleeping Assured status of living arrangements after discharge to be:: Return to same living arrangement Was primary reason for hospitalization due to conflict in the home/living environment?: No Will the Patient return to the same environment?: Yes  Mental Status  General Appearance: older than stated age Motor Activity: decreased Speech: minimal Exhibited Behavior: cooperative Affect Range /Display: normal range Mood Range /Display: Normal range Affect/Mood Display: Appropriate Mood: ashamed/humiliated Thought Process: wdl (denies current VH/AH) Thought Content: WDL Insight: poor Orientation To:: Person (Yes);Place (Yes);Situation (Yes);Date (Yes)   BP 129/79   Pulse 96   Temp 98.1 F (36.7 C)   Resp 14   Ht 5' 5 (1.651 m)   Wt 175 lb (79.4 kg)   SpO2 99%    BMI 29.12 kg/m     Electronically signed: [employer], LCMHC-S 01/07/2024 / 3:07 AM

## 2024-01-07 NOTE — Progress Notes (Signed)
 [facility]       BH assessment completed by Garen Bud, LCMHCS, with recommendation for Discharge with Outpatient Resources. The AVS has been completed with the safety plan and outpatient resources.  [facility] Safety planning intervention was Completed during the visit. Total time spent on safety planning was 11 minutes.    Electronically signed: Garen Bud, LCMHC-S 01/07/2024 / 3:08 AM

## 2024-01-07 NOTE — ED Notes (Signed)
 Patient ambulating to bathroom

## 2024-01-08 NOTE — Progress Notes (Signed)
 Sara Briggs is a 58 y.o. female seen today for follow up. Chief Complaint  Patient presents with  . Urinary retention    Follow up on catheter removal    F/u d/t recent acute onset urinary retention.  >1025ml at ED. Needs voiding trial.  Evisit with PCP  9/12 for pain while passing urine and difficulty passing urine-sent in empiric Macrobid (no testing)however pt had worsening symptoms. Seen in ED 01/06/24 with reassuring UA and that did not reflex to culture.Blood cultures negative. Her symptoms at that time was chest pain, SOB, hallucinations.Recent insomnia. Was taking Lunesta and Valium , concern for polypharmacy psychotic episode, schizophrenia. Remaining workup was negative.   Last seen by Dr Sherida 12/14/23 s/p  POSTERIOR REPAIR, ANTERIOR REPAIR, CYSTO on 11/01/23. Had initial trouble emptying bladder after surgery, however passed voiding trial 11/13/23 and continued to empty well at last visit pVR 44ml. Excellent support and normal exam .Treated for UTI. Ref to pelvic PT was placed. Increased PT tone and dyspareunia. 01/25/24 appt pending  History of Present Illness The patient presents for evaluation of urinary retention and yeast infection.  She underwent both anterior and posterior surgery, after which she experienced fluid retention intermittantly. Most recently  necessitating an ER visit 8 days . The volume of retained fluid was approximately102ml. Since the surgery, she has been experiencing episodic difficulty initiating urination and has not returned to her normal voiding pattern. During a 6-week follow-up visit, she was diagnosed with a mild UTI and prescribed medication. This was about 3 to 4 weeks ago. Recently, while on vacation, she suspected another UTI due to lack of sleep and being away from home. She took Lunesta, which she believes may have contributed to her inability to urinate. She was initially prescribed Macrobid by Dr. Joen, but this was later switched to cefdinir  when she visited the hospital. She started this treatment on Saturday.Continues antibiotic currently however her sample did not reflex to culture. She did not experience these issues prior to her surgery. She is interested  in self-catheterization at home to avoid future ER visits. She has never performed self-catheterization before but has assisted others with it. She does not currently have an appointment with Dr. as she has been cleared from all previous conditions. She has not yet started pelvic therapy. She has a physical therapy appointment scheduled for 01/25/2024.  She now suspects a yeast infection due to the antibiotics, as she is experiencing itching and burning during urination. She has been using a cream for hemorrhoids to alleviate the itching. She has Diflucan at home but is hesitant to use it due to her current medication regimen.  She also reports feeling better overall, with no chest pain or headaches, which she attributes to the Lunesta.  PAST SURGICAL HISTORY: Anterior and posterior surgery    Hearing impaired.   PVR today: 25ml  Urinalysis: reviewed  has Benign paroxysmal positional vertigo of left ear; Meniere's disease (cochlear hydrops), right; Migraine without aura and without status migrainosus, not intractable; Sensorineural hearing loss (SNHL) of both ears; Type 2 diabetes mellitus without complication, without long-term current use of insulin (*); History of episiotomy; Dyspareunia in female; and Rectocele on their problem list.    Reviewed and updated this visit by provider: Tobacco  Allergies  Meds  Problems  Med Hx  Surg Hx  Fam Hx          Labs:  Office Visit on 01/10/2024  Component Date Value Ref Range Status  . Color 01/10/2024  Yellow  Yellow, Straw, Dark yellow, Colorless, Bright yellow, Xantho, Comment, Pale Yellow Final  . Clarity 01/10/2024 Clear  Clear, Slightly hazy Final  . Glucose,Urine 01/10/2024 500. (A)  Negative mg/dL Final  .  Bilirubin 90/82/7974 Negative  Negative Final  . Ketones,Urine 01/10/2024 Negative  Negative mg/dL Final  . Specific Gravity 01/10/2024 1.010  1.005 - 1.030 Final  . Blood, Urine 01/10/2024 Trace-intact (A)  Negative Final  . pH 01/10/2024 6.5  5.0 - 9.0 Final  . Protein 01/10/2024 Negative  Negative mg/dL Final  . Urobilinogen 90/82/7974 0.2 (A)  0.2, 1.0 EU/dL Final  . Nitrite 90/82/7974 Negative  Negative Final  . Leukocyte Esterase 01/10/2024 Trace (A)  Negative Final      Review of Systems  Genitourinary:  Positive for difficulty urinating. Negative for dysuria and urgency.    Physical Exam Constitutional:      General: She is not in acute distress. Neurological:     Mental Status: Mental status is at baseline.      Encounter Diagnoses  Name Primary?  . Urinary retention Yes  . Vaginal yeast infection      Plan: Assessment & Plan 1. Urinary retention: - Urine sample today showed no signs of infection, although there was a slight presence of white blood cells, which is not uncommon given the collection method. - She has been instructed to measure her urine output if she needs to use the catheter at home. A nurse will demonstrate the catheterization process for her convenience. - A follow-up appointment with Dr. Bastawros has been scheduled in approximately 6 weeks, after she has had the opportunity to work with physical therapy. Her latest retention was during stressful timeframe and differentials include medication side effect  2. Yeast infection: - She reports itching and burning, likely due to a yeast infection from recent antibiotic use. - A prescription for Terazol cream has been provided, with instructions to apply one applicator each night for a duration of 7 nights.  3. Medication management: - She is currently on cefdinir for a suspected UTI but has been advised to discontinue it as her urine sample showed no signs of infection.  Follow-up: A follow-up  appointment with Dr. has been scheduled in approximately 6 weeks.   Computer technology was used to create visit note. Consent from patient/care giver was obtained prior to its use.

## 2024-02-19 NOTE — Progress Notes (Signed)
 Subjective   Patient ID:  Sara Briggs is a 58 y.o. (DOB [birth year]) female.   Chief Complaint  Patient presents with  . Follow-up    6 months  . Diabetes     HPI  Pt presents for follow-up on DM type 2. Glucoses have been around 120-130 range.  She denies polydipsia, polyuria and polyphagia. She is in pelvic floor PT due to recent surgery. Also denies chest pain, SOB, HA's and dizziness. She is feeling well today. Will be having cochlear implant surgery next week. In counseling now due to the episode that happened causing her to go to the ER several months ago. Things are improving with her sleep and emotions.  Feeling well.  Problem List, Past Medical History, Past Surgical History, Past Family History, Social History, Medications, and Allergies, were reviewed and updated as appropriate.   Review of Systems Review of Systems  Constitutional:  Negative for activity change, chills, fatigue and fever.  Respiratory:  Negative for cough, chest tightness and shortness of breath.   Cardiovascular:  Negative for chest pain, palpitations and leg swelling.  Gastrointestinal:  Negative for abdominal pain, diarrhea, nausea and vomiting.  Endocrine: Negative for polydipsia, polyphagia and polyuria.  Skin:  Negative for rash and wound.  Neurological:  Negative for dizziness, syncope, weakness, light-headedness, numbness and headaches.     Objective   BP 120/70 (BP Location: Left Upper Arm, Patient Position: Sitting)   Pulse 72   Temp 98.2 F (36.8 C) (Oral)   Resp 18   Ht 5' 5 (1.651 m)   Wt 172 lb 9.6 oz (78.3 kg)   SpO2 95%   BMI 28.72 kg/m   Physical Exam Vitals and nursing note reviewed.  Constitutional:      General: She is not in acute distress.    Appearance: Normal appearance. She is well-developed. She is not ill-appearing or toxic-appearing.  HENT:     Head: Normocephalic and atraumatic.  Neck:     Vascular: Normal carotid pulses. No carotid bruit or  JVD.  Cardiovascular:     Rate and Rhythm: Normal rate and regular rhythm.     Heart sounds: Normal heart sounds, S1 normal and S2 normal. No murmur heard.    No friction rub. No gallop.  Pulmonary:     Effort: Pulmonary effort is normal.     Breath sounds: Normal breath sounds. No wheezing or rales.  Skin:    General: Skin is warm and dry.  Neurological:     General: No focal deficit present.     Mental Status: She is alert and oriented to person, place, and time.  Psychiatric:        Mood and Affect: Mood normal.        Behavior: Behavior normal.        Thought Content: Thought content normal.        Judgment: Judgment normal.    --------------------------- Labs last 12 hours No results found for this or any previous visit (from the past 12 hours).    Impression   1. Type 2 diabetes mellitus without complication, without long-term current use of insulin (*)  Hemoglobin A1c   Comprehensive Metabolic Panel   Comprehensive Metabolic Panel   Hemoglobin A1c    2. Screening for lipid disorders  Lipid Panel With LDL/HDL Ratio   Lipid Panel With LDL/HDL Ratio       Plan  Reassurance. Last A1C was 7.2. Will recheck today along with CMP and lipids. Continue tradjenta   5mg  daily - will add additional meds if needed. Encourage diet low in sweets and starches and increased exercise. Increase fluids.  See encounter after visit summary for patient specific instructions.  I have reviewed the information contained in this note and personally verified its accuracy.  I obtained the history of present illness and personally performed the physical exam.  Patient verbalized to me that they understood what their problem is, what they need to do about it and why it is important that they do it.   Follow up in about 6 months (around 08/20/2024) for - AWV/CPE - fasting..    Orders Placed This Encounter  Procedures  . Hemoglobin A1c  . Comprehensive Metabolic Panel  . Lipid Panel With  LDL/HDL Ratio     Patient's Medications       * Accurate as of February 20, 2024 10:12 AM. Reflects encounter med changes as of last refresh          Continued Medications      Instructions  ACCU-CHEK GUIDE test strip Generic drug: glucose blood  Use to monitor blood glucose 2 time(s) daily   ACCU-CHEK GUIDE w/Device Kit  Use to check blood sugars up to twice daily.   ACCU-CHEK SOFTCLIX LANCETS lancets  Use to monitor blood glucose 2 time(s) daily.   AMBULATORY COMPOUNDED MEDICATION  Vaginal valium  5mg  compounded suppository Administer vaginally three times a day as needed for pelvic pain   Betahistine HCl Powd  24 mg, 2 times a day   cetirizine 10 mg tablet Commonly known as: ZYRTEC  10 mg, As needed   * clobetasol propionate 0.025 % Crea cream Commonly known as: IMPOYZ  As needed   * clobetasol 0.05 % ointment Commonly known as: TEMOVATE  Topical, As needed   Cranberry 200 MG Caps  1 capsule, 2 times a day   cyclobenzaprine 10 mg tablet Commonly known as: FLEXERIL  10 mg, With meals as needed   D-Mannose 500 MG Caps  1 capsule, Daily   * diazepam  5 mg tablet Commonly known as: VALIUM   10 mg, Every 8 hours as needed   * diazepam  5 mg tablet Commonly known as: VALIUM   5 mg, Oral, Daily as needed   diphenhydrAMINE  25 mg tablet Commonly known as: BANOPHEN   12.5 mg, At bedtime as needed   estradiol  0.1 mg/gram vaginal cream Commonly known as: ESTRACE  Start taking on: Sep 07, 2023  Place one half g vaginally daily for 14 days, THEN one half g 3 (three) times a week.   fluticasone propionate 50 mcg/actuation nasal spray Commonly known as: FLONASE  2 sprays   Glucosamine Sulfate 750 MG Tabs  2 tablets, Daily   hydrocortisone 2.5 % cream  1 Application   hydrocortisone 2.5% rectal cream Commonly known as: ANUSOL-HC,PROCTOSOL,PROCTOZONE,PROCTO-MED  Rectal, 2 times a day as needed   loratadine 10 MG tablet Commonly known as: CLARITIN  10  mg, Daily   magnesium  oxide 400 Tabs Commonly known as: MAG-OX  500 mg, Daily   Melatonin 10 MG Caps  10 mg, At bedtime   metaxalone 400 MG tablet Commonly known as: SKELAXIN  As needed   SAMBUCUS ELDERBERRY 50 MG/5ML Syrp Generic drug: Black Elderberry  As needed   terconazole 0.4% vaginal cream Commonly known as: TERAZOL 7  1 applicator   TRADJENTA  5 MG tablet Generic drug: linaGLIPtin   5 mg, Daily   traMADol  50 mg tablet Commonly known as: ULTRAM   50 mg, As needed   triamterene -hydrochlorothiazide  37.5-25  mg per capsule Commonly known as: DYAZIDE   1 capsule, Oral, Daily      * * This list has 4 medication(s) that are the same as other medications prescribed for you. Read the directions carefully, and ask your doctor or other care provider to review them with you.          Discontinued Medications    eszopiclone 3 mg tablet Commonly known as: LUNESTA Stopped by: Tylene Meres   ibuprofen  800 mg tablet Commonly known as: ADVIL ,MOTRIN  Stopped by: Tylene Meres   JARDIANCE 25 mg Tabs tablet Generic drug: empagliflozin Stopped by: Tylene Meres        Risks, benefits, and alternatives of the medications and treatment plan prescribed today were discussed, and patient expressed understanding. Plan follow-up as discussed or as needed if any worsening symptoms or change in condition.    A yearly health maintenance exam was recommended where appropriate.     *Some images could not be shown.

## 2024-02-21 NOTE — Progress Notes (Signed)
 Sara Briggs is a 58 y.o. female seen today for follow up. Chief Complaint  Patient presents with  . Follow-up    6 week Follow Up-Urinary Retention. Better since PT and able to empty better, still has to wait a little but the stream is better.  No UTI symptoms.     Sara Briggs presents today for follow up. She was previously seen on 01/17/2024. At the time, was concerned regarding urinary retention. Had normal PVRs. She does have a high tone pelvic floor and was recommended to see pelvic PT.  She has been to PT. Records show improvement in relaxation of pelvic floor muscles. Dyspareunia improved per records. She also endorses some improvement. Is using vaginal valium , feels like it is  helping.  She has cochlear implant surgery on 02/27/2024. Is concerned regarding retention. Discussed with surgeon d/c with Foley (his suggestion) but she prefers not to. Surgeon prefers she not CIC if needed due to surgery and bending over. She has some questions regarding voiding postoperatively.   She presents with her husband today.   Void: 50ml PVR: 6ml  Prior HPI (01/17/2024): Sara Briggs presents today for concerns of urinary retention. She has cochlear implant surgery coming up and is nervous due to urinary retention.   Does not feel like empties all the way. Sometimes will have dribbles come out. First episode of retention was when she was at the beach. Had a medication related psychosis event and was discharged with a foley catheter. No UTI on testing.   S/p A/P repair in 11/2023. At postop visit, noted to have high tone pelvic floor. Was referred to PT, first appointment is next week. Has been doing some exercises on her own at home, including kegels.  Voids every 1-2 hours.  Nocturia 1-2x No UTI symptoms today   PVR today: 9ml Void: Urinalysis: negative  Prior HPI (01/10/2024--Sara Chiquita, NP) F/u d/t recent acute onset urinary retention.  >1042ml at ED. Needs voiding trial.   Evisit with PCP  9/12 for pain while passing urine and difficulty passing urine-sent in empiric Macrobid (no testing)however pt had worsening symptoms. Seen in ED 01/06/24 with reassuring UA and that did not reflex to culture.Blood cultures negative. Her symptoms at that time was chest pain, SOB, hallucinations.Recent insomnia. Was taking Lunesta and Valium , concern for polypharmacy psychotic episode, schizophrenia. Remaining workup was negative.   Last seen by Dr Sherida 12/14/23 s/p  POSTERIOR REPAIR, ANTERIOR REPAIR, CYSTO on 11/01/23. Had initial trouble emptying bladder after surgery, however passed voiding trial 11/13/23 and continued to empty well at last visit pVR 44ml. Excellent support and normal exam .Treated for UTI. Ref to pelvic PT was placed. Increased PT tone and dyspareunia. 01/25/24 appt pending  History of Present Illness The patient presents for evaluation of urinary retention and yeast infection.  She underwent both anterior and posterior surgery, after which she experienced fluid retention intermittantly. Most recently  necessitating an ER visit 8 days . The volume of retained fluid was approximately1025ml. Since the surgery, she has been experiencing episodic difficulty initiating urination and has not returned to her normal voiding pattern. During a 6-week follow-up visit, she was diagnosed with a mild UTI and prescribed medication. This was about 3 to 4 weeks ago. Recently, while on vacation, she suspected another UTI due to lack of sleep and being away from home. She took Lunesta, which she believes may have contributed to her inability to urinate. She was initially prescribed Macrobid by Dr. Joen, but this  was later switched to cefdinir when she visited the hospital. She started this treatment on Saturday.Continues antibiotic currently however her sample did not reflex to culture. She did not experience these issues prior to her surgery. She is interested  in self-catheterization  at home to avoid future ER visits. She has never performed self-catheterization before but has assisted others with it. She does not currently have an appointment with Dr. as she has been cleared from all previous conditions. She has not yet started pelvic therapy. She has a physical therapy appointment scheduled for 01/25/2024.  She now suspects a yeast infection due to the antibiotics, as she is experiencing itching and burning during urination. She has been using a cream for hemorrhoids to alleviate the itching. She has Diflucan at home but is hesitant to use it due to her current medication regimen.  She also reports feeling better overall, with no chest pain or headaches, which she attributes to the Lunesta.  PAST SURGICAL HISTORY: Anterior and posterior surgery      Reviewed and updated this visit by provider: Tobacco  Allergies  Meds  Problems  Med Hx  Surg Hx  Fam Hx           Review of Systems  Genitourinary:  Negative for difficulty urinating, dysuria and urgency.    Physical Exam Constitutional:      General: She is not in acute distress. Neurological:     Mental Status: Mental status is at baseline.      Encounter Diagnosis  Name Primary?  . High-tone pelvic floor dysfunction Yes    Plan:  Reassurance provided Reviewed pelvic relaxation techniques May continue vaginal valium  as needed Follow up PRN

## 2024-02-26 NOTE — Anesthesia Preprocedure Evaluation (Signed)
 Patient: Sara Briggs  Procedure Information     Date/Time: 02/27/24 0700   Procedure: Left robotic assisted [employer] [phone] cochlear implant (Left: Ear) - beyond hearing aids   Location: [facility]   Surgeons: Camellia Layman Milliner, MD       Relevant Problems  CARDIOVASCULAR  (+) Migraine without aura and without status migrainosus, not intractable    NEUROLOGY  (+) Migraine without aura and without status migrainosus, not intractable    There were no vitals taken for this visit.   Clinical information reviewed:  Meds      Anesthesia Evaluation  Anesthesia History: Patient has no history of anesthetic complications.  PONV Predictive Score (Scale 0-5):  Apfel risk score: 0 Respiratory: negative pulmonary ROS.  Cardiovascular: negative cardio ROS.  HEENT: Patient has deafness (bilateral SNHL, Meniere's).  Neurological: Patient has headaches.  Renal/Gastrointestinal: negative GI/hepatic ROS.  Genitourinary: negative renal ROS.  Endocrine: Patient has type 2 diabetes.     Physical Exam  Airway  Mallampati: III TM Distance (FB): 3 Oral Aperture (FB): 3 Cardiovascular - normal exam Dental - normal exam Pulmonary - normal exam HEENT  Ears: deaf (bilateral SNHL, Meniere's)   Anesthesia Plan  Review Preop documentation reviewed: H&P reviewed, NPO Status Reviewed, Preop Vitals Reviewed, Previous Anesthesia Records Reviewed and Periop Tests and Results Reviewed  Plan ASA score: 2  Anesthesia type: general Induction: intravenous Post-op plan: Extubation in OR and PACU  Informed Consent Anesthetic plan and risks discussed with patient. Plan discussed with attending.

## 2024-02-27 NOTE — Anesthesia Procedure Notes (Signed)
  Airway Date/Time: 02/27/2024 8:48 AM Reason: elective  Airway not difficult  General Information and Staff Patient location during procedure: OR Performed: CRNA  Anesthesiologist: Elveria Almarie Riley, MD CRNA: Channing Sedalia Pae, CRNA  Indications and Patient Condition Indications for airway management: anesthesia Sedation level: deep    Preoxygenated: yesPatient position: sniffing  Mask difficulty assessment: 1 - vent by mask  Final Airway Details  Final airway type: endotracheal airway Successful airway: ETT Cuffed: yes  Successful intubation technique: direct laryngoscopy Adjuncts used in placement: none Endotracheal tube insertion site: oral Blade: Macintosh Blade size: #3 Tube size (mm): 6.5 Cormack-Lehane Classification: grade I - full view of glottis Placement verified by: chest auscultation, capnometry and palpation of cuff  Cuff volume (mL): 7 Measured from: lips ETT to lips (cm): 23 Number of attempts at approach: 1 Number of other approaches attempted: 0 Final Dental Condition: Teeth, lips, and tongue in pre-anesthetic condition Patient tolerated procedure well with no complications

## 2024-02-27 NOTE — H&P (Signed)
 02/27/2024    Location: [facility] at [place]    Precautions: Gloves were worn during the patient's evaluation. SARS-CoV-2                         precautions were followed.   Provider: Camellia EMERSON Milliner, M.D., M.S., F.A.C.S.  Visit Type:  Preoperative Adult  Encounter Date: Aug 31, 2023  Patient Name:  Sara Briggs     Medical Record Number (MRN): [id number]                          Sex:  female  Date of Birth:  1965/06/21                                Age: 58 y.o.  Phone number(s): [phone] (home)                       [phone] (mobile)  Primary Dr:  Hunter Georgina Corrente, AGNP  Referring Provider: No ref. provider found  Person(s) accompanying patient: husband  Interpreter: None      CC: Progressive loss of hearing in both ears, R cochlear & vestibular Meniere's, vestibular migraine, would now like to proceed with a left cochlear implant         Chief Complaint  Patient presents with  . Follow-up      3 month - CT Temp Bones (03-30-23) - HT (03-16-23)        Problem List     Patient Active Problem List  Diagnosis  . Vertigo  . Sensorineural hearing loss (SNHL), bilateral  . Migraine without aura and without status migrainosus, not intractable  . Tinnitus aurium, bilateral  . Benign paroxysmal positional vertigo of left ear  . Meniere's disease, cochleovestibular, active, right  . Tinnitus of both ears             11-21-19 HPI:    Sara Briggs is a 58 y.o. female who is here today with the chief complaint of episodic vertigo and bilateral asymmetric sensorineural hearing losses    This is her first visit to see me at the Otolaryngology Clinic in [place].    The patient was evaluated by Dr. Norleen Notice at [facility] in [place], [place]  on May 24, 2017 for possible BPPV.  She had a 2 to 49-month history of intermittent vertigo and a 6 to 7-year history of bilateral hearing loss.  She also  complained of  progressively tinnitus that was becoming worse with time.  There is a history of hearing loss in her mother and several members of her family.  She was checked for BPPV and was negative.  An MRI brain scan was declined. She was recommended  for vestibular rehabilitation.    On 12-20-18, the patient was evaluated again at [facility] by Velia Pry, P.A:      She had resolution of symptoms for several months. About two months ago it returned. She describes having a mild baseline dizziness that is tolerable; she takes it slow to turn around or bend over. About twice per month she has a severe episode described  as spinning that comes on without antecedent event; she almost always take Meclizine that knocks me out for two days. 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 She cannot say how long  the vertigo lasts as she always takes a vestibular  suppressant that makes her very drowsy; it seems to last more than 24 hours and is not associated with change in head position. She has long-standing sensorineural hearing loss, slightly worse in the right ear. Reports long-standing, bilateral, non-pulsatile  tinnitus that is worse with stress/anxiety. She wears bilateral hearing aids. She cannot say for sure whether her hearing acutely changes or the tinnitus changes when she has a spell of vertigo.    Today, the patient reports that she has developed very loud constant, nonpulsatile, tinnitus in both ears that sound like an alarm sound.  It is a humming sound to a metal sound.  The tinnitus has been fluctuating in loudness.  She has to listen to music  to try to keep the tinnitus from overwhelming her.  She notices an increase in a tinnitus with stress.  The tinnitus does keep her from falling asleep and sometimes awakens her from sleep.  She reports experiencing spells of vertigo associated with nausea  but no vomiting.  Her first spell was 1-1/2 to 2 months ago while she was at church.  She  has taken Antivert without much success.  The spells last 24 to 48 hours.  She does have an occasional headache with the vertigo and has photophobia.  She does not  report a visual aura but has some trouble focusing.  Her daughter has migraine disease.  Her mother side of the family has hearing loss and speech issues.  The patient denies any previous ear operations or childhood ear infections.  She is having increased  difficulty hearing people with masks.      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        01-02-20 HPI:    Today, the patient reports that she has had 3 spells of vertigo since last being seen.  One spell was following leaving her last clinic visit with me.  The second spell occurred later, and the third spell occurred after she underwent vestibular testing.   She is very bothered by strobe lights.  She denies otorrhea or otalgia.  She is found that diazepam  does work for her when she has the vertigo.      08-05-2021 HPI:    The patient was worked in today as a special patient due to a recent audiogram at her hearing aid dealer and fitters that showed a marked change in her hearing.       Today, the patient reports that she thinks that her hearing has decreased progressively over the past several years.  She denied otorrhea or otalgia.  She still has vestibular migraines with spells of vertigo and reports bilateral tinnitus.  The tinnitus  changes sometimes to sound like an alarm sound in her head.    She underwent an audiogram on 08/04/2021 where she obtained her hearing aids and they found a marked change in her hearing.  However they felt that she had SRT's of 95 DB with 65% word recognition in the right ear and 15% word recognition in the left ear.   They did not perform masking as per the  audiogram.    Patient is currently struggling to hear.  This was corroborated by her husband.      When she  wears her hearing aids, they produce migraine symptoms.  She prefers to not wear her hearing aids so that she does not experience migraine disease.  She feels that the hearing aids produce a suction like feeling in both ears.  She does not have  a suction like feeling when she does not wear the hearing aids.       06/01/2023 HPI:   Today, the patient reports that she has been experience some flareup of her Mnire's disease.  She feels as if she is on a boat rocking back and forth.  Because of her progressive hearing loss in both ears and the Mnire's disease she is now on disability.  This started November 2024.  She is also taking care of an 67 year old mother.   She reports tinnitus louder in her right ear than her left ear tinnitus is constant and changes with the weather.  She was started on beta histamine by Joesph and feels the medication has definitely helped her.  She has been able to wear hearing aids as they cause a lot of pressure sensation for her and she cannot wear her right hearing aid.  She is struggling to hear in most situations and is speech reading.   She was here today to discuss details of a cochlear implant.  She had many questions.  I answered all of her questions.  She denied any recent otorrhea or otalgia.  Many of her symptoms are consistent with vestibular migraine.         08/31/2023 HPI:   Today, the patient reports that she continues to struggle to hear even with her hearing aids.  She is using a voice to text app.  She is finding it increasingly difficult to converse with her husband and her elderly mother.  Her insurance provider has agreed to cover a cochlear implant for her.  The procedure has been approved through the end of July 2025.   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  She is scheduled to see her dentist on  09/14/2023 and most likely undergo extractions in early June 2025.   She also has developed some pelvic issues following delivery of her children in May be recommended for a pelvic reconstruction on 09/07/2023.  She is unsure about this procedure.   She would like to proceed now with a left cochlear implant and consider wearing a resound hearing aid in the right ear.   She understands that PVC-21 is recommended 2 weeks prior to having a cochlear implant.  She refuses the vaccination and reports excepting the risk of possible postoperative meningitis.    02/27/2024 HPI:  The patient's interval history and physical examination have not changed from my initial evaluation. The patient or parents deny(ies) any interval change in the patient's health.   The patient has been evaluated at the bedside in the preop area, [address]. The surgical history, physical examination, and laboratory/diagnostic studies have been reviewed and remain accurate without interval change.  The patient was re-examined, and the patient's physiologic condition has not changed significantly in the last 30 days. The condition still exists that makes the recommended procedure necessary. The treatment plan remains the same, without new options for care.  No new pharmacological allergies or types of therapy have been initiated that would change the plan or the appropriateness of the plan.  The patient and/or family understand the potential benefits and risks.  The patient and his/her family understand the potential benefits  and risks associated with the procedure(s) and has/have agreed to undergoing a left robotic assisted [employer] [phone] cochlear implant.  The patient understands that there are no guarantees that the recommended procedure(s) will be able to provide a hearing sensation in her left ear.    Risks, complications, and alternatives of the procedure(s) have been discussed. Questions have been invited and answered.  Informed consent has been signed and witnessed. Preoperative counseling has been provided. I have spoken to the patient's husband, Redell, who was at the bedside.  The patient denies any cough, fever, or other respiratory symptoms during the last 3 days.  It was discussed with the patient/family that these facilities include a teaching institution and that residents and fellows who are physicians in graduate medical training, non-physician practitioners including physician assistants and nurse practitioners, and students in training to be physicians may assist in their care, which includes performing portions of the operation under my supervision.  I will be present for all critical portions of the operation.      Allergies       Allergies  Allergen Reactions  . Apple Rash and Swelling      Mouth and tongue swelling  . Adhesive Itching      takes off skin  . Metformin GI Intolerance  . Saxagliptin Other (See Comments)  . Hydrocodone -Acetaminophen  Rash  . Oxycodone -Acetaminophen  Rash  . Sulfamethoxazole Rash          Medications Ordered Prior to Encounter        Current Outpatient Medications on File Prior to Visit  Medication Sig Dispense Refill  . acetaminophen  (TYLENOL ) 500 mg tablet Take 500 mg by mouth every 6 (six) hours as needed.      . black cohosh root (BLACK COHOSH ORAL) Take 540 mg by mouth daily.      . calcium  citrate (CALCITRATE) 950 mg (200 mg calcium ) tab Take 500 mg by mouth 2 (two) times a day.      . cetirizine (ZyrTEC) 10 mg tablet Take 10 mg by mouth as needed.      . clobetasoL (TEMOVATE) 0.05 % ointment Apply 1 Application topically as needed.      . cranberry extract 200 mg cap Take 1 capsule by mouth 2 (two) times a day.      . cyclobenzaprine (FLEXERIL) 10 mg tablet Take 10 mg by mouth 3 (three) times a day as needed.      SABRA d-mannose 500 mg cap Take 1 capsule by mouth daily.      . diazePAM  (VALIUM ) 5 mg tablet Take 5 mg by mouth every 6 (six) hours  as needed for up to 10 doses. 10 tablet 0  . elderberry fruit 50 mg/5 mL syrp Take by mouth as needed.      . empagliflozin (Jardiance) 10 mg tab Take 10 mg by mouth daily.      . eszopiclone (LUNESTA) 3 mg tablet Take 1 tablet by mouth nightly as needed.      . fluticasone propionate (FLONASE) 50 mcg/spray nasal spray Administer 2 sprays into each nostril as needed.      SABRA glucosamine sulfate 750 mg tab Take 2 tablets by mouth daily.      SABRA glucose blood (Accu-Chek Guide test strips) test strip USE TO CHECK BLOOD SUGAR      . hydrocortisone 2.5 % cream 1 Application 2 (two) times a day.      . linaGLIPtin  (Tradjenta ) 5 mg tab Take 5 mg by mouth  in the morning.      . MAGNESIUM  CITRATE ORAL Take 1 tablet by mouth daily.      . magnesium  oxide 400 mg (241 mg magnesium ) tab Take 500 mg by mouth daily. 1 daily alternating with 2 every other day      . miscellaneous medical supply misc Betahistine 16mg  PO TID take with meals.   Patient instructions: Visit [email or web address], create an account/profile, upload signed Rx. Estimate cost: $65-90 for 86mo. Please notify/call our office well in advance (2+ months before) future refills are needed to allow for processing/delivery time. (Patient taking differently: Take 24 mg by mouth 2 (two) times a day. Betahistine 16mg  PO TID take with meals.   Patient instructions: Visit [email or web address], create an account/profile, upload signed Rx. Estimate cost: $65-90 for 86mo. Please notify/call our office well in advance (2+ months before) future refills are needed to allow for processing/delivery time.) 300 each 4  . multivitamine, geriatric, (Multivitamin 50 Plus) tab Take 1 tablet by mouth daily.      SABRA omega-3s-dha-epa-fish oil (Fish OiL) 350-600 mg cap Take 2 capsules by mouth daily.      . ondansetron  (ZOFRAN -ODT) 8 mg disintegrating tablet Dissolve 8 mg on tongue every 8 (eight) hours as needed.      . sour cherry extract (TART CHERRY EXTRACT ORAL) Take 1,200 mg by  mouth daily.      . traMADoL  (ULTRAM ) 50 mg tablet Take 50 mg by mouth every 8 (eight) hours as needed.      . triamterene -hydroCHLOROthiazide  (DYAZIDE ) 37.5-25 mg per capsule TAKE ONE CAPSULE BY MOUTH DAILY 90 capsule 0    No current facility-administered medications on file prior to visit.          Medical History      Past Medical History:  Diagnosis Date  . Abdominal hemorrhage    . Allergy    . Diabetes mellitus    (CMD)    . Hearing loss    . Tachycardia    . Tinnitus, bilateral    . Vertigo            Surgical History       Past Surgical History:  Procedure Laterality Date  . CESAREAN SECTION, UNSPECIFIED        Procedure: CESAREAN SECTION  . COLPORRHAPHY        Procedure: A&P REPAIR WITH CYSTO  . TUBAL LIGATION Bilateral      Procedure: TUBAL LIGATION          Family History        Family History  Problem Relation Name Age of Onset  . Hypertension Mother      . Kidney failure Mother      . Diabetes Mother              Reports that she has never smoked. She has never used smokeless tobacco. She reports that she does not currently use alcohol. She reports that she does not use drugs.  She is not on any weight loss medications and is not injecting any medications.  She is not on any anticoagulants.    ROS: Patient is under stress as her mother has been ill and recently hospitalized.   A complete ROS was performed with pertinent positives/negatives noted in the HPI. The remainder of the ROS are negative.    Prior to this encounter, I have reviewed the patient's current medications and allergies, medical/family/and social histories as well as the patient's vital signs as available.  PHYSICAL EXAMINATION:   Vitals:   02/27/24 0611  BP: 115/81  Pulse: 66  Resp: 16  Temp: 97 F (36.1 C)  SpO2: 98%       General: Well-developed, well-nourished.  No recognizable syndromes or patterns of malformation.  No acute distress.  Awake, alert, coherent,  spontaneous and logical.  Speech: Speech is within normal limits.  Communicates without difficulty.  Head: Normocephalic without lesions or trauma  Face: Facial function is intact and symmetric.  Eyes: PERRLA with EOMI.  Ears: Otomicroscopic examination performed with the use of the microscope  Right Auricle: Normal location, size, shape, and angulation and No skin lesions or signs of inflammation or infection  Left Auricle:       Normal location, size, shape, and angulation and No skin lesions or signs of inflammation or infection  Right Mastoid Cavity:    Left Mastoid Cavity:    R External Auditory Canal: Normal canal width and length., No signs of infection., No stenosis., and No masses.  L External Auditory Canal: Normal canal width and length., No signs of infection., No stenosis., and No masses.  R Tympanic Membrane: The tympanic membrane was clear and mobile., There was an air containing middle ear space., No evidence of infection., and No evidence of cholesteatoma.  L Tympanic Membrane: The tympanic membrane was clear and mobile., There was an air containing middle ear space., No evidence of infection., and No evidence of cholesteatoma.  Tuning Fork Testing:    Nose: Normal external nasal examination.  Oral Cavity:    Nasopharynx:    Larynx:    Neck:    Chest:    Cardiac:    Neurologic: Oriented x3 , cranial nerves II-XII were intact except for VIIIth nerve(s)  Other:           Audiometric Testing:    08-28-19 Audiogram: outside audiogram from [place]    My independent interpretation of the audiogram is that the patient has bilateral moderately severe to severe asymmetric high-frequency sensorineural hearing losses, right ear greater than left ear.  She also has bilateral asymmetric low-frequency sensorineural  hearing losses right ear greater than left ear.  She had an SRT of 45 dB right ear and 30 dB left ear with 72% discrimination right ear and 60%  discrimination left ear.          12-04-19 Audiogram:               08/04/2021 audiogram:    My independent interpretation of the audiogram is that the patient has severe to profound high-frequency sensorineural hearing loss in both ears with SRT's of 95 DB bilaterally, which is unlikely,  and word recognition of 65% right ear and 15% left ear.  The audiogram is not inherently consistent.  It does not appear that masking was used.  I asked our audiologist to repeat the audiogram.          08/05/2021 audiogram by Arnaldo Don, AuD    My independent interpretation of today's audiogram is that the patient has bilateral moderately severe high-frequency sensorineural hearing losses with SRT's of 30 DB right ear and 25 DB left ear  with 28% discrimination right ear and 24% discrimination left ear.  She has had a marked change in her word recognition ability as compared to her 12/04/2019 audiogram.  At this time, she had 64% word recognition in the right ear and 76% word recognition  in the left ear.  03/16/2023 Audiogram          06/01/2023  Audiogram:   My independent interpretation of the audiogram is that the patient has undergone [place] bio sentence testing.  In quiet with both the ears 36%, quiet right ear 35% in quiet left ear 27%.  She did slightly better on CNC words and phonemes in the right ear than the left ear.  See the scores below.            Audiology Consultations     08/05/2021 Audiology Adult evaluation by Arnaldo Don,, AUD   REASON FOR VISIT  Audiologic monitoring and ENT follow-up due to concerns for sudden hearing loss.     Patient has a history of vertigo, bilateral constant tinnitus, and known progressive bilateral sensorineural hearing loss. Vestibular evaluation on 12/04/19 found that patient meets criteria for vestibular migraine.     Patient's last audiogram at [facility] on 12/04/19 found normal hearing through 750 Hz sloping to a severe  sensorineural hearing loss bilaterally. WRS was 64% at 95 dB HL in the right ear  and 76 % at 95 dB HL in the left ear. Patient currently wears Oticon Opn S1 miniRITE hearing aids.     The patient was seen urgently today at the request of Camellia Milliner, MD, in Otolaryngology due to a recent audiogram from her local audiologist that showed a marked change in her hearing. Scanned 08/04/21 audiogram from [facility] and Audiology shows bilateral SRTs of 95 dB HL, and NU-6 list word recognition scores of 65% at 95 dB HL in the right ear and 15% at 95 dB HL in the left ear.    RETURN HISTORY  Provided by: Patient/Medical record.    Today, patient reports a subjective bilateral decrease in hearing. Patient reports she is still experiencing vestibular migraines with spells of vertigo and bilateral tinnitus like an alarm. She feels that when she wears her hearing aids, they produce  migraine symptoms. She has not been wearing them frequently, and reports she is struggling to hear conversation.    AUDIOLOGICAL EVALUATION  Otoscopy  Right Ear: Clear EAC  Left Ear: Clear EAC    Audiometric Testing  Today's results were obtained using conventional audiometry and insert earphones, with good reliability.    Right Ear: Speech reception threshold obtained at 30 dB HL. Threshold testing revealed normal hearing from 250-500 Hz, sloping to a mild to profound sensorineural hearing loss (SNHL).    Left Ear: Speech reception threshold obtained at 25 dB HL. Threshold testing revealed normal hearing from 250-500 Hz, sloping to a mild to profound SNHL.    Today's test results are available on the audiogram in the Media tab.    Word Recognition Testing  Right Ear:  28 % at 95 dB HL with effective contralateral masking.  Left Ear:   24 % at 95 dB HL with effective contralateral masking.    SUMMARY  Patient has a mild to profound SNHL from 750-8k Hz bilaterally. Word recognition ability in quiet is very poor in  both ears at suprathreshold level. Today's results were reviewed with the patient and  her husband. Patient will return to Dr. Milliner to discuss today's results and next steps.    Compared to last [facility] audiogram on 12/04/19, there has been a significant change (>=10+) in thresholds at [phone] Hz in the right ear and from 750-4k Hz in the left ear (see below). WRS has dropped from 64% to 28% at 95 dB HL in the  right ear and  from 76% to 24% at 95 dB HL in the left ear.                 Hz  250  500   750   1000   1500   2000  3000  4000  6000  8000                RIGHT  +5  +5  +15  +15  +15  +5  +5  +5  0  +5                LEFT  0  +5  +25  +30  +35  +20  +15  +10  +  0    *Please note: positive numbers indicate worse hearing thresholds. Greater than or equal to +/- 10 dB considered a significant change.    RECOMMENDATIONS  Ms.   Harkless is scheduled for an Otolaryngology appointment after this visit.  Monitor Ms. Sadowsky's hearing following  medical management.     03/16/2023 Adult Audiology evaluation by Roxann Marrow   REASON FOR VISIT -    Ms. Tanda , 58 y.o. female, was seen today for audiologic monitoring in conjunction with Otolaryngology . She was accompanied to today's appointment by her husband.    Ms. Eugene has a significant history of vertigo, bilateral constant tinnitus, and known progressive bilateral sensorineural hearing loss. Vestibular evaluation on 12/04/19. She was last evaluated by audiology on 08/05/2021 at which time results revealed mild to profound sensorineural hearing loss from 750-8k Hz bilaterally. Word recognition ability in quiet was very poor in both ears at suprathreshold level. She currently wears Oticon Opn S1 miniRITE hearing aids.    RETURN HISTORY Provided by -  Patient   Today, Ms. Cella reports that she feels her hearing has gotten worse in both ears and she experiences significant difficulty understanding even when wearing her hearing aids. She  continues to report constant ringing tinnitus in both ears that fluctuates in volume. She also reports a worsening in her overall balance, as she has been utilizing a cane to ambulate. She has been experiencing more frequent episodes of imbalance and identifies stress, complex visual stimuli, and changes in the barometric pressure as triggers. She denies recent otalgia or ear infections.    AUDIOLOGICAL EVALUATION Otoscopy Clear ear canals bilaterally     Audiometric Testing Reliability: Good Testing was completed under insert headphones   Results are as follows:  Right ear: Speech Reception Threshold (SRT) was obtained at 35 dB HL. Pure tone testing reveals normal hearing sensitivity at 250 Hz sloping to mild to profound sensorineural hearing loss from [phone] Hz  Left ear: Speech Reception Threshold (SRT) was obtained at 45 dB HL. Pure tone testing reveals normal hearing sensitivity at 250 Hz sloping to mild to profound sensorineural hearing loss from [phone] Hz   Compared to most recent hearing test on 08/05/2021, hearing has overall declined in both ears     Word Recognition Testing Right Ear:  32 % at 85 dB HL with effective contralateral masking.  Left Ear:   8 % at 85 dB HL with effective contralateral masking.    SUMMARY Today's results reveal bilateral mild to profound sensorineural hearing loss from [phone] Hz that has worsened in both ears compared to most recent audiologic testing on 08/05/2021. The patient's word recognition abilities in quiet at suprathreshold are very poor in both ears.     RECOMMENDATIONS Ms.  Goodin is scheduled for an Otolaryngology appointment after this visit. Recommend hearing recheck if Ms. Cuffe notices a change in hearing. Consider cochlear implantation given profound hearing loss and very poor word recognition abilities. Her and her husband were counseled on this and are interested in returning for a cochlear implant evaluation  Continue binaural  amplification          06/01/2023 Audiology CI consultation with Elveria Medley, AUD   HISTORY Provided by -  Patient   Ms. Huckeba has known bilateral sensorineural hearing loss. Her hearing was last tested on 03/16/2023 and results can be found under the Media tab. She reports she was first diagnosed with hearing loss about 20 years ago and started wearing hearing aids around that time. In 2021, there was a significant decline in hearing. She is having trouble hearing conversations, even in quiet settings, hearing alarms/alerts in her home, she worries how hearing loss may affect her safety when driving, has trouble understanding when people wear masks due to relying on lipreading. She states she talked about a cochlear implant with Dr. Thaddeus several years ago, but did not pursue it due to not having insurance at the time.    Cochlear Implant Candidacy Evaluation Tests from the Minimum Speech Test Battery for Adult Cochlear Implant Users were utilized.  The [employer] sentences and Consonant Nucleus Consonant (CNC) words were used to assess speech perception abilities without any visual cues.  Stimuli were presented at a level of 60dB SPL using a prerecorded compact disc in the sound field.  The [place] Bio sentences are random so that contextual cues are minimized.  CNC words are monosyllabic words with an equal phoneme distribution as in the English language.  Bamford-Kowal-Bench (BKB -SIN) sentences are used to test the signal-to-noise ratio at which a person can understand 50% of key words.  A normal-hearing adult would typically obtain 50% correct at a -2.5 dB SNR.  A higher score on this test would indicate greater difficulty understanding speech in a noisy environment.   Hearing aids used for testing were the clinic's Phonak Naida P90-UP hearing aids.  The hearing aids were verified to meet NAL-NL2 prescriptive targets.    Evaluation of aural rehabilitation status: start time - 7:58am, stop time -  8:31am.      [employer] sentences in Quiet CNC Words CNC Phonemes  Binaural Hearing Aids 36% ----- -----  Right Hearing Aid 35% 16% 38%  Left Hearing Aid 27% 4% 14%    Summary & Recommendations Today's results were reviewed the patient. Based on today's testing, Ms. Chevere would likely benefit from a cochlear implant. Typically the poorer ear is implanted pending no surgical or medical contraindications. We reviewed that preservation of residual hearing following surgery cannot be guaranteed. The implantation process was discussed with Ms. Lacks in detail. It was also discussed that sound may initially only be perceived as beeps, buzzes, noise, and/or robotic. Factors that can impact success with a cochlear implant include age, degree of hearing loss, onset of hearing loss, and amplification history. She was provided with information for both MED-EL and Cochlear and will decide in the interim which manufacturer she wishes to use. She will need to return to this clinic for a cochlear implant device selection appointment prior to implantation. Ms. Tisdell is scheduled for a cochlear implant surgical consultation appointment with Dr. Camellia Thaddeus, MD following this appointment.          Diagnostic Studies:      11-28-19 MRI brain  scan:     IMPRESSION = Normal exam.    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              12-04-19  Vestibular Evaluation:    Test results 1.  No gaze or spontaneous nystagmus is noted. 2.  Vestibular function tests performed today  including rotational chair, warm water mono-thermal caloric irrigation, lateral vHIT, skull vibration, oculomotor assessment, and positional tests, are considered normal.     Impressions 1.  No evidence of peripheral or central vestibular dysfunction . 2. The patient meets some of the criteria for Vestibular Migraine as defined by the International Headache Society and [facility]  for, which includes: 1. At least 5 episodes of vestibular symptoms, 2. Current or previous history of migraine,  3. Headache, photophobia, phonophobia, or visual aura with at least 50% of episodes, and 4. Not better accounted for by another diagnosis.     Recommendations 1.  The patient was provided with literature regarding both vestibular migraine and Mnire's disease. 2.  She  will follow-up with Dr. Camellia Milliner, otologist.       03/30/2023 CT scan of temporal bones without contrast   IMPRESSION:   No findings to exclude cochlear implantation.        My note: ? 2 1/2 turns L cochlea         Impression:   58 y.o.  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  Some of her vertigo has been positional.  Some of her vertigo is consistent with vestibular migraine.  She currently is on disability due to her hearing loss and Mnire's disease.    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  She is experiencing migraine headaches without aura.  She may be experiencing some migraine associated vertigo.  She is also having  some positional vertigo and had some in the office when she was laid in the supine position with her head to the left. She underwent a complete vestibular evaluation on December 04, 2019 and was not found to have any peripheral or central vestibular dysfunction.  An MRI brain scan with and without contrast did not show any retrocochlear pathology.    On November 21, 2019, she was placed on oral Dyazide  37.5 mg daily, low-sodium diet, and  rescue medications at home such as Zofran , Phenergan , and diazepam .  She was also placed on on riboflavin and magnesium  and was urged to follow a Headache I migraine elimination diet.  Joesph placed her on beta histamine and this seems to  have helped her.   She is no longer wearing hearing aids as she cannot tolerate the aids being in her ears.  She is now communicating using a voice to text app.  Her hearing aids are not providing her with much information.    We had a third long discussion today with her husband concerning cochlear implants.  I reviewed cochlear implants in general, cochlear implant operations, and cochlear implant outcomes. She has been shown MedEL and Cochlear [employer] cochlear implant internal receivers.   A CT scan of her temporal bones without contrast was performed on 03/30/2023 and showed a patent left cochlea.  She may not have 2-1/2 turns on the left.     Her healthcare provider has agreed to  cover cochlear implant for her and the approval is through the end of July 2025.  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  In addition, she may require a pelvic reconstruction.  Because of these medical issues, I will have Angie request that her insurance extend the approval time for the cochlear implant.   She has undergone a cochlear implant evaluation with Elveria.  She meets cochlear implant criteria.  I would recommend implanting her left ear with a Cochlear [employer] [phone] lateral wall electrode (20mm length) and use the [employer] robot to help try to preserve her residual hearing in the left ear.  She understands that I cannot guarantee that I can preserve residual hearing even with a robotic assisted implant.  She also understands that one third of patients are low performers, one third are middle performers and one third are high performers.  I cannot guarantee her that she will hear with a cochlear implant.  Patient is now beyond hearing aids.   She is currently taking care of her 16 year old mother, with a high cervical fracture, who also has hearing loss.   She has residual hearing in the left ear would be a good candidate for a robotic-assisted  cochlear implant, left ear, using a shorter electrode such as a CI 624, [facility], general anesthesia, 4 hours, as an outpatient.  She will need to have a preoperative anesthesia consult.     INFORMED CONSENT FOR EAR SURGERY PROCEDURES:   Risks, complications, and alternatives of the procedure have been explained to the patient. Questions have been invited and answered. Informed consent has been provided in a quiet Otolaryngology Clinic environment, signed, and witnessed. Preoperative teaching and counseling have been provided. The patient and/or parents have elected to proceed with the procedure(s).    It was discussed with the patient that these facilities include a teaching institution and that residents and fellows who are physicians in graduate medical training, non-physician practitioners including physician assistants and nurse practitioners, and students in training to be physicians may assist in their care, which includes performing portions of the operation under my supervision.  I will be present for all critical portions of the operation.   Potential Risks and Consequences:    -- infection (including meningitis, cerebritis, etc.) of 1-5%, either immediate or delayed -- bleeding (1-5%), immediate or delayed, requiring additional procedures -- reaction to anesthesia (1/10,000) eg trouble breathing, long wake up times, high fevers -- early or delayed incisional, external ear, and/or scalp numbness or pain -- hypertrophic scarring/keloid formation, sometimes requiring additional surgery -- partial or total loss of hearing, either immediate or delayed -- immediate or delayed sudden hearing loss -- immediate or delayed perforation of the eardrum (tympanic membrane) -- worsening of hearing, either immediately or delayed  -- hearing results that are variable (25%)   -- taste disturbance (25% with 5% longer term >1 year) -- vertigo/dizziness (15-20%) -- facial nerve paresis or  paralysis (1-5%) -- narrowing or closure of the ear canal that may require additional operations -- immediate or delayed cerebrospinal fluid (CSF) leak (1%) -- immediate or delayed headaches, seizures or other neurologic symptoms -- short-term or long-term tinnitus, either immediate or delayed, in one or both ears -- early or late extrusion of prostheses, implants, or cement that have been implanted  --allergic reactions to medications, prostheses/implants/cement -- failure of grafts to heal requiring additional surgical procedures -- failure of hearing implants to improve hearing -- failure  of hearing implants (hard or soft failures) with need for explantation/reimplantation -- failure of prostheses to improve hearing -- failure of implanted electronic device(s) for known or unknown reasons -- residual tympanic membrane perforations, either immediate or delayed -- need for additional or revision ear operations (25-35%) -- sigmoid sinus thrombosis causing possible temporal lobe edema (1%) -- development of cholesteatoma requiring additional surgical procedures to remove -- reaction to anesthesia such as delayed wake-up times, high fevers, or difficulty breathing, death -- unforeseen cognitive impairments secondary to anesthesia -- injuries related to intraoperative positioning or prolonged anesthesia -- neurologic incapacity, nerve injury, or death -- other immediate or delayed complications that are either unanticipated or unpredictable given known data --Hard or soft failure of implanted cochlear implant electronic components possibly requiring explantation and reimplantation     Risk and Consequences of doing nothing:    --continued hearing loss, including complete loss of hearing --all of the risks listed above with the exception of reaction to anesthesia.   Alternatives: doing nothing   Possible risks and benefits of these alternatives: same   Type of Surgery: The  patient/parents understand(s) the type of surgery that they have consented to having   Purpose of the Surgery: The patient/parents understand(s) the purpose of the surgery.   What We Hope to Accomplish: The patient/parents understand(s) what we expect to accomplish with the recommended surgical procedure.   Risks Explained in Understandable Terms:  The risks have been explained to the patient/parents in understandable terms.   Understanding of Surgical Risks: The patient/parents understand the associated surgical risks.   Understanding That There Are No Guarantees:     --The patient/parents understand(s) that there are no guarantees that that surgical procedure will be able to successfully treat their condition.   All Questions Answered:  The patient/parent(s) are satisfied that their questions concerning the procedure(s)  have been answered.   Anesthesia:  The patient/parent(s) have been informed that anesthesia will be used during their procedure and that they have been                       informed concerning potential adverse effects of general anesthetic and sedation drugs that are used during the administration of anesthesia.    Name of Operation:  The patient/parent(s) have been told the name of the procedure(s).   Primary Surgeon:  The patient/parents understand who their primary surgeon will be for the procedure.   Authorization: I authorize my physician to proceed with such additional operations, except as noted herein (If no exception, write none): NONE    Comments: None           Plan:   -- Proceed with a left robotic assisted Cochlear [employer] CI 624 cochlear implant, 4 hours, general anesthesia, [facility], as an outpatient.   --Note: The patient refuses to be vaccinated with PCV 21 as recommended 2 weeks prior to having a cochlear implant.  She understands the risks and possible complications of not being vaccinated.  She understands that she could develop  meningitis postoperatively and she is willing to accept the risk.       1. Meniere's disease, cochleovestibular, active, right        2. Sensorineural hearing loss (SNHL), bilateral        3. Tinnitus aurium, bilateral        4. Migraine without aura and without status migrainosus, not intractable             Documentation of  time spent today with the patient and any additional time spent today on the patient's behalf:   Category Time (minutes)  Time in room:    Time out of room:    Total time with patient:    Additional time (audio, scans, tests, etc.):    Total time spent today:         Voice-to-Text Disclaimer:    The contents of this note were partially generated with the assistance of the voice-to-text dictation system, Dragon Medical One. I attest to the above information and documentation. However, this note has been created using voice recognition software and may have errors that were not dictated and were not seen during editing.   Camellia EMERSON Milliner, M.D., M.S., F.A.C.S. Assistant Professor, Otology/Neurotology Department of Otolaryngology-Head & Neck Surgery [facility]  [place], [place] [address] [place] Phone: [phone] Email: Email: [email or web address]

## 2024-02-29 NOTE — Anesthesia Postprocedure Evaluation (Signed)
 Patient: ANJANAE WOEHRLE  Procedure Summary     Date: 02/27/24 Room / Location: [facility] / [facility]   Anesthesia Start: 0825 Anesthesia Stop: 1239   Procedure: Left robotic assisted Cochlear [employer] [phone] cochlear implant (Left: Ear) Diagnosis:      Bilateral sensorineural hearing loss     (Bilateral sensorineural hearing loss [H90.3])   Surgeons: Camellia Layman Milliner, MD Responsible Provider: Elveria Almarie Riley, MD   Anesthesia Type: general ASA Status: 2       Anesthesia Type: general  Vitals Value Taken Time  BP 97/63 02/27/24 13:45  Temp 98.1 F (36.7 C) 02/27/24 12:34  Pulse 93 02/27/24 13:51  Resp 11 02/27/24 13:51  SpO2 96 % 02/27/24 13:50  Vitals shown include unfiled device data.  No notable events documented.  Anesthesia Post Evaluation  Final anesthesia type: general Patient location during evaluation: PACU Patient participation: Patient participated Level of consciousness: awake and alert Pain score: pain well controlled (patient comfortable/resting) Pain management: adequately controlled during entire PACU stay Post-op nausea and vomiting?: none Post-op vital signs: post-procedure vital signs are stable Cardiovascular status: hemodynamically stable Respiratory status: Stable, room air, spontaneous Hydration status: adequately hydrated Post-op disposition: Home Anesthesia post-op complications?:no complications

## 2024-03-06 NOTE — Progress Notes (Signed)
 03/07/2024    Location: [facility] at [place]    Precautions: Gloves were worn during the patient's evaluation. SARS-CoV-2                         precautions were followed.  Provider: Camellia EMERSON Milliner, M.D., M.S., F.A.C.S.  Visit Type:  Postoperative Adult  Encounter Date: March 07, 2024  Patient Name:  Sara Briggs     Medical Record Number (MRN): [id number]                          Sex:  female  Date of Birth:  [birth year]                                Age: 58 y.o.  Phone number(s): [phone] (home)                       [phone] (mobile)  Primary Dr:  Tylene KATHEE Meres, PA  Referring Provider: No ref. provider found  Person(s) accompanying patient: husband  Interpreter: None    CC: 1-week postoperative follow-up after undergoing a left robotic assisted Cochlear Americas CI 1022 cochlear implant Progressive loss of hearing in both ears, R cochlear & vestibular Meniere's, vestibular migraine  Chief Complaint  Patient presents with  . Post-op     Patient Active Problem List  Diagnosis  . Vertigo  . Sensorineural hearing loss (SNHL), bilateral  . Migraine without aura and without status migrainosus, not intractable  . Tinnitus aurium, bilateral  . Benign paroxysmal positional vertigo of left ear  . Meniere's disease, cochleovestibular, active, right  . Tinnitus of both ears  . Bilateral sensorineural hearing loss       11-21-19 HPI:    Sara Briggs is a 58 y.o. female who is here today with the chief complaint of episodic vertigo and bilateral asymmetric sensorineural hearing losses    This is her first visit to see me at the Otolaryngology Clinic in [place].    The patient was evaluated by Dr. Norleen Notice at [facility] in [place], [place]  on May 24, 2017 for possible BPPV.  She had a 2 to 41-month history of intermittent vertigo and a 6 to 7-year history of bilateral hearing loss.  She also  complained of  progressively tinnitus that was becoming worse with time.  There is a history of hearing loss in her mother and several members of her family.  She was checked for BPPV and was negative.  An MRI brain scan was declined. She was recommended  for vestibular rehabilitation.    On 12-20-18, the patient was evaluated again at [facility] by Velia Pry, P.A:      She had resolution of symptoms for several months. About two months ago it returned. She describes having a mild baseline dizziness that is tolerable; she takes it slow to turn around or bend over. About twice per month she has a severe episode described  as spinning that comes on without antecedent event; she almost always take Meclizine that knocks me out for two days. 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 She cannot say how long the vertigo lasts as she always takes a vestibular  suppressant that makes her very drowsy; it seems to last more than 24 hours and is not associated with  change in head position. She has long-standing sensorineural hearing loss, slightly worse in the right ear. Reports long-standing, bilateral, non-pulsatile  tinnitus that is worse with stress/anxiety. She wears bilateral hearing aids. She cannot say for sure whether her hearing acutely changes or the tinnitus changes when she has a spell of vertigo.    Today, the patient reports that she has developed very loud constant, nonpulsatile, tinnitus in both ears that sound like an alarm sound.  It is a humming sound to a metal sound.  The tinnitus has been fluctuating in loudness.  She has to listen to music  to try to keep the tinnitus from overwhelming her.  She notices an increase in a tinnitus with stress.  The tinnitus does keep her from falling asleep and sometimes awakens her from sleep.    She reports experiencing spells of vertigo associated with nausea but no vomiting.  Her first spell was 1-1/2 to 2 months ago while she was at church.   She has taken Antivert without much success.  The spells last 24 to 48 hours.  She does have an occasional headache with the vertigo and has photophobia.  She does not  report a visual aura but has some trouble focusing.    Her daughter has migraine disease.  Her mother side of the family has hearing loss and speech issues.  The patient denies any previous ear operations or childhood ear infections.  She is having increased  difficulty hearing people with masks.      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      01-02-20 HPI:    Today, the patient reports that she has had 3 spells of vertigo since last being seen.  One spell was following leaving her last clinic visit with me.  The second spell occurred later, and the third spell occurred after she underwent vestibular testing.   She is very bothered by strobe lights.  She denies otorrhea or otalgia.  She is found that diazepam  does work for her when she has the vertigo.      08-05-2021 HPI:    The patient was worked in today as a special patient due to a recent audiogram at her hearing aid dealer and fitters that showed a marked change in her hearing.       Today, the patient reports that she thinks that her hearing has decreased progressively over the past several years.  She denied otorrhea or otalgia.  She still has vestibular migraines with spells of vertigo and reports bilateral tinnitus.  The tinnitus changes sometimes to sound like an alarm sound in her head.    She underwent an audiogram on 08/04/2021 where she obtained her hearing aids, and they found a marked change in her hearing.  However, they felt that she had SRT's of 95 DB with 65% word recognition in the right ear and 15% word recognition in the left ear.  They did not perform masking as per the  audiogram.    Patient is currently struggling to hear.  This was corroborated by her husband.      When she wears her hearing aids, they produce migraine symptoms.  She prefers to not wear her hearing aids so that she does not experience migraine disease.  She feels that the  hearing aids produce a suction like feeling in both ears.  She does not have a suction like feeling when she does not wear the hearing aids.     06/01/2023 HPI:  Today, the patient reports that she has been experience some flareup of her Mnire's disease.  She feels as if she is on a boat rocking back and forth.  Because of her progressive hearing loss in both ears and the Mnire's disease, she is now on disability.  This started November 2024.  She is also taking care of an 76 year old mother.  She reports tinnitus louder in her right ear than her left ear tinnitus is constant and changes with the weather.  She was started on betahistine by Joesph and feels the medication has definitely helped her.  She has been able to wear hearing aids as they cause a lot of pressure sensation for her, and she cannot wear her right hearing aid.  She is struggling to hear in most situations and is speech reading.  She was here today to discuss details of a cochlear implant.  She had many questions.  I answered all of her questions.  She denied any recent otorrhea or otalgia.  Many of her symptoms are consistent with vestibular migraine.     08/31/2023 HPI:  Today, the patient reports that she continues to struggle to hear even with her hearing aids.  She is using a voice-to-text app.  She is finding it increasingly difficult to converse with her husband and her elderly mother.  Her insurance provider has agreed to cover a cochlear implant for her.  The procedure has been approved through the end of July 2025.  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  She is scheduled to see her dentist on 09/14/2023 and most  likely undergo extractions in early June 2025.  She also has developed some pelvic issues following delivery of her children and may be recommended for a pelvic reconstruction on 09/07/2023.  She is unsure about this procedure.  She would like to proceed now with a left cochlear implant and consider wearing a Resound hearing aid in the right ear.  She understands that PVC-21 is recommended 2 weeks prior to having a cochlear implant.  She has elected to refuse the vaccination and reports excepting the risk of possible postoperative meningitis.   02/27/2024 operation: Left robotic assisted Cochlear Americas CI 1022 cochlear implant  PREOPERATIVE DIAGNOSIS:   Profound bilateral sensorineural hearing loss beyond hearing aids with low frequency shoulder audiogram   POSTOPERATIVE DIAGNOSIS:   Profound bilateral sensorineural hearing loss beyond hearing aids with high frequency shoulder audiogram   PROCEDURE(S):   1. Left robotic-assisted (IotaSOFT) Cochlear Americas CI 1022 cochlear implant    INDICATIONS & JUSTIFICATION FOR PROCEDURE(S):   AUSHA SIEH is a 58 y.o. y/o female with a history of progressive hearing loss in both ears, right cochlear and vestibular Mnire's disease, and vestibular migraine. She has become interested in a left cochlear implant.  She is an audiometric candidate for a left-sided CI 1022 cochlear implant.   She presented today to undergo a left robotic-assisted cochlear implant after discussion of the risks, benefits, and alternatives and a cochlear implant evaluation, CI testing, and CT scanning of her temporal bones.    Risks, complications, and alternatives of the procedure have been explained to the patient and to her husband. Questions have been invited and answered. Informed consent has been provided in a quiet environment,  signed, and witnessed. Preoperative teaching and counseling have been provided. The patient has elected to proceed with the procedure.     FINDINGS:    --The patient underwent a intentional partial insertion (15 mm) of a left cochlear Americas CI 1022 cochlear implant, serial # [id number] without complication, in an attempt to preserve her residual low frequency hearing. --An intentional partial electrode insertion was performed at 0.33mm/sec using the IotaSOFT Robot version 2 --Intraoperative electrode testing revealed all impedances were normal. Neural response were obtained on all  electrodes, except electrodes 1 through 4. --The lateral semi-circular canal was identified in the usual position.  --The facial recess was opened, and the facial nerve was identified with bony covering intact.  --The facial nerve stimulated at 0.5mA throughout the procedure. --A moderate bony lip of the round window niche was removed with a Stryker S2 mini using a 1.0 mm fine diamond bur and Stryker pi drill with a extended 1 mm fine diamond bur. --The round window membrane was found 2 mm inferior to the oval window in normal position.   --The stapedial tendon was noted to be a little more inferior in mesotympanum --The ossicles were normally mobile.  --Chorda tympani nerve was not disturbed. --Sponge, needle, and cotton ball counts were correct at the termination of the operation. --No complications     03/07/2024 HPI: 1-week postoperative follow-up after undergoing a left robotic assisted Cochlear Americas CI 1022 cochlear implant  Today, the patient reports that she has experienced some intermittent sharp pain in her left ear.  She denied any vertigo, otorrhea or tinnitus.  She is here today for follow-up and suture removal.      Allergies  Allergen Reactions  . Apple Rash and Swelling    Mouth and tongue swelling  . Pineapple Rash  . Adhesive Itching    takes off skin  . Metformin GI Intolerance  . Saxagliptin Other (See Comments)  . Hydrocodone -Acetaminophen  Rash  . Oxycodone -Acetaminophen  Rash  . Sulfamethoxazole Rash       Current Outpatient Medications on File Prior to Visit  Medication Sig Dispense Refill  . acetaminophen  (TYLENOL ) 500 mg tablet Take 500 mg by mouth every 6 (six) hours as needed.    SABRA aspirin-acetaminophen -caffeine (EXCEDRIN ES) 250-250-65 mg tab per tablet Take 2 tablets by mouth every 6 (six) hours as needed for headaches or migraine.    . betahistine HCl (BETAHISTINE, BULK, MISC) 24 mg by Not Applicable route 2 (two) times a day.    . black cohosh root (BLACK COHOSH ORAL) Take 540 mg by mouth daily.    . calcium  citrate (CALCITRATE) 950 mg (200 mg calcium ) tab Take 500 mg by mouth 2 (two) times a day.    . celecoxib (CeleBREX) 200 mg capsule Take 1 capsule (200 mg total) by mouth 2 (two) times a day for 14 days. 28 capsule 0  . cephALEXin (KEFLEX) 500 mg capsule Take 1 capsule (500 mg total) by mouth 4 (four) times a day for 10 days. 40 capsule 0  . cetirizine (ZyrTEC) 10 mg tablet Take 10 mg by mouth as needed.    . clobetasoL (TEMOVATE) 0.05 % ointment Apply 1 Application topically as needed.    . cranberry extract 200 mg cap Take 1 capsule by mouth 2 (two) times a day.    . cyclobenzaprine (FLEXERIL) 10 mg tablet Take 10 mg by mouth as needed in the morning and 10 mg as needed at noon and 10 mg as needed in the evening.    SABRA  d-mannose 500 mg cap Take 1 capsule by mouth daily.    . diazePAM  (VALIUM ) 5 mg tablet Take 5 mg by mouth every 6 (six) hours as needed for up to 10 doses. 10 tablet 0  . diazePAM  (VALIUM ) 5 mg tablet Insert 10 mg into the vagina every 8 (eight) hours as needed (Pelvis floor relaxation).    . diphenhydrAMINE  (BENADRYL ) 25 mg tablet Take 12.5 mg by mouth nightly as needed for sleep.    SABRA elderberry fruit 50 mg/5 mL syrp Take by mouth as needed.    . empagliflozin (Jardiance) 10 mg tab Take 10 mg by mouth daily.    . estradioL  (ESTRACE ) 0.01 % (0.1 mg/gram) vaginal cream Insert into the vagina 3 (three) times a week.    . fluticasone propionate (FLONASE) 50  mcg/spray nasal spray Administer 2 sprays into each nostril as needed.    SABRA glucosamine sulfate 750 mg tab Take 2 tablets by mouth daily.    SABRA glucose blood (Accu-Chek Guide test strips) test strip     . hydrocortisone 2.5 % cream 1 Application 2 (two) times a day.    . linaGLIPtin  (Tradjenta ) 5 mg tab Take 5 mg by mouth in the morning.    . loratadine (CLARITIN) 10 mg tablet Take 10 mg by mouth daily.    . MAGNESIUM  CITRATE ORAL Take 1 tablet by mouth daily.    . magnesium  oxide 400 mg (241 mg magnesium ) tab Take 500 mg by mouth daily. 1 daily alternating with 2 every other day    . melatonin 10 mg cap Take 10 mg by mouth nightly.    . metaxalone (SKELAXIN) 400 mg tab tablet Take 400 mg by mouth as needed.    . miscellaneous medical supply misc Betahistine 16mg  PO TID take with meals.  Patient instructions: Visit [email or web address], create an account/profile, upload signed Rx. Estimate cost: $65-90 for 46mo. Please notify/call our office well in advance (2+ months before) future refills are needed to allow for processing/delivery time. (Patient taking differently: Take 24 mg by mouth 2 (two) times a day. Betahistine 16mg  PO TID take with meals.  Patient instructions: Visit [email or web address], create an account/profile, upload signed Rx. Estimate cost: $65-90 for 46mo. Please notify/call our office well in advance (2+ months before) future refills are needed to allow for processing/delivery time.) 300 each 4  . multivitamine, geriatric, (Multivitamin 50 Plus) tab Take 1 tablet by mouth daily.    . mupirocin (BACTROBAN) 2 % ointment Apply to incision twice daily for 7 days 22 g 1  . NAPROXEN ORAL Take 220 mg by mouth 2 (two) times a day as needed for mild pain (1-3).    SABRA omega-3s-dha-epa-fish oil (Fish OiL) 350-600 mg cap Take 2 capsules by mouth daily.    . ondansetron  (ZOFRAN -ODT) 4 mg disintegrating tablet Dissolve 2 tablets (8 mg total) on tongue every 8 (eight) hours as needed for nausea or  vomiting. 20 tablet 1  . ondansetron  (ZOFRAN -ODT) 8 mg disintegrating tablet Dissolve 8 mg on tongue every 8 (eight) hours as needed.    . sour cherry extract (TART CHERRY EXTRACT ORAL) Take 1,200 mg by mouth daily.    SABRA terconazole (TERAZOL 7) 0.4 % crea vaginal cream Insert 1 applicator into the vagina.    . traMADoL  (ULTRAM ) 50 mg tablet Take 1 tablet (50 mg total) by mouth every 6 (six) hours as needed for moderate pain (4-6). 10 tablet 0  . triamterene -hydroCHLOROthiazide  (DYAZIDE ) 37.5-25 mg per capsule TAKE ONE  CAPSULE BY MOUTH DAILY 90 capsule 0   No current facility-administered medications on file prior to visit.      Past Medical History:  Diagnosis Date  . Abdominal hemorrhage   . Allergy   . Diabetes mellitus   . Hearing loss   . Tachycardia   . Tinnitus, bilateral   . Vertigo       Past Surgical History:  Procedure Laterality Date  . CESAREAN SECTION, UNSPECIFIED     Procedure: CESAREAN SECTION  . COCHLEAR IMPLANT REVISION Left 02/27/2024   Left robotic assisted Cochlear [employer] [phone] cochlear implant performed by Camellia Layman Milliner, MD at [facility] OR  . COLPORRHAPHY     Procedure: A&P REPAIR WITH CYSTO  . TUBAL LIGATION Bilateral    Procedure: TUBAL LIGATION     Family History  Problem Relation Name Age of Onset  . Hypertension Mother    . Kidney failure Mother    . Diabetes Mother          Reports that she has never smoked. She has never used smokeless tobacco. She reports that she does not currently use alcohol. She reports that she does not use drugs.  She is not on any weight loss medications and is not injecting any medications.  She is not on any anticoagulants.    ROS: Patient is under stress as her mother has been ill and recently hospitalized.   A complete ROS was performed with pertinent positives/negatives noted in the HPI. The remainder of the ROS are negative.    Prior to this encounter, I have reviewed the patient's current medications  and allergies, medical/family/and social histories as well as the patient's vital signs as available.      PHYSICAL EXAMINATION:  Vitals:   03/07/24 1204  BP: 103/70  Pulse: 71  Temp: 97.5 F (36.4 C)  SpO2: 97%       General: Well-developed, well-nourished.  No recognizable syndromes or patterns of malformation.  No acute distress.  Awake, alert, coherent, spontaneous and logical.  Speech: Speech is within normal limits.  Communicates without difficulty.  Head: Normocephalic without lesions or trauma.  The left postauricular incision was well-healed.  Sutures were removed without difficulty.  See the procedure note below.  The ICS is in stable position.  Face: Facial function is intact and symmetric.  Eyes: PERRLA with EOMI.  Ears: Otomicroscopic examination performed with the use of the microscope  Right Auricle: Normal location, size, shape, and angulation and No skin lesions or signs of inflammation or infection  Left Auricle:       Normal location, size, shape, and angulation and No skin lesions or signs of inflammation or infection  Right Mastoid Cavity:   Left Mastoid Cavity:   R External Auditory Canal: Normal canal width and length., No signs of infection., No stenosis., and No masses.  L External Auditory Canal: Normal canal width and length., No signs of infection., No stenosis., and No masses.  R Tympanic Membrane: The tympanic membrane was clear and mobile., There was an air containing middle ear space., No evidence of infection., and No evidence of cholesteatoma.  L Tympanic Membrane: There is a left hemotympanum as expected.  No signs of any infection  T[place] Fork Testing:   Nose: Normal external nasal examination.  Oral Cavity:   Nasopharynx:   Larynx:   Neck:   Chest:   Cardiac:   Neurologic: Oriented x3 , cranial nerves II-XII were intact except for VIIIth nerve(s)  Other:  Procedure: Removal of left posterior auricular sutures  With the patient's  permission, she was placed in the supine position.  The left postauricular incision was well-healed following the cochlear implant procedure.  The incision was prepped with alcohol and Betadine.  Sutures were removed without difficulty.  The incision was then reprepped with Betadine and alcohol.  No signs of any infection.  The ICS is in stable position.   She tolerated the procedure well and left the clinic in stable condition.     Audiometric Testing:    08-28-19 Audiogram: outside audiogram from [place]    My independent interpretation of the audiogram is that the patient has bilateral moderately severe to severe asymmetric high-frequency sensorineural hearing losses, right ear greater than left ear.  She also has bilateral asymmetric low-frequency sensorineural  hearing losses right ear greater than left ear.  She had an SRT of 45 dB right ear and 30 dB left ear with 72% discrimination right ear and 60% discrimination left ear.          12-04-19 Audiogram:             08/04/2021 audiogram:    My independent interpretation of the audiogram is that the patient has severe to profound high-frequency sensorineural hearing loss in both ears with SRT's of 95 DB bilaterally, which is unlikely,  and word recognition of 65% right ear and 15% left ear.  The audiogram is not inherently consistent.  It does not appear that masking was used.  I asked our audiologist to repeat the audiogram.          08/05/2021 audiogram by Arnaldo Don, AuD    My independent interpretation of today's audiogram is that the patient has bilateral moderately severe high-frequency sensorineural hearing losses with SRT's of 30 DB right ear and 25 DB left ear  with 28% discrimination right ear and 24% discrimination left ear.  She has had a marked change in her word recognition ability as compared to her 12/04/2019 audiogram.  At this time, she had 64% word recognition in the right ear and 76% word recognition   in the left ear.             03/16/2023 Audiogram      06/01/2023  Audiogram:  My independent interpretation of the audiogram is that the patient has undergone [place] bio sentence testing.  In quiet with both the ears 36%, quiet right ear 35% in quiet left ear 27%.  She did slightly better on CNC words and phonemes in the right ear than the left ear.  See the scores below.       Audiology Consultations   08/05/2021 Audiology Adult evaluation by Arnaldo Don,, AUD  REASON FOR VISIT  Audiologic monitoring and ENT follow-up due to concerns for sudden hearing loss.     Patient has a history of vertigo, bilateral constant tinnitus, and known progressive bilateral sensorineural hearing loss. Vestibular evaluation on 12/04/19 found that patient meets criteria for vestibular migraine.     Patient's last audiogram at [facility] on 12/04/19 found normal hearing through 750 Hz sloping to a severe sensorineural hearing loss bilaterally. WRS was 64% at 95 dB HL in the right ear  and 76 % at 95 dB HL in the left ear. Patient currently wears O[employer]on Opn S1 miniRITE hearing aids.     The patient was seen urgently today at the request of Camellia Milliner, MD, in Otolaryngology due to a recent audiogram from her local audiologist that  showed a marked change in her hearing. Scanned 08/04/21 audiogram from [facility] shows bilateral SRTs of 95 dB HL, and NU-6 list word recognition scores of 65% at 95 dB HL in the right ear and 15% at 95 dB HL in the left ear.    RETURN HISTORY  Provided by: Patient/Medical record.    Today, patient reports a subjective bilateral decrease in hearing. Patient reports she is still experiencing vestibular migraines with spells of vertigo and bilateral tinnitus like an alarm. She feels that when she wears her hearing aids, they produce  migraine symptoms. She has not been wearing them frequently, and reports she is struggling to hear conversation.    AUDIOLOGICAL  EVALUATION  Otoscopy  Right Ear: Clear EAC  Left Ear: Clear EAC    Audiometric Testing  Today's results were obtained using conventional audiometry and insert earphones, with good reliability.    Right Ear: Speech reception threshold obtained at 30 dB HL. Threshold testing revealed normal hearing from 250-500 Hz, sloping to a mild to profound sensorineural hearing loss (SNHL).    Left Ear: Speech reception threshold obtained at 25 dB HL. Threshold testing revealed normal hearing from 250-500 Hz, sloping to a mild to profound SNHL.    Today's test results are available on the audiogram in the Media tab.    Word Recognition Testing  Right Ear:  28 % at 95 dB HL with effective contralateral masking.  Left Ear:   24 % at 95 dB HL with effective contralateral masking.    SUMMARY  Patient has a mild to profound SNHL from 750-8k Hz bilaterally. Word recognition ability in quiet is very poor in both ears at suprathreshold level. Today's results were reviewed with the patient and  her husband. Patient will return to Dr. Thaddeus to discuss today's results and next steps.    Compared to last [facility] audiogram on 12/04/19, there has been a significant change (>=10+) in thresholds at [phone] Hz in the right ear and from 750-4k Hz in the left ear (see below). WRS has dropped from 64% to 28% at 95 dB HL in the right ear and  from 76% to 24% at 95 dB HL in the left ear.                 Hz  250  500   750   1000   1500   2000  3000  4000  6000  8000                RIGHT  +5  +5  +15  +15  +15  +5  +5  +5  0  +5                LEFT  0  +5  +25  +30  +35  +20  +15  +10  +  0    *Please note: positive numbers indicate worse hearing thresholds. Greater than or equal to +/- 10 dB considered a significant change.    RECOMMENDATIONS  Ms.   Losano is scheduled for an Otolaryngology appointment after this visit.  Monitor Ms. Christmas's hearing following  medical management.   03/16/2023 Adult Audiology evaluation  by Roxann Marrow  REASON FOR VISIT -    Ms. Tanda , 58 y.o. female, was seen today for audiologic monitoring in conjunction with Otolaryngology. She was accompanied to today's appointment by her husband.    Ms. Allensworth has a significant history of vertigo, bilateral  constant tinnitus, and known progressive bilateral sensorineural hearing loss. Vestibular evaluation on 12/04/19. She was last evaluated by audiology on 08/05/2021 at which time results revealed mild to profound sensorineural hearing loss from 750-8k Hz bilaterally. Word recognition ability in quiet was very poor in both ears at suprathreshold level. She currently wears O[employer]on Opn S1 miniRITE hearing aids.    RETURN HISTORY Provided by -  Patient   Today, Ms. Steadman reports that she feels her hearing has gotten worse in both ears and she experiences significant difficulty understanding even when wearing her hearing aids. She continues to report constant ringing tinnitus in both ears that fluctuates in volume. She also reports a worsening in her overall balance, as she has been utilizing a cane to ambulate. She has been experiencing more frequent episodes of imbalance and identifies stress, complex visual stimuli, and changes in the barometric pressure as triggers. She denies recent otalgia or ear infections.    AUDIOLOGICAL EVALUATION Otoscopy Clear ear canals bilaterally     Audiometric Testing Reliability: Good Testing was completed under insert headphones   Results are as follows:  Right ear: Speech Reception Threshold (SRT) was obtained at 35 dB HL. Pure tone testing reveals normal hearing sensitivity at 250 Hz sloping to mild to profound sensorineural hearing loss from [phone]  Left ear: Speech Reception Threshold (SRT) was obtained at 45 dB HL. Pure tone testing reveals normal hearing sensitivity at 250 Hz sloping to mild to profound sensorineural hearing loss from [phone]   Compared to most recent hearing test on  08/05/2021, hearing has overall declined in both ears     Word Recognition Testing Right Ear:  32 % at 85 dB HL with effective contralateral masking.  Left Ear:   8 % at 85 dB HL with effective contralateral masking.    SUMMARY Today's results reveal bilateral mild to profound sensorineural hearing loss from [phone] that has worsened in both ears compared to most recent audiologic testing on 08/05/2021. The patient's word recognition abilities in quiet at suprathreshold are very poor in both ears.     RECOMMENDATIONS Ms.  Walstad is scheduled for an Otolaryngology appointment after this visit. Recommend hearing recheck if Ms. Serfass notices a change in hearing. Consider cochlear implantation given profound hearing loss and very poor word recognition abilities. Her and her husband were counseled on this and are interested in returning for a cochlear implant evaluation  Continue binaural amplification      06/01/2023 Audiology CI consultation with Elveria Medley, AUD  HISTORY Provided by -  Patient   Ms. Glendenning has known bilateral sensorineural hearing loss. Her hearing was last tested on 03/16/2023 and results can be found under the Media tab. She reports she was first diagnosed with hearing loss about 20 years ago and started wearing hearing aids around that time. In 2021, there was a significant decline in hearing. She is having trouble hearing conversations, even in quiet settings, hearing alarms/alerts in her home, she worries how hearing loss may affect her safety when driving, has trouble understanding when people wear masks due to relying on lipreading. She states she talked about a cochlear implant with Dr. Thaddeus several years ago, but did not pursue it due to not having insurance at the time.    Cochlear Implant Candidacy Evaluation Tests from the Minimum Speech Test Battery for Adult Cochlear Implant Users were utilized.  The [place] [employer] sentences and Consonant Nucleus Consonant (CNC)  words were used to assess speech perception  abilities without any visual cues.  Stimuli were presented at a level of 60dB SPL using a prerecorded compact disc in the sound field.  The [place] Bio sentences are random so that contextual cues are minimized.  CNC words are monosyllabic words with an equal phoneme distribution as in the English language.  Bamford-Kowal-Bench (BKB -SIN) sentences are used to test the signal-to-noise ratio at which a person can understand 50% of key words.  A normal-hearing adult would typically obtain 50% correct at a -2.5 dB SNR.  A higher score on this test would indicate greater difficulty understanding speech in a noisy environment.   Hearing aids used for testing were the clinic's Phonak Naida P90-UP hearing aids.  The hearing aids were verified to meet NAL-NL2 prescriptive targets.    Evaluation of aural rehabilitation status: start time - 7:58am, stop time - 8:31am.      [employer] sentences in Quiet CNC Words CNC Phonemes  Binaural Hearing Aids 36% ----- -----  Right Hearing Aid 35% 16% 38%  Left Hearing Aid 27% 4% 14%    Summary & Recommendations Today's results were reviewed the patient. Based on today's testing, Ms. Galeana would likely benefit from a cochlear implant. Typically the poorer ear is implanted pending no surgical or medical contraindications. We reviewed that preservation of residual hearing following surgery cannot be guaranteed. The implantation process was discussed with Ms. Kirt in detail. It was also discussed that sound may initially only be perceived as beeps, buzzes, noise, and/or robotic. Factors that can impact success with a cochlear implant include age, degree of hearing loss, onset of hearing loss, and amplification history. She was provided with information for both MED-EL and Cochlear and will decide in the interim which manufacturer she wishes to use. She will need to return to this clinic for a cochlear implant device selection appointment  prior to implantation. Ms. Bickle is scheduled for a cochlear implant surgical consultation appointment with Dr. Camellia Milliner, MD following this appointment.      Diagnostic Studies:      11-28-19 MRI brain scan:     IMPRESSION = Normal exam.    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            12-04-19  Vestibular Evaluation:    Test results 1.  No gaze or spontaneous nystagmus is noted. 2.  Vestibular function tests performed today  including rotational chair, warm water mono-thermal caloric irrigation, lateral vHIT, skull vibration, oculomotor assessment, and positional tests, are considered normal.     Impressions 1.  No evidence of peripheral or central vestibular dysfunction . 2. 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 At least 5 episodes of vestibular symptoms, 2. Current or previous history of migraine,  3. Headache, photophobia, phonophobia, or visual aura with at least 50% of episodes, and 4. Not better accounted for by another diagnosis.     Recommendations 1.  The patient was provided with literature regarding both vestibular migraine and Mnire's disease. 2.  She  will follow-up with Dr. Camellia Milliner, otologist.     03/30/2023 CT scan of temporal bones without contrast  IMPRESSION:   No findings to exclude cochlear implantation.     My note: ? 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  Her left postauricular incision was well-healed and sutures were removed without difficulty.  She reported some intermittent sharp pain in her left ear but no other major symptoms.   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  She is experiencing migraine headaches without aura.  She may be experiencing some migraine associated vertigo.  She is also having  some positional vertigo and had some in the office when she was laid in the supine position with her head to the left. She underwent a complete vestibular evaluation on December 04, 2019 and was not found to have any peripheral or central vestibular dysfunction.    On physical examination, the patient's left postauricular incision was well-healed.  Sutures were removed without difficulty.  See the procedure note above.  She has a left hemotympanum as expected.  No signs of infection.  The ICS is in stable position.  I reviewed the intraoperative findings with the patient and her husband.  She will finish her oral antibiotics.  Continue to apply mupirocin ointment to the postauricular incision every morning for 1 week.  She is to avoid heavy lifting or straining for the next 2 weeks and avoid flying in an airplane for 2 weeks.  She may reapply the left temple of her glasses.    Patient is scheduled on 04/04/2024 for loading and activation of her cochlear implant.  She is to contact me in if she develops any concerning symptoms or signs.  Note: The patient was recommended for a PCV-21 vaccination prior to her cochlear implant.  She declined to be vaccinated as recommended 2-weeks prior to having a cochlear implant.  She understands the risks and possible complications of not being vaccinated.  She understands that she could develop meningitis postoperatively, and she is willing to accept the risk.         Plan:    --Finish her oral antibiotic  --Avoid heavy lifting or straining for the next 2 weeks  --Avoid flying in an airplane for the next 2 weeks  --She is scheduled for cochlear implant activation on 04/04/2024  --Continue Dyazide  37.5 mg PO Qd  --Continue a low sodium diet M< 2000 mg/day   --Zofran  4 mg ODT Q406 hrs prn nausea  --Continue betahistine as per Joesph  --Phenergan  supp 25 mg PR Q6hrs. Prn nausea  --Diazepam  5 mg PO Q6-8 hrs prn vertigo   --Continue ribofavin 400 mg PO BID  --Continue Magnesium  500 mg PO Qday  --Return on 04/04/2024 after the left cochlear implant is activated     1. Cochlear implant follow-up      2. 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 Sensorineural hearing loss (SNHL), bilateral      4. Meniere's disease, cochleovestibular, active, right      5. Bilateral sensorineural hearing loss      6. Tinnitus aurium, bilateral          Documentation of time spent today with the patient and any additional time spent today on the patient's behalf:  Category Time (minutes)  Time in room:   Time out of room:   Total time with patient:   Additional time (audio, scans, tests, etc.):   Total time spent today:      Voice-to-Text Disclaimer:   The contents of this note were partially generated  with the assistance of the voice-to-text dictation system, Dragon Medical One. I attest to the above information and documentation. However, this note has been created using voice recognition software and may have errors that were not dictated and were not seen during editing.  Camellia EMERSON Milliner, M.D., M.S., F.A.C.S. Assistant Professor, Otology/Neurotology Department of Otolaryngology-Head & Neck Surgery [facility]  [place], [place] [address] [place] Email: [email or web address]

## 2024-03-13 NOTE — ED Notes (Signed)
 CT notified about transferring pt's CT images over to [facility] per [place], PA-C request.

## 2024-03-13 NOTE — ED Provider Notes (Signed)
 ------------------------------------------------------------------------------- Attestation signed by Sydell DELENA Sartorius, MD at 03/13/24 2318 I attest that I am the attending provider for this patient and have reviewed the medical decision-making and plan as part of ongoing quality measures.  I was physically present in the department and available for consultation.  This case was discussed with me during the patient's time in the ED.  -------------------------------------------------------------------------------   [name]  ED Provider Note  Sara Briggs 58 y.o. female DOB: 1965-12-11 MRN: [id number] History   Chief Complaint  Patient presents with  . Altered Mental Status    Per husband, patient had cochlear implant surgery a week ago. For the last 48 hours she has been more confused than normal, having severe eye pain on the same side as her surgery. Endorses that she has also had a long history of UTI in which she has had to have repeated catheterizations. Patient tearful in triage and is not able to interact due to being unable to hear and read lips at this time.   . Eye Pain   58 year old female presents with multiple complaints with her husband.  Husband helps to provide history.  Husband states she had a cochlear implant on the left side about a week ago.  Completed a course of Keflex after the procedure.  2 days ago started with severe left-sided ear pain starts behind the ear and radiates up towards the cochlear implant and behind the left eye.  Is having photophobia and pain behind the left eye.  Has difficulty describing her vision but grossly no severe vision changes.  Has not had any drainage from the ear or fevers.  Is having ringing in the left ear.  States it gets worse when she tries to things like a nurse and ringing improves and resolves when she can calm my brain.  She also started having urine retention again recently.  Patient states  she has had this multiple times in the past related to anesthesia after procedures.  She also states she had a pelvic floor surgery and has had some urine retention's as a result after this.  States she has had nursing friends help straight catheter at home as needed.  She is having urine retention again today.  Husband and patient report she had an episode of altered mental status couple months ago, were unclear if the cause, underwent psych evaluation.  States they self diagnosed possibly related to Lunesta which she was taking for sleep.  They stopped this and she had been doing okay since until patient seeming more altered over the past 2 days.  Husband states that she does not seem like herself and will have continuous speech rambling.  No extremity loss of sensation.  No other complaints or concerns  Patient has difficulty hearing but is able to read lips and when she calms down she is able to hear better.       Past Medical History:  Diagnosis Date  . Allergy    Seasonal  . Anxiety   . Bladder prolapse, female, acquired   . Cancer (*)    Basal ceel  . Cataracts, bilateral 2021   Mild  . Cystocele with rectocele   . Diabetes mellitus (*)   . Hard of hearing   . Hemorrhoids 1999   While pregnant  . IBS (irritable bowel syndrome)   . Inflammatory bowel disease 1989  . Meniere disease of both ears   . Migraine   . Rectocele  Past Surgical History:  Procedure Laterality Date  . Anterior and posterior vaginal repair    . Bladder neck reconstruction    . Colon surgery  2012   Anterior and posterior repair  . Tubal ligation      Social History   Substance and Sexual Activity  Alcohol Use Never   Tobacco Use History[1] E-Cigarettes  . Vaping Use Never User   . Start Date    . Cartridges/Day    . Quit Date     Social History   Substance and Sexual Activity  Drug Use Never         Allergies[2]  Home Medications   ACCU-CHEK SOFTCLIX LANCETS LANCETS    Use  to monitor blood glucose 2 time(s) daily.   AMBULATORY COMPOUNDED MEDICATION    Vaginal valium  5mg  compounded suppository Administer vaginally three times a day as needed for pelvic pain   ASPIRIN-ACETAMINOPHEN -CAFFEINE (EXCEDRIN MIGRAINE) 250-250-65 MG PER TABLET    Take two tablets by mouth every 6 (six) hours as needed.   BETAHISTINE HCL POWD    24 mg by Does not apply route 2 (two) times daily.   BLACK ELDERBERRY (SAMBUCUS ELDERBERRY) 50 MG/5ML SYRP    Take by mouth as needed.   BLOOD GLUCOSE MONITORING SUPPL (ACCU-CHEK GUIDE) W/DEVICE KIT    Use to check blood sugars up to twice daily.   CETIRIZINE (ZYRTEC) 10 MG TABLET    Take one tablet (10 mg dose) by mouth as needed.   CLOBETASOL (TEMOVATE) 0.05 % OINTMENT    Apply topically as needed.   CLOBETASOL PROPIONATE 0.025 % CREA    Apply topically as needed.   CRANBERRY 200 MG CAPS    Take 1 capsule by mouth 2 (two) times daily.   CYCLOBENZAPRINE (FLEXERIL) 10 MG TABLET    Take one tablet (10 mg dose) by mouth with meals as needed.   D-MANNOSE 500 MG CAPS    Take 1 capsule by mouth daily.   DIAZEPAM  (VALIUM ) 5 MG TABLET    Take one tablet (5 mg dose) by mouth daily as needed. Max Daily Amount: 5 mg   DIAZEPAM  (VALIUM ) 5 MG TABLET    Place two tablets (10 mg dose) vaginally every 8 (eight) hours as needed.   DIPHENHYDRAMINE  (BANOPHEN ) 25 MG TABLET    Take one half tablet (12.5 mg dose) by mouth at bedtime as needed for Sleep.   ESTRADIOL  (ESTRACE ) 0.1 MG/GRAM VAGINAL CREAM    Place one half g vaginally daily for 14 days, THEN one half g 3 (three) times a week.   FLUTICASONE PROPIONATE (FLONASE) 50 MCG/ACTUATION NASAL SPRAY    two sprays by Nasal route.   GLUCOSAMINE SULFATE 750 MG TABS    Take 2 tablets by mouth daily.   GLUCOSE BLOOD (ACCU-CHEK GUIDE) TEST STRIP    Use to monitor blood glucose 2 time(s) daily   HYDROCORTISONE (ANUSOL-HC,PROCTOSOL,PROCTOZONE,PROCTO-MED) 2.5% RECTAL CREAM    Place rectally 2 (two) times a day as needed.    HYDROCORTISONE 2.5 % CREAM    Place one Application rectally.   LINAGLIPTIN  (TRADJENTA ) 5 MG TABLET    Take one tablet (5 mg dose) by mouth daily.   LORATADINE (CLARITIN) 10 MG TABLET    Take one tablet (10 mg dose) by mouth daily.   MAGNESIUM  OXIDE 400 TABS    Take 500 mg by mouth daily.   MELATONIN 10 MG CAPS    Take one capsule (10 mg dose) by mouth at bedtime.  METAXALONE (SKELAXIN) 400 MG TABLET    Take by mouth as needed.   TERCONAZOLE (TERAZOL 7) 0.4% VAGINAL CREAM    Place one applicator vaginally.   TRAMADOL  (ULTRAM ) 50 MG TABLET    Take one tablet (50 mg dose) by mouth as needed.   TRIAMTERENE -HYDROCHLOROTHIAZIDE  (DYAZIDE ) 37.5-25 MG PER CAPSULE    TAKE 1 CAPSULE BY MOUTH DAILY    Primary Survey  Primary Survey  Review of Systems   Review of Systems  Constitutional: Negative.   HENT:  Positive for ear pain and tinnitus.   Eyes:  Positive for photophobia and pain.  Respiratory: Negative.    Cardiovascular: Negative.   Gastrointestinal: Negative.   Genitourinary:  Positive for difficulty urinating.  Musculoskeletal: Negative.   Skin: Negative.   Neurological:  Positive for headaches.       Confused/altered mental status  All other systems reviewed and are negative.   Physical Exam   ED Triage Vitals [03/13/24 1725]  BP 124/90  Heart Rate 92  Resp 20  SpO2 97 %  Temp 97.5 F (36.4 C)    Physical Exam  Vitals reviewed. Constitutional: She appears well-developed and well-nourished. She is in good hygiene. She has good hygiene. She appears to be in pain, does not appear distressed, does not appear ill and no respiratory distress. Not diaphoretic. HENT:  Head: Normocephalic and atraumatic.  Right Ear: Normal external ear. Normal ear canal and tympanic membrane normal. No mastoid tenderness. Decreased hearing is noted. no pain with traction of external ear.  Left Ear:  Canal no cerumen impaction. No drainage or swelling. There is mastoid tenderness. Tympanic  membrane is erythematous. Tympanic membrane is not perforated. Decreased hearing is noted. There is pain with traction of external ear. No canal laceration.  Mouth/Throat: Voice normal.  There is a small scab and mild erythema behind the left ear with associated tenderness.  There is no purulence or drainage.  There is no significant erythema or fluctuance surrounding the implanted cochlear device. No proptosis. The left TM is erythematous.  Eyes: EOM are intact. Conjunctivae are normal. Pupils are equal, round, and reactive to light. Photophobia.  IOP left eye IOP right eye 22 mmHg  No pain with EOMs.  No periorbital swelling or erythema  Neck: Normal range of motion and voice normal. Neck supple. No nuchal rigidity.  Cardiovascular: Normal rate, regular rhythm, normal heart sounds and intact distal pulses.  Pulmonary/Chest: No respiratory distress. Good air movement. Not tachypneic. Respiratory effort normal and breath sounds normal.  Abdominal: Soft. There is no abdominal tenderness. There is no guarding. Abdomen not distended.  Musculoskeletal: Normal range of motion.     Cervical back: Normal range of motion and neck supple.   Neurological: She is alert and oriented to person, place, and time. Moves all extremities equally. She has normal speech. Finger to nose intact bilaterally. Sensation intact to light touch, bilateral upper and lower extremities. Heel to shin intact bilaterally. Strength 5/5 bilateral upper and lower extremities. She displays normal visual fields. No facial weakness.no facial droop.  Skin: Skin is warm. Not diaphoretic. Skin is dry.  Psychiatric:  Patient with intermittent bursts of random words and speech.  She tries to calm herself down and then becomes more quiet and focused, however returns to tangential speech.     ED Course   Lab results:   CBC AND DIFFERENTIAL - Abnormal      Result Value   WBC 7.5     RBC 4.52  HGB 14.3     HCT 42.8     MCV  94.7     MCH 31.6     MCHC 33.4     Plt Ct 345     RDW SD 44.3     MPV 8.8 (*)    NRBC% 0.0     Absolute NRBC Count 0.00     NEUTROPHIL % 63.8     LYMPHOCYTE % 27.5     MONOCYTE % 7.0     Eosinophil % 0.9     BASOPHIL % 0.7     IG% 0.1     ABSOLUTE NEUTROPHIL COUNT 4.80     ABSOLUTE LYMPHOCYTE COUNT 2.07     Absolute Monocyte Count 0.53     Absolute Eosinophil Count 0.07     Absolute Basophil Count 0.05     Absolute Immature Granulocyte Count 0.01    COMPREHENSIVE METABOLIC PANEL - Abnormal   Na 134 (*)    Potassium 4.2     Cl 98     CO2 24     AGAP 12     Glucose 219 (*)    BUN 10     Creatinine 0.72     Ca 9.7     ALK PHOS 85     T Bili 0.4     Total Protein 7.5     Alb 4.7     GLOBULIN 2.8     ALBUMIN/GLOBULIN RATIO 1.7     BUN/CREAT RATIO 13.9     ALT 24     AST 24     eGFR 97     Comment: Normal GFR (glomerular filtration rate) > 60 mL/min/1.73 meters squared, < 60 may include impaired kidney function. Calculation based on the Chronic Kidney Disease Epidemiology Collaboration (CK-EPI)equation refit without adjustment for race.  URINALYSIS W/MICRO REFLEX CULTURE - SYMPTOMATIC - Abnormal   Urine Color Yellow     Urine Clarity Cloudy (*)    Urine Specific Gravity 1.004 (*)    Urine pH 6.0     Urine Protein - Dipstick Negative     Urine Glucose 1000 (*)    Urine Ketones Negative     Urine Bilirubin Negative     Urine Blood Negative     Urine Nitrite Negative     Urine Urobilinogen <2     Urine Leukocyte Esterase 25 (*)    Urine Squamous Epithelial Cells 3-4 (*)    Urine WBC 3-5 (*)    Urine Bacteria Trace     UA Microscopic Yes Micro (*)    Narrative:    Does not meet criteria for reflex to Urine Culture.  MAGNESIUM  - Normal   Mg 2.4    CULTURE, URINE  URINE DRUGS OF ABUSE SCRN   Ur PH DOA Scr 6.0     Amphet Scr Negative     Barb Scr Negative     Benzo Scr       Comment: Unable to report due to possible interfering substances. Consider follow-up  confirmation testing through [facility]         Cannab Scr Negative     Cocaine Scr Negative     Opiates Scr Negative     Meth Scr Negative     Oxyco Scr Negative     Fentanyl  Scr Negative     Buprenorphine Screen Negative     Narrative:    Please Note Detection Levels Below:  Amphetamines                    1000 ng/mL  Barbiturates                    200 ng/mL  Benzodiazepines                 200 ng/mL  Cannabinoids (Marijuana, THC)   50 ng/mL  Cocaine                         300 ng/mL  Opiates                         300 ng/mL  Methadone                       300 ng/mL  Oxycodone                        100 ng/mL  Fentanyl                           5 ng/mL  Buprenorphine                     5 ng/mL  This test is a screening test and results are only to be used for medical purposes.  If confirmation of positive results are needed, please order confirmation by GC/MS for each drug that needs confirmation.  Urine specimens are retained for 5 days.   POCT GLUCOSE  GOLD SST    Imaging:   XR CHEST AP PORTABLE   Narrative:    XR CHEST 1 VIEW  HISTORY:   Acute mental status change  COMPARISON:  01/07/2024    TECHNIQUE:  Chest, 1 view  AP portable  .  FINDINGS:  Monitoring leads project over the chest. Cardiomediastinal silhouette is normal. Lungs are grossly clear. No pleural effusions. No pneumothorax . No acute osseous abnormality.    Impression:    IMPRESSION:    1. No definite acute process.  Electronically Signed by: Harrie Copes on 03/13/2024 7:52 PM  CT HEAD WO CONTRAST   Narrative:    CT HEAD WITHOUT CONTRAST   TECHNIQUE: Noncontrast axial images of the head obtained. Multiplanar reconstructions were obtained and reviewed .Dose reduction was utilized (automated exposure control, mA or kV adjustment based on patient size, or iterative image reconstruction).  INDICATION: Altered Mental Status   COMPARISON:  None   FINDINGS: There is streak artifact emanating from cochlear implant. This appears portions of left cerebral hemisphere. Grossly there is no intracranial hemorrhage, edema, mass effect or midline shift. Ventricular size is appropriate for brain volume. . No acute calvarial abnormality.  Patient appears status post recent mastoidectomy and placement of a cochlear implant. There is fluid opacifying the area of mastoidectomy and remaining mastoid air cells as well as majority opacification of the left middle ear. Findings raise concern for mastoiditis and otitis.     Impression:    IMPRESSION:  Artifact from cochlear implant obscures portions of left cerebral hemisphere.  Postsurgical changes. Fluid opacifies left mastoids and middle ear. This raises concern for mastoiditis/otitis.  Electronically Signed by: Sonny Livings, MD on 03/13/2024 9:14 PM  CT TEMPORAL BONE WO CONTRAST   Narrative:    EXAM: CT TEMPORAL BONE WO CONTRAST  INDICATION: left ear pain, recent cochlear implant,  mastoiditis  TECHNIQUE: CT imaging of the temporal bones. Thin section axial images and multiplanar reconstructions were obtained and reviewed. Dose reduction was utilized (automated exposure control, mA or kV adjustment based on patient size, or iterative image reconstruction).  COMPARISON: none   FINDINGS:  Examination performed on an emergency basis to evaluate for mastoiditis. There is been left mastoidectomy and placement of a cochlear implant. There is fluid within the mastoid defect as well is fluid throughout remaining mastoid air cells. There is also majority opacification of the middle ear.    Impression:    IMPRESSION:  Postsurgical changes. Left mastoid and middle ear effusion. This raises concern for mastoiditis/otitis.  Electronically Signed by: Sonny Livings, MD on 03/13/2024 9:10 PM     ECG: ECG Results          ECG 12 lead (In process)      Collection Time Result Time  Acquisition Device Ventricular Rate Atrial Rate P-R Interval QRS Duration Q-T Interval QTC Calculation(Bazett) Calculated P Axis Calculated R Axis Calculated T Axis   03/13/24 18:20:06 03/13/24 18:39:40 D3K 72 72 158 88 398 435 48 46 32         Collection Time Result Time ECGDiag   03/13/24 18:20:06 03/13/24 18:39:40 Normal sinus rhythm Normal ECG When compared with ECG of 07-Jan-2024 02:55, Nonspecific T wave abnormality has replaced inverted T waves in Inferior leads Nonspecific T wave abnormality no longer evident in Lateral leads QT has shortened                                                                                          Pre-Sedation Procedures    Medical Decision Making Patient with multiple concerns.  Having altered mental status with tangential and rapid speech.  Also with left ear pain and tinnitus after recent cochlear implant.  Does have extensive history of hearing loss and tinnitus, follow-up with ENT.  Also reporting urinary retention.  She did have initial bladder scan showing approximately 300 mL per nursing.  She did have anesthesia although it was a week ago also reports pelvic floor surgery that contributes to some urine retention.  She was able to urinate and postvoid residual was approximately 90s mL per nursing.  Vital signs are stable and afebrile.  There was some mild erythema behind the left ear and on the left TM membrane.  CT temporal bone was ordered to further assess.  Also CT head with altered mental status.  She is fully neurologic intact.  Afebrile and no nuchal rigidity.  Will also check labs, urinalysis, UDS, EKG, CXR as part of altered mental status workup.  Patient also reported left eye pain.  I suspect this is more likely referred from the ear.  All the pain seems to start behind the left ear and radiate towards the left forehead and not necessarily in the left eye itself.  IOP's were checked  which were overall unremarkable.  Was very slightly elevated in the right eye but she has no pain or symptoms in this eye.  There is no significant conjunctival injection or pain with EOMs.  Labs are overall unremarkable and reassuring.  Urinalysis does not look definitely infected.  Urine culture was sent.  UDS is unremarkable.  Noted to be hyperglycemic but normal anion gap.  No leukocytosis.  Chest x-ray shows no acute process.  EKG is normal sinus rhythm.  CT of the head shows artifact from the cochlear implant which obscures portions of left cerebral hemisphere.  Postsurgical changes.  Fluid opacifies left mastoids and middle ear.  This raises concern for mastoiditis/otitis.  Similar findings on CT of the temporal bone.  Ordered IV vancomycin and cefepime, given recent antibiotic use, for mastoiditis.  Possible this is contributing to patient's symptoms.  10:10PM I discussed with Dr. Tinnie Emerald with [facility] regarding findings of mastoiditis after recent cochlear implant among other symptoms.  Dr. Emerald was able to review imaging and states she suspects this is more likely normal postop changes and is not concerning for infection, especially with no fever and no leukocytosis.  There is no proptosis on exam, no purulence around the ear or fluctuance around the implant, she agrees this is all reassuring.  She agrees thinks it is reasonable to medicine admission for encephalopathy and can treat with IV antibiotics to cover mastoiditis although seems less likely and follow-up with her ENT doctor outpatient after admission and further workup for encephalopathy.  Patient does have tangential speech and altered from her normal baseline.  Think medicine admission for encephalopathy is reasonable.  She has reportedly seen psych for this in the past and been cleared.   10:20PM Discussed with hospitalist Dr. Suzy who agrees to see patient in consultation for admission.  Recommends starting meningitis  treatment.  I did add ampicillin given patient's age greater than 50 and acyclovir. Hospitalist states he will decide if steroids are necessary. Discussed results and plan with patient and significant other who are in agreement.  Upon reevaluation, significant other states patient's symptoms seem to have completely resolved and she is much more at baseline.  He wonders if there is postop anesthesia contributing to symptoms.  Patient has also been able to successfully urinate again.  Do not think she needs catheter at this time.  Vital signs have remained stable.  Amount and/or Complexity of Data Reviewed External Data Reviewed: notes. Labs: ordered. Radiology: ordered. ECG/medicine tests: ordered.  Risk Prescription drug management. Decision regarding hospitalization.          Provider Communication  New Prescriptions   No medications on file    Modified Medications   No medications on file    Discontinued Medications   No medications on file    Clinical Impression Final diagnoses:  Encephalopathy acute  Mastoiditis of left side  Tinnitus of left ear  Hyperglycemia    ED Disposition     ED Disposition  Admit   Condition  --   Comment  Telemetry Required?: No                    Electronically signed by:         [1] Social History Tobacco Use  Smoking Status Never  . Passive exposure: Never  Smokeless Tobacco Never  [2] Allergies Allergen Reactions  . Apple Rash and Swelling    Mouth and tongue swelling  . Skin Adhesives Itching    takes off skin  . Darvon [Propoxyphene] Rash  . Hydrocodone -Acetaminophen  Rash  . Oxycodone -Acetaminophen  Rash  . Pineapple Rash  . Sulfa Antibiotics Rash   Ozell KATHEE Birmingham, PA-C 03/13/24 2314

## 2024-03-13 NOTE — H&P (Signed)
 Created in error

## 2024-03-14 NOTE — Progress Notes (Signed)
 Patient is a 58 year old female, with PMH of Non-Insulin Dependent DM type 2, Essential HTN, HLD and Pelvic Pain Syndrome, who presents to the hospital for altered mentals status.    # AMS- Symptoms of pressured speech , excessive laughing, excessive crying, and brief episode of catatonia is concerning for undiagnosed BH issue such a Bipolar Disorder.  CT head was negative for intracranial bleeding. UDS was negative  Although CT head raised concerns for mastoiditis, ENT evaluated imaging did not believe mastoiditis was likely and findings was were likely post surgical in nature per ED report.  Discussed with husband and patient the risk/benefits of BH consult, inpatient observation for  [facility] consult/MRI head, or discharged home with follow up with PCP.  A shared decision was made to discharge home with PCP follow up. Patient is not suicidal.  Husband was educated on signs and symptoms of stroke and to follow up with PCP in the next 7 days.  CC- AMS  Patient is a 58 year old female, with PMH of Non-Insulin Dependent DM type 2, Essential HTN, HLD and Pelvic Pain Syndrome, who presents to the hospital for altered mentals status.   Patient was poor historian and history was given by her husband at bed side. Patient has had symptoms of excessive crying, excessive laughing, and pressured speech for the past 5-6 days. Symptoms will waxe and wane. At times, patient will talk nonsensical . Also, patient did have an episode of whole body paralysis that self resolved. Due to the persistent of symptoms, husband sought medical care.  Personally Reviewed Dr. Orland ER Note on 01-06-2024 for AMS  Personally Reviewed CT Head- No acute intracranial bleeding, masses, or midline shift  PMH- Non-Insulin Dependent DM type 2, Essential HTN, HLD and Pelvic Pain Syndrome,  PSH- Bladder Reconstruction Surgery, Tubal Ligation, Cochlear Implant  Allergies- Darvon- Rash, Norco-Rash, Percocet-Rash, Sulfa-Rash    Meds-  Tradjenta  5 mg daily, Maxide 37.5-25 mg daily, Vaginal Valium    Social- No drug, or alcohol use  FH- Heart Disease- Mother, and Brother, HTN-Mother, and Father,  Colon Cancer- Grandfather  ROS Unable to obtain due to hard of hearing    PE BP 115/70   Pulse 82   Temp 97.8 F (36.6 C)   Resp 17   Wt 77.1 kg (170 lb)   SpO2 95%   BMI 28.29 kg/m   General- well appearing  female, under no distress Head- Atraumatic and Normocephalic  Eye- EOMI, No erythema or discharge  Neck- No masses tenderness, or lymphadenopathy Heart- S1 and S2 present. No murmurs,  Lungs- CTAB. No wheezes, rales, or rhonchi Chest- No use of accessory respiratory muscles Abdomen- Bowel sounds present. Soft- non-tender, non-distended Neuro- CN-2-12 intact, no gross neurological defect MSK-No Deformity, Injury or LE Edema.  Skin- no bruises, rashes, or lesions   Labs  Recent Results (from the past 24 hours)  CBC And Differential   Collection Time: 03/13/24  6:02 PM  Result Value Ref Range   WBC 7.5 4.0 - 10.5 thou/mcL   RBC 4.52 3.93 - 5.22 million/mcL   HGB 14.3 11.2 - 15.7 gm/dL   HCT 57.1 65.8 - 55.0 %   MCV 94.7 79.4 - 94.8 fL   MCH 31.6 25.6 - 32.2 pg   MCHC 33.4 32.2 - 35.5 gm/dL   Plt Ct 654 849 - 599 thou/mcL   RDW SD 44.3 35.1 - 46.3 fL   MPV 8.8 (L) 9.4 - 12.4 fL   NRBC% 0.0 0.0 - 0.2 /  100WBC   Absolute NRBC Count 0.00 0.00 - 0.01 thou/mcL   NEUTROPHIL % 63.8 %   LYMPHOCYTE % 27.5 %   MONOCYTE % 7.0 %   Eosinophil % 0.9 %   BASOPHIL % 0.7 %   IG% 0.1 %   ABSOLUTE NEUTROPHIL COUNT 4.80 1.50 - 7.50 thou/mcL   ABSOLUTE LYMPHOCYTE COUNT 2.07 1.00 - 4.50 thou/mcL   Absolute Monocyte Count 0.53 0.10 - 0.80 thou/mcL   Absolute Eosinophil Count 0.07 0.00 - 0.50 thou/mcL   Absolute Basophil Count 0.05 0.00 - 0.20 thou/mcL   Absolute Immature Granulocyte Count 0.01 0.00 - 0.03 thou/mcL   Comprehensive metabolic panel   Collection Time: 03/13/24  6:02 PM  Result Value Ref Range   Na 134  (L) 136 - 146 mmol/L   Potassium 4.2 3.7 - 5.4 mmol/L   Cl 98 97 - 108 mmol/L   CO2 24 20 - 32 mmol/L   AGAP 12 7 - 16 mmol/L   Glucose 219 (H) 65 - 99 mg/dL   BUN 10 6 - 24 mg/dL   Creatinine 9.27 9.42 - 1.00 mg/dL   Ca 9.7 8.7 - 89.7 mg/dL   ALK PHOS 85 25 - 849 U/L   T Bili 0.4 0.0 - 1.2 mg/dL   Total Protein 7.5 6.0 - 8.5 gm/dL   Alb 4.7 3.5 - 5.5 gm/dL   GLOBULIN 2.8 1.5 - 4.5 gm/dL   ALBUMIN/GLOBULIN RATIO 1.7 1.1 - 2.5   BUN/CREAT RATIO 13.9 11.0 - 26.0   ALT 24 0 - 40 U/L   AST 24 0 - 40 U/L   eGFR 97 >=60 mL/min/1.33m2  Magnesium    Collection Time: 03/13/24  6:02 PM  Result Value Ref Range   Mg 2.4 1.6 - 2.6 mg/dL  ECG 12 lead   Collection Time: 03/13/24  6:20 PM  Result Value Ref Range   Acquisition Device D3K    Ventricular Rate 72 BPM   Atrial Rate 72 BPM   P-R Interval 158 ms   QRS Duration 88 ms   Q-T Interval 398 ms   QTC Calculation(Bazett) 435 ms   Calculated P Axis 48 degrees   Calculated R Axis 46 degrees   Calculated T Axis 32 degrees   ECG Diagnosis      Normal sinus rhythm Normal ECG When compared with ECG of 07-Jan-2024 02:55, Nonspecific T wave abnormality has replaced inverted T waves in Inferior leads Nonspecific T wave abnormality no longer evident in Lateral leads QT has shortened   Urinalysis w/ Reflex Microscopic; Reflex to Culture - Symptomatic   Collection Time: 03/13/24  7:21 PM  Result Value Ref Range   Urine Color Yellow Yellow    Urine Clarity Cloudy (A) Clear   Urine Specific Gravity 1.004 (L) 1.005 - 1.030   Urine pH 6.0 5 to 9   Urine Protein - Dipstick Negative Negative mg/dl   Urine Glucose 8999 (A) Negative mg/dL   Urine Ketones Negative Negative mg/dl   Urine Bilirubin Negative Negative mg/dL   Urine Blood Negative Negative mg/dL   Urine Nitrite Negative Negative   Urine Urobilinogen <2 <2 mg/dl   Urine Leukocyte Esterase 25 (A) Negative Leu/mcL   Urine Squamous Epithelial Cells 3-4 (A) 0 - 2 /HPF   Urine WBC 3-5  (A) 0 - 2 /hpf   Urine Bacteria Trace None /HPF   UA Microscopic Yes Micro (A) No Micro  Urine Drug Screen   Collection Time: 03/13/24  7:21 PM  Result Value  Ref Range   Ur PH DOA Scr 6.0 4.5 - 9.0   Amphet Scr Negative Negative   Barb Scr Negative Negative   Benzo Scr     Cannab Scr Negative Negative   Cocaine Scr Negative Negative   Opiates Scr Negative Negative   Meth Scr Negative Negative   Oxyco Scr Negative Negative   Fentanyl  Scr Negative Negative   Buprenorphine Screen Negative Negative      Imaging CT Head WO Contrast Result Date: 03/13/2024 CT HEAD WITHOUT CONTRAST TECHNIQUE: Noncontrast axial images of the head obtained. Multiplanar reconstructions were obtained and reviewed .Dose reduction was utilized (automated exposure control, mA or kV adjustment based on patient size, or iterative image reconstruction). INDICATION: Altered Mental Status  COMPARISON: None FINDINGS: There is streak artifact emanating from cochlear implant. This appears portions of left cerebral hemisphere. Grossly there is no intracranial hemorrhage, edema, mass effect or midline shift. Ventricular size is appropriate for brain volume. . No acute calvarial abnormality. Patient appears status post recent mastoidectomy and placement of a cochlear implant. There is fluid opacifying the area of mastoidectomy and remaining mastoid air cells as well as majority opacification of the left middle ear. Findings raise concern for mastoiditis and otitis.   IMPRESSION:  Artifact from cochlear implant obscures portions of left cerebral hemisphere. Postsurgical changes. Fluid opacifies left mastoids and middle ear. This raises concern for mastoiditis/otitis. Electronically Signed by: Sonny Livings, MD on 03/13/2024 9:14 PM  XR Chest Ap Portable Result Date: 03/13/2024 XR CHEST 1 VIEW HISTORY:   Acute mental status change COMPARISON:  01/07/2024  TECHNIQUE:  Chest, 1 view  AP portable  . FINDINGS: Monitoring leads  project over the chest. Cardiomediastinal silhouette is normal. Lungs are grossly clear. No pleural effusions. No pneumothorax . No acute osseous abnormality.   IMPRESSION:  1. No definite acute process. Electronically Signed by: Harrie Copes on 03/13/2024 7:52 PM  CT Temporal Bone WO Contrast Result Date: 03/13/2024 EXAM: CT TEMPORAL BONE WO CONTRAST INDICATION: left ear pain, recent cochlear implant, mastoiditis TECHNIQUE: CT imaging of the temporal bones. Thin section axial images and multiplanar reconstructions were obtained and reviewed. Dose reduction was utilized (automated exposure control, mA or kV adjustment based on patient size, or iterative image reconstruction). COMPARISON: none FINDINGS: Examination performed on an emergency basis to evaluate for mastoiditis. There is been left mastoidectomy and placement of a cochlear implant. There is fluid within the mastoid defect as well is fluid throughout remaining mastoid air cells. There is also majority opacification of the middle ear.   IMPRESSION: Postsurgical changes. Left mastoid and middle ear effusion. This raises concern for mastoiditis/otitis. Electronically Signed by: Sonny Livings, MD on 03/13/2024 9:10 PM

## 2024-03-15 NOTE — Telephone Encounter (Signed)
 Called pt's husband to check on her after recent ED visit.   She was doing well until midnight last night. Now confused but no meningitic symptoms or concerns for post op wound infection. Had similar delayed confusion after last surgery. Husband trying to decide if he wants to bring her back to the ED. Gave return precautions and let him know we are happy to take a look at her ear if they come to [facility]. All questions answered.

## 2024-03-17 NOTE — Progress Notes (Signed)
 Please call this patient. Her urine culture was positive for bacteria. I have sent a prescription for amoxicillin  to her pharmacy.   Therisa KANDICE Silvan, MD 03/17/24 1000

## 2024-03-20 ENCOUNTER — Other Ambulatory Visit: Payer: Self-pay

## 2024-03-20 ENCOUNTER — Encounter: Payer: Self-pay | Admitting: Psychiatry

## 2024-03-20 ENCOUNTER — Ambulatory Visit (HOSPITAL_COMMUNITY)
Admission: EM | Admit: 2024-03-20 | Discharge: 2024-03-20 | Disposition: A | Discharge status: Other Acute Inpatient Hospital

## 2024-03-20 ENCOUNTER — Inpatient Hospital Stay
Admission: AD | Admit: 2024-03-20 | Discharge: 2024-03-25 | DRG: 885 | Disposition: A | Discharge status: Home / Self Care | Source: Intra-hospital | Attending: Psychiatry | Admitting: Psychiatry

## 2024-03-20 DIAGNOSIS — R739 Hyperglycemia, unspecified: Secondary | ICD-10-CM | POA: Insufficient documentation

## 2024-03-20 DIAGNOSIS — R4182 Altered mental status, unspecified: Secondary | ICD-10-CM | POA: Diagnosis not present

## 2024-03-20 DIAGNOSIS — F1994 Other psychoactive substance use, unspecified with psychoactive substance-induced mood disorder: Secondary | ICD-10-CM

## 2024-03-20 DIAGNOSIS — F13931 Sedative, hypnotic or anxiolytic use, unspecified with withdrawal delirium: Secondary | ICD-10-CM

## 2024-03-20 DIAGNOSIS — G0490 Encephalitis and encephalomyelitis, unspecified: Secondary | ICD-10-CM

## 2024-03-20 DIAGNOSIS — Z823 Family history of stroke: Secondary | ICD-10-CM

## 2024-03-20 DIAGNOSIS — F13231 Sedative, hypnotic or anxiolytic dependence with withdrawal delirium: Secondary | ICD-10-CM | POA: Insufficient documentation

## 2024-03-20 DIAGNOSIS — R410 Disorientation, unspecified: Secondary | ICD-10-CM | POA: Diagnosis not present

## 2024-03-20 DIAGNOSIS — Z885 Allergy status to narcotic agent status: Secondary | ICD-10-CM

## 2024-03-20 DIAGNOSIS — R338 Other retention of urine: Secondary | ICD-10-CM | POA: Diagnosis not present

## 2024-03-20 DIAGNOSIS — F29 Unspecified psychosis not due to a substance or known physiological condition: Principal | ICD-10-CM | POA: Diagnosis present

## 2024-03-20 DIAGNOSIS — Z79899 Other long term (current) drug therapy: Secondary | ICD-10-CM

## 2024-03-20 DIAGNOSIS — E785 Hyperlipidemia, unspecified: Secondary | ICD-10-CM | POA: Diagnosis present

## 2024-03-20 DIAGNOSIS — F22 Delusional disorders: Secondary | ICD-10-CM | POA: Insufficient documentation

## 2024-03-20 DIAGNOSIS — R569 Unspecified convulsions: Secondary | ICD-10-CM | POA: Diagnosis not present

## 2024-03-20 DIAGNOSIS — Z882 Allergy status to sulfonamides status: Secondary | ICD-10-CM

## 2024-03-20 DIAGNOSIS — I10 Essential (primary) hypertension: Secondary | ICD-10-CM | POA: Insufficient documentation

## 2024-03-20 DIAGNOSIS — F451 Undifferentiated somatoform disorder: Secondary | ICD-10-CM | POA: Diagnosis present

## 2024-03-20 DIAGNOSIS — Z85828 Personal history of other malignant neoplasm of skin: Secondary | ICD-10-CM

## 2024-03-20 DIAGNOSIS — E119 Type 2 diabetes mellitus without complications: Secondary | ICD-10-CM | POA: Diagnosis present

## 2024-03-20 DIAGNOSIS — N390 Urinary tract infection, site not specified: Secondary | ICD-10-CM

## 2024-03-20 DIAGNOSIS — H9190 Unspecified hearing loss, unspecified ear: Secondary | ICD-10-CM | POA: Diagnosis present

## 2024-03-20 DIAGNOSIS — F459 Somatoform disorder, unspecified: Secondary | ICD-10-CM | POA: Diagnosis present

## 2024-03-20 DIAGNOSIS — H9319 Tinnitus, unspecified ear: Secondary | ICD-10-CM

## 2024-03-20 DIAGNOSIS — Z83438 Family history of other disorder of lipoprotein metabolism and other lipidemia: Secondary | ICD-10-CM

## 2024-03-20 DIAGNOSIS — F13959 Sedative, hypnotic or anxiolytic use, unspecified with sedative, hypnotic or anxiolytic-induced psychotic disorder, unspecified: Secondary | ICD-10-CM

## 2024-03-20 DIAGNOSIS — Z7984 Long term (current) use of oral hypoglycemic drugs: Secondary | ICD-10-CM

## 2024-03-20 DIAGNOSIS — F419 Anxiety disorder, unspecified: Secondary | ICD-10-CM | POA: Diagnosis present

## 2024-03-20 DIAGNOSIS — E876 Hypokalemia: Secondary | ICD-10-CM | POA: Insufficient documentation

## 2024-03-20 DIAGNOSIS — Z8249 Family history of ischemic heart disease and other diseases of the circulatory system: Secondary | ICD-10-CM

## 2024-03-20 DIAGNOSIS — R339 Retention of urine, unspecified: Secondary | ICD-10-CM

## 2024-03-20 DIAGNOSIS — Z91199 Patient's noncompliance with other medical treatment and regimen due to unspecified reason: Secondary | ICD-10-CM | POA: Insufficient documentation

## 2024-03-20 DIAGNOSIS — F309 Manic episode, unspecified: Secondary | ICD-10-CM | POA: Insufficient documentation

## 2024-03-20 DIAGNOSIS — Z888 Allergy status to other drugs, medicaments and biological substances status: Secondary | ICD-10-CM

## 2024-03-20 DIAGNOSIS — Z91048 Other nonmedicinal substance allergy status: Secondary | ICD-10-CM

## 2024-03-20 DIAGNOSIS — Z62810 Personal history of physical and sexual abuse in childhood: Secondary | ICD-10-CM

## 2024-03-20 DIAGNOSIS — Z833 Family history of diabetes mellitus: Secondary | ICD-10-CM

## 2024-03-20 DIAGNOSIS — H7092 Unspecified mastoiditis, left ear: Secondary | ICD-10-CM | POA: Insufficient documentation

## 2024-03-20 LAB — CBC WITH DIFFERENTIAL/PLATELET
Abs Immature Granulocytes: 0.01 K/uL (ref 0.00–0.07)
Basophils Absolute: 0 K/uL (ref 0.0–0.1)
Basophils Relative: 0 %
Eosinophils Absolute: 0 K/uL (ref 0.0–0.5)
Eosinophils Relative: 0 %
HCT: 43.3 % (ref 36.0–46.0)
Hemoglobin: 14.8 g/dL (ref 12.0–15.0)
Immature Granulocytes: 0 %
Lymphocytes Relative: 19 %
Lymphs Abs: 1.3 K/uL (ref 0.7–4.0)
MCH: 31.6 pg (ref 26.0–34.0)
MCHC: 34.2 g/dL (ref 30.0–36.0)
MCV: 92.3 fL (ref 80.0–100.0)
Monocytes Absolute: 0.4 K/uL (ref 0.1–1.0)
Monocytes Relative: 6 %
Neutro Abs: 5.1 K/uL (ref 1.7–7.7)
Neutrophils Relative %: 75 %
Platelets: 380 K/uL (ref 150–400)
RBC: 4.69 MIL/uL (ref 3.87–5.11)
RDW: 13.1 % (ref 11.5–15.5)
WBC: 6.9 K/uL (ref 4.0–10.5)
nRBC: 0 % (ref 0.0–0.2)

## 2024-03-20 LAB — COMPREHENSIVE METABOLIC PANEL WITH GFR
ALT: 29 U/L (ref 0–44)
AST: 28 U/L (ref 15–41)
Albumin: 4.7 g/dL (ref 3.5–5.0)
Alkaline Phosphatase: 64 U/L (ref 38–126)
Anion gap: 12 (ref 5–15)
BUN: 6 mg/dL (ref 6–20)
CO2: 25 mmol/L (ref 22–32)
Calcium: 9.5 mg/dL (ref 8.9–10.3)
Chloride: 103 mmol/L (ref 98–111)
Creatinine, Ser: 0.75 mg/dL (ref 0.44–1.00)
GFR, Estimated: >60 mL/min (ref >60)
Glucose, Bld: 201 mg/dL — ABNORMAL HIGH (ref 70–99)
Potassium: 3.3 mmol/L — ABNORMAL LOW (ref 3.5–5.1)
Sodium: 140 mmol/L (ref 135–145)
Total Bilirubin: 0.9 mg/dL (ref 0.0–1.2)
Total Protein: 8.3 g/dL — ABNORMAL HIGH (ref 6.5–8.1)

## 2024-03-20 LAB — RESP PANEL BY RT-PCR (RSV, FLU A&B, COVID)  RVPGX2
Influenza A by PCR: NEGATIVE
Influenza B by PCR: NEGATIVE
Resp Syncytial Virus by PCR: NEGATIVE
SARS Coronavirus 2 by RT PCR: NEGATIVE

## 2024-03-20 LAB — URINALYSIS, ROUTINE W REFLEX MICROSCOPIC
Bilirubin Urine: NEGATIVE
Glucose, UA: NEGATIVE mg/dL
Hgb urine dipstick: NEGATIVE
Ketones, ur: 5 mg/dL — AB
Leukocytes,Ua: NEGATIVE
Nitrite: NEGATIVE
Protein, ur: NEGATIVE mg/dL
Specific Gravity, Urine: 1.004 — ABNORMAL LOW (ref 1.005–1.030)
pH: 6 (ref 5.0–8.0)

## 2024-03-20 LAB — POCT URINE DRUG SCREEN - MANUAL ENTRY (I-SCREEN)
POC Amphetamine UR: NOT DETECTED
POC Buprenorphine (BUP): NOT DETECTED
POC Cocaine UR: NOT DETECTED
POC Marijuana UR: NOT DETECTED
POC Methadone UR: NOT DETECTED
POC Methamphetamine UR: NOT DETECTED
POC Morphine: NOT DETECTED
POC Oxazepam (BZO): POSITIVE — AB
POC Oxycodone UR: NOT DETECTED
POC Secobarbital (BAR): NOT DETECTED

## 2024-03-20 LAB — POC URINE PREG, ED: Preg Test, Ur: NEGATIVE

## 2024-03-20 LAB — TSH: TSH: 1.673 u[IU]/mL (ref 0.350–4.500)

## 2024-03-20 MED ORDER — TRAZODONE HCL 50 MG PO TABS: 50.0000 mg | ORAL_TABLET | Freq: Every evening | ORAL | Status: DC | PRN

## 2024-03-20 MED ORDER — DIPHENHYDRAMINE HCL 50 MG/ML IJ SOLN: 50.0000 mg | Freq: Three times a day (TID) | INTRAMUSCULAR | Status: DC | PRN

## 2024-03-20 MED ORDER — MAGNESIUM HYDROXIDE 400 MG/5ML PO SUSP: 30.0000 mL | Freq: Every day | ORAL | Status: DC | PRN

## 2024-03-20 MED ORDER — OLANZAPINE 5 MG PO TBDP: 5.0000 mg | ORAL_TABLET | Freq: Three times a day (TID) | ORAL | Status: DC | PRN

## 2024-03-20 MED ORDER — HYDROXYZINE HCL 25 MG PO TABS: 25.0000 mg | ORAL_TABLET | Freq: Three times a day (TID) | ORAL | Status: DC | PRN

## 2024-03-20 MED ORDER — AMOXICILLIN 500 MG PO CAPS
500.0000 mg | ORAL_CAPSULE | Freq: Three times a day (TID) | ORAL | Status: DC
  Administered 2024-03-21 – 2024-03-22 (×4): 500 mg via ORAL
  Filled 2024-03-20 (×6): qty 1

## 2024-03-20 MED ORDER — HALOPERIDOL 5 MG PO TABS: 5.0000 mg | ORAL_TABLET | Freq: Three times a day (TID) | ORAL | Status: DC | PRN

## 2024-03-20 MED ORDER — LORAZEPAM 2 MG/ML IJ SOLN: 2.0000 mg | Freq: Three times a day (TID) | INTRAMUSCULAR | Status: DC | PRN

## 2024-03-20 MED ORDER — OLANZAPINE 5 MG PO TBDP
5.0000 mg | ORAL_TABLET | Freq: Every day | ORAL | Status: DC
  Administered 2024-03-20: 5 mg via ORAL
  Filled 2024-03-20: qty 1

## 2024-03-20 MED ORDER — ALUM & MAG HYDROXIDE-SIMETH 200-200-20 MG/5ML PO SUSP: 30.0000 mL | ORAL | Status: DC | PRN

## 2024-03-20 MED ORDER — HALOPERIDOL LACTATE 5 MG/ML IJ SOLN: 10.0000 mg | Freq: Three times a day (TID) | INTRAMUSCULAR | Status: DC | PRN

## 2024-03-20 MED ORDER — HALOPERIDOL LACTATE 5 MG/ML IJ SOLN: 5.0000 mg | Freq: Three times a day (TID) | INTRAMUSCULAR | Status: DC | PRN

## 2024-03-20 MED ORDER — DIPHENHYDRAMINE HCL 50 MG PO CAPS: 50.0000 mg | ORAL_CAPSULE | Freq: Three times a day (TID) | ORAL | Status: DC | PRN

## 2024-03-20 MED ORDER — AMOXICILLIN 500 MG PO CAPS
500.0000 mg | ORAL_CAPSULE | Freq: Three times a day (TID) | ORAL | Status: DC
  Administered 2024-03-20: 500 mg via ORAL
  Filled 2024-03-20: qty 1

## 2024-03-20 MED ORDER — OLANZAPINE 10 MG IM SOLR
5.0000 mg | Freq: Three times a day (TID) | INTRAMUSCULAR | Status: DC | PRN
  Administered 2024-03-21: 5 mg via INTRAMUSCULAR
  Filled 2024-03-20: qty 10

## 2024-03-20 NOTE — Plan of Care (Signed)
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
## 2024-03-20 NOTE — BH Assessment (Signed)
 Comprehensive Clinical Assessment (CCA) Note  03/20/2024 Sara Briggs [id number]  Disposition: Per Jon Aldrich, NP Patient is recommended for inpatient treatment.   The patient demonstrates the following risk factors for suicide: Chronic risk factors for suicide include: N/A. Acute risk factors for suicide include: N/A. Protective factors for this patient include: responsibility to others (children, family) and caring giver for mother. Considering these factors, the overall suicide risk at this point appears to be low. Patient is appropriate for outpatient follow up.  Per triage note: Sara Briggs is a 58 year old female presenting to [facility] accompanied by her husband. Pt appears to be slightly paranoid and manic throughout triage. Pts husband states her doctor had speculated that she has symptoms of Bipolar Disorder. Pts husband is looking for her to be admitted for inpatient. Pts husband states that she is very high and lovw and last night appeared to be in a manic state. Pts husband states that his wife is having consistent spells of manic behavior every other day. Pt has currently been off of her medication for the past month. Pt denies substance use, Si, Hi and AVH.  Patient is a 58 year old female who presents voluntarily to [facility] accompanied by her husband with complaints of paranoia and manic behavior. Patient appears to be having rapid mood swings as one minute she was laughing then next was was tearful.  Patient  reports having difficulty sleeping and reports a change in appetite.   She reports having auditory hallucinations, as well as visual hallucinations.  Patient says that her deceased father has come to help her through this difficult time.   Patient denies SI and HI.  She denies any alcohol or illicit substance use. 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  She feels that if her husband would listen to her then she may not ave gotten to this point.   Patient is casually dressed and alert and oriented x4.Patient's speech is pressured with normal tone and volume. The patient's mood is anxious with labile affect.  Patient's thought processes are loose and tangential.  Objectively  there is no indication that the patient is currently responding to internal stimuli or experiencing delusional thought content.  The patient was cooperative throughout assessment.   Chief Complaint:  Chief Complaint  Patient presents with   Manic Behavior   Paranoid   Visit Diagnosis:  Psychosis, unspecified psychosis type (HCC)    CCA Screening, Triage and Referral (STR)  Patient Reported Information How did you hear about us ? Family/Friend  What Is the Reason for Your Visit/Call Today? Sara Briggs is a 58 year old female presenting to [facility] accompanied by her husband. Pt appears to be slightly paranoid and manic throughout triage. Pts husband states her doctor had speculated that she has symptoms of Bipolar Disorder. Pts husband is looking for her to be admitted for inpatient. Pts husband states that she is very high and lovw and last night appeared to be in a manic state. Pts husband states that his wife is having consistent spells of manic behavior every other day. Pt has currently been off of her medication for the past month. Pt denies substance use, Si, Hi and AVH.  How Long Has This Been Causing You Problems? 1 wk - 1 month  What Do You Feel Would Help You the Most Today?  Medication(s)   Have You Recently Had Any Thoughts About Hurting Yourself? No  Are You Planning to Commit Suicide/Harm Yourself At This time? No   Flowsheet Row ED from 03/20/2024 in [facility] ED from 11/09/2023 in [facility] Emergency Department at [facility] ED from 01/18/2023 in [facility] Emergency Department at [facility]  C-SSRS  RISK CATEGORY No Risk No Risk No Risk    Have you Recently Had Thoughts About Hurting Someone Sara Briggs? No  Are You Planning to Harm Someone at This Time? No  Explanation: N/A   Have You Used Any Alcohol or Drugs in the Past 24 Hours? No  How Long Ago Did You Use Drugs or Alcohol? N/A What Did You Use and How Much? N/A  Do You Currently Have a Therapist/Psychiatrist? No  Name of Therapist/Psychiatrist:    Have You Been Recently Discharged From Any Office Practice or Programs? No  Explanation of Discharge From Practice/Program:N/A    CCA Screening Triage Referral Assessment Type of Contact: Face-to-Face  Telemedicine Service Delivery:   Is this Initial or Reassessment?   Date Telepsych consult ordered in [facility]:    Time Telepsych consult ordered in [facility]:    Location of Assessment: [facility]  Provider Location: [facility] Assessment Services   Collateral Involvement: N/A   Does Patient Have a Automotive Engineer Guardian? No  Legal Guardian Contact Information: N/A  Copy of Legal Guardianship Form: -- (N/A)  Legal Guardian Notified of Arrival: -- (N/A)  Legal Guardian Notified of Pending Discharge: -- (N/A)  If Minor and Not Living with Parent(s), Who has Custody? N/A  Is CPS involved or ever been involved? Never Is APS involved or ever been involved? Never   Patient Determined To Be At Risk for Harm To Self or Others Based on Review of Patient Reported Information or Presenting Complaint? No Method: No Plan  Availability of Means: No access or NA  Intent: Vague intent or NA  Notification Required: No need or identified person  Additional Information for Danger to Others Potential: -- (N/A)  Additional Comments for Danger to Others Potential: N/A  Are There Guns or Other Weapons in Your Home? Yes  Types of Guns/Weapons: UTA  Are These Weapons Safely Secured?                            Yes  Who Could Verify You Are Able To Have These  Secured: pt's husband  Do You Have any Outstanding Charges, Pending Court Dates, Parole/Probation? Pt deniws  Contacted To Inform of Risk of Harm To Self or Others: -- (N/A)    Does Patient Present under Involuntary Commitment? No    [place] of Residence: [place]   Patient Currently Receiving the Following Services: Not Receiving Services   Determination of Need: Urgent (48 hours)   Options For Referral: Inpatient Hospitalization; Medication Management     CCA Biopsychosocial Patient Reported Schizophrenia/Schizoaffective Diagnosis in Past: No   Strengths: educated, willing to engage in servcies   Mental Health Symptoms Depression:  Change in energy/activity; Difficulty Concentrating; Sleep (too much or little); Tearfulness; Increase/decrease in appetite   Duration of Depressive symptoms: Duration of Depressive Symptoms: Less than two weeks   Mania:  Change in energy/activity; Increased Energy; Racing thoughts   Anxiety:   Difficulty concentrating; Restlessness; Worrying   Psychosis:  Grossly disorganized speech   Duration of Psychotic symptoms: Duration of Psychotic Symptoms: Less than  six months   Trauma:  None   Obsessions:  None   Compulsions:  None   Inattention:  None   Hyperactivity/Impulsivity:  None   Oppositional/Defiant Behaviors:  None   Emotional Irregularity:  None   Other Mood/Personality Symptoms:  N/A    Mental Status Exam Appearance and self-care  Stature:  Average  Weight:  Average weight   Clothing:  Casual   Grooming:  Normal   Cosmetic use:  None   Posture/gait:  Normal   Motor activity:  Not Remarkable   Sensorium  Attention:  Confused   Concentration:  Scattered   Orientation:  Person; Place; Situation   Recall/memory:  Defective in Immediate; Defective in Short-term   Affect and Mood  Affect:  Labile   Mood:  Anxious   Relating  Eye contact:  Normal   Facial expression:  Responsive   Attitude  toward examiner:  Cooperative   Thought and Language  Speech flow: Pressured   Thought content:  Appropriate to Mood and Circumstances   Preoccupation:  None   Hallucinations:  Auditory   Organization:  Scientist, Research (life Sciences) of Knowledge:  Fair   Intelligence:  Average   Abstraction:  Abstract   Judgement:  Fair   Reality Testing:  Distorted   Insight:  Fair   Decision Making:  Only simple   Social Functioning  Social Maturity:  Responsible   Social Judgement:  Normal   Stress  Stressors:  Relationship   Coping Ability:  Human Resources Officer Deficits:  None   Supports:  Church; Family     Religion: Religion/Spirituality Are You A Religious Person?: Yes What is Your Religious Affiliation?: Baptist How Might This Affect Treatment?: N/A  Leisure/Recreation: Leisure / Recreation Do You Have Hobbies?: Yes Leisure and Hobbies: reading, going on walks, Palying with grandson  Exercise/Diet: Exercise/Diet Do You Exercise?: Yes What Type of Exercise Do You Do?: Run/Walk How Many Times a Week Do You Exercise?: 1-3 times a week Have You Gained or Lost A Significant Amount of Weight in the Past Six Months?: No Do You Follow a Special Diet?: No Do You Have Any Trouble Sleeping?: No   CCA Employment/Education Employment/Work Situation: Employment / Work Clinical Biochemist has Been Impacted by Current Illness: No Has Patient ever Been in Equities Trader?: No  Education: Education Is Patient Currently Attending School?: No Last Grade Completed: 12 Did You Product Manager?: Yes What Type of College Degree Do you Have?: Interior And Spatial Designer of Nursing from [employer] Did You Have An Individualized Education Program (IIEP): No Did You Have Any Difficulty At School?: No Patient's Education Has Been Impacted by Current Illness: No   CCA Family/Childhood History Family and Relationship History:    Childhood History:  Childhood History By  whom was/is the patient raised?: Both parents Did patient suffer any verbal/emotional/physical/sexual abuse as a child?: No Did patient suffer from severe childhood neglect?: No Has patient ever been sexually abused/assaulted/raped as an adolescent or adult?: No Was the patient ever a victim of a crime or a disaster?: No Witnessed domestic violence?: No Has patient been affected by domestic violence as an adult?: No       CCA Substance Use Alcohol/Drug Use: Alcohol / Drug Use Pain Medications: See Mar Prescriptions: See Mar Over the Counter: See Mar History of alcohol / drug use?: No history of alcohol / drug abuse Longest period of sobriety (when/how long): N/A Negative Consequences of Use:  (N/A) Withdrawal Symptoms:  (  N/A)                         ASAM's:  Six Dimensions of Multidimensional Assessment  Dimension 1:  Acute Intoxication and/or Withdrawal Potential:   Dimension 1:  Description of individual's past and current experiences of substance use and withdrawal: N/A  Dimension 2:  Biomedical Conditions and Complications:   Dimension 2:  Description of patient's biomedical conditions and  complications: N/A  Dimension 3:  Emotional, Behavioral, or Cognitive Conditions and Complications:  Dimension 3:  Description of emotional, behavioral, or cognitive conditions and complications: N/A  Dimension 4:  Readiness to Change:  Dimension 4:  Description of Readiness to Change criteria: N/A  Dimension 5:  Relapse, Continued use, or Continued Problem Potential:  Dimension 5:  Relapse, continued use, or continued problem potential critiera description: N/A  Dimension 6:  Recovery/Living Environment:  Dimension 6:  Recovery/Iiving environment criteria description: N/A  ASAM Severity Score:    ASAM Recommended Level of Treatment: ASAM Recommended Level of Treatment:  (N/A)   Substance use Disorder (SUD) Substance Use Disorder (SUD)  Checklist Symptoms of Substance Use:   (N/A)  Recommendations for Services/Supports/Treatments: Recommendations for Services/Supports/Treatments Recommendations For Services/Supports/Treatments:  (N/A)  Disposition Recommendation per psychiatric provider: We recommend inpatient psychiatric hospitalization when medically cleared. Patient is under voluntary admission status at this time; please IVC if attempts to leave hospital.   DSM5 Diagnoses: Patient Active Problem List   Diagnosis Date Noted   Chest pain of uncertain etiology 01/09/2019   Palpitations 01/09/2019     Referrals to Alternative Service(s): Referred to Alternative Service(s):   Place:   Date:   Time:    Referred to Alternative Service(s):   Place:   Date:   Time:    Referred to Alternative Service(s):   Place:   Date:   Time:    Referred to Alternative Service(s):   Place:   Date:   Time:     Lianne JINNY Shuck, LCSW

## 2024-03-20 NOTE — Group Note (Signed)
 Date:  03/20/2024 Time:  7:23 PM  Group Topic/Focus:  Developing a Wellness Toolbox:   The focus of this group is to help patients develop a wellness toolbox with skills and strategies to promote recovery upon discharge.     Participation Level:  Did Not Attend   Sara Briggs 03/20/2024, 7:23 PM

## 2024-03-20 NOTE — Progress Notes (Addendum)
 Pt was accepted to [facility] on 03/20/2024 . Bed assignment:302   Pt meets inpatient criteria per Angela McLauchlin, NP   Attending Physician will be: Dr.Jadepalle    Report can be called to: [phone]  Pt can arrive pending labs   Care Team Notified: [facility] Cherylynn Ernst, RN ,Camellia Police, RN,  Ryta Sherleen RN,

## 2024-03-20 NOTE — Group Note (Signed)
 Date:  03/20/2024 Time:  10:31 PM  Group Topic/Focus:  Wrap-Up Group:   The focus of this group is to help patients review their daily goal of treatment and discuss progress on daily workbooks.    Participation Level:  Did Not Attend    Arlester CHRISTELLA Servant 03/20/2024, 10:31 PM

## 2024-03-20 NOTE — ED Provider Notes (Addendum)
 [facility] Continuous Assessment Admission H&P  Date: 03/20/24 Patient Name: Sara Briggs MRN: [id number] Chief Complaint: Manic / Paranoid behavior  Diagnoses:  Final diagnoses:  Psychosis, unspecified psychosis type [facility])  Delirium  Urinary retention  Mastoiditis of left side  Hyperglycemia  Frequent UTI  Tinnitus, unspecified laterality  Noncompliance with treatment regimen  Encephalitis  Hypokalemia  Benzodiazepine-induced psychosis with complication (HCC)  Benzodiazepine withdrawal with delirium (HCC)  Essential hypertension  Drug-induced mood disorder (HCC)    HPI: Sara Briggs, 58 y.o., female patient presented to [facility] as a walk-in accompanied by her husband with complaints of paranoia, mood instability, and possible manic episodes.  Sara Briggs, 58 y.o., female patient seen face to face by this provider; and chart reviewed on 03/20/24. On evaluation Sara Briggs reports experiencing rapid mood swings, difficulty sleeping, intermittent crying spells, laughing, anger, and episodes of agitation. She states she has been having auditory hallucinations ("hearing voices") and visual hallucinations (pictures appearing to move on the wall). She reports this is her third episode since Labor Day. She states her first episode was believed to be Lunesta-induced, so she discontinued that medication.  Her husband provides most of the collateral information and reports that her primary doctor suspected possible Bipolar Disorder. He reports Sara Briggs has been cycling between "high and low" every other day and describes last night as a severe manic episode, during which she was crying, laughing, angry, and became violent, prompting him to call the sheriff. He states he temporarily stopped all her medications over the past several days to see if symptoms would improve, but they have persisted. He is seeking inpatient admission for stabilization.  Sara Briggs denies substance use, suicidal ideation, and  homicidal ideation. 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  During evaluation Sara Briggs is seated upright in no acute distress. She is alert, oriented x 4, calm, cooperative, and attentive. Her mood is anxious with labile affect. She has pressured but understandable speech, and behavior is appropriate during the assessment. Objectively, there is evidence of mood instability and psychotic symptoms, including reported hallucinations and paranoia, although she is able to converse coherently when redirected. Thought processes are loose and tangential at times, with intermittent distractibility but no acute safety concerns reported. She denies suicidal/self-harm/homicidal ideation but endorses prior hallucinations and paranoia. Patient answered questions with some redirection.  Total Time spent with patient: 1.5 hours  Musculoskeletal  Strength & Muscle Tone: within normal limits Gait & Station: normal Patient leans: N/A  Psychiatric Specialty Exam  Presentation General Appearance:  Appropriate for Environment  Eye Contact: Good  Speech: Pressured  Speech Volume: Normal  Handedness: Right   Mood and Affect  Mood: Anxious; Labile  Affect: Congruent   Thought Process  Thought Processes: Disorganized  Descriptions of Associations:Loose  Orientation:Full (Time, Place and Person)  Thought Content:Logical    Hallucinations:Hallucinations: Auditory; Visual Description of Auditory Hallucinations: Can hear people talking Description of Visual Hallucinations: picture on wall moving  Ideas of Reference:No data recorded Suicidal Thoughts:Suicidal Thoughts: No  Homicidal Thoughts:Homicidal Thoughts: No   Sensorium  Memory: Immediate Good  Judgment: Fair  Insight: Fair   Art Therapist  Concentration: Poor  Attention Span: Fair  Recall: Fair  Fund of  Knowledge: Fair  Language: Fair   Psychomotor Activity  Psychomotor Activity: Psychomotor Activity: Normal   Assets  Assets: Desire for Improvement; Intimacy   Sleep  Sleep: Sleep: Poor  Nutritional Assessment (For OBS and FBC admissions only) Has the patient had a weight loss or gain of 10 pounds or more in the last 3 months?: No Has the patient had a decrease in food intake/or appetite?: No Does the patient have dental problems?: No Does the patient have eating habits or behaviors that may be indicators of an eating disorder including binging or inducing vomiting?: No Has the patient recently lost weight without trying?: 0 Has the patient been eating poorly because of a decreased appetite?: 0 Malnutrition Screening Tool Score: 0    Physical Exam HENT:     Head: Normocephalic.     Mouth/Throat:     Pharynx: Oropharynx is clear.  Eyes:     Extraocular Movements: Extraocular movements intact.  Pulmonary:     Effort: Pulmonary effort is normal.  Musculoskeletal:        General: Normal range of motion.     Cervical back: Normal range of motion.  Skin:    General: Skin is dry.  Neurological:     Mental Status: She is alert.    Review of Systems  Constitutional: Negative.   HENT:  Positive for hearing loss and tinnitus.   Eyes: Negative.   Respiratory: Negative.    Cardiovascular: Negative.   Gastrointestinal: Negative.   Genitourinary: Negative.   Musculoskeletal: Negative.   Skin: Negative.   Neurological:  Positive for headaches.  Psychiatric/Behavioral:  Positive for hallucinations. The patient is nervous/anxious and has insomnia.     Blood pressure (!) 144/103, pulse 100, temperature 98.6 F (37 C), temperature source Oral, resp. rate 20, last menstrual period 10/01/2012, SpO2 99%. There is no height or weight on file to calculate BMI.  Past Psychiatric History: psychosis   Is the patient at risk to self? Yes  Has the patient been a risk to self  in the past 6 months? Yes .    Has the patient been a risk to self within the distant past? No   Is the patient a risk to others? No   Has the patient been a risk to others in the past 6 months? No   Has the patient been a risk to others within the distant past? No   Past Medical History: hearing loss, and a diagnosis of Mnire's disease.  Family History: none reported  Social History: lives with husband  Last Labs:  Admission on 03/20/2024  Component Date Value Ref Range Status   WBC 03/20/2024 6.9  4.0 - 10.5 K/uL Final   RBC 03/20/2024 4.69  3.87 - 5.11 MIL/uL Final   Hemoglobin 03/20/2024 14.8  12.0 - 15.0 g/dL Final   HCT 88/73/7974 43.3  36.0 - 46.0 % Final   MCV 03/20/2024 92.3  80.0 - 100.0 fL Final   MCH 03/20/2024 31.6  26.0 - 34.0 pg Final   MCHC 03/20/2024 34.2  30.0 - 36.0 g/dL Final   RDW 88/73/7974 13.1  11.5 - 15.5 % Final   Platelets 03/20/2024 380  150 - 400 K/uL Final   nRBC 03/20/2024 0.0  0.0 - 0.2 % Final   Neutrophils Relative % 03/20/2024 75  % Final   Neutro Abs 03/20/2024 5.1  1.7 - 7.7 K/uL Final   Lymphocytes Relative 03/20/2024 19  % Final   Lymphs Abs 03/20/2024 1.3  0.7 - 4.0 K/uL Final   Monocytes Relative 03/20/2024 6  % Final   Monocytes Absolute 03/20/2024 0.4  0.1 - 1.0 K/uL Final   Eosinophils Relative 03/20/2024 0  %  Final   Eosinophils Absolute 03/20/2024 0.0  0.0 - 0.5 K/uL Final   Basophils Relative 03/20/2024 0  % Final   Basophils Absolute 03/20/2024 0.0  0.0 - 0.1 K/uL Final   Immature Granulocytes 03/20/2024 0  % Final   Abs Immature Granulocytes 03/20/2024 0.01  0.00 - 0.07 K/uL Final   Performed at [facility], [address], [place], [place] [address]   Sodium 03/20/2024 140  135 - 145 mmol/L Final   Potassium 03/20/2024 3.3 (L)  3.5 - 5.1 mmol/L Final   Chloride 03/20/2024 103  98 - 111 mmol/L Final   CO2 03/20/2024 25  22 - 32 mmol/L Final   Glucose, Bld 03/20/2024 201 (H)  70 - 99 mg/dL Final   Glucose reference  range applies only to samples taken after fasting for at least 8 hours.   BUN 03/20/2024 6  6 - 20 mg/dL Final   Creatinine, Ser 03/20/2024 0.75  0.44 - 1.00 mg/dL Final   Calcium  03/20/2024 9.5  8.9 - 10.3 mg/dL Final   Total Protein 88/73/7974 8.3 (H)  6.5 - 8.1 g/dL Final   Albumin 88/73/7974 4.7  3.5 - 5.0 g/dL Final   AST 88/73/7974 28  15 - 41 U/L Final   ALT 03/20/2024 29  0 - 44 U/L Final   Alkaline Phosphatase 03/20/2024 64  38 - 126 U/L Final   Total Bilirubin 03/20/2024 0.9  0.0 - 1.2 mg/dL Final   GFR, Estimated 03/20/2024 >60  >60 mL/min Final   Comment: (NOTE) Calculated using the CKD-EPI Creatinine Equation (2021)    Anion gap 03/20/2024 12  5 - 15 Final   Performed at [facility], [address] [place], [place] [address]   TSH 03/20/2024 1.673  0.350 - 4.500 uIU/mL Final   Comment: Performed by a 3rd Generation assay with a functional sensitivity of <=0.01 uIU/mL. Performed at [facility], [address] [place], [place] [address]    Preg Test, Ur 03/20/2024 Negative  Negative Final   POC Amphetamine UR 03/20/2024 None Detected  NONE DETECTED (Cut Off Level 1000 ng/mL) Final   POC Secobarbital (BAR) 03/20/2024 None Detected  NONE DETECTED (Cut Off Level 300 ng/mL) Final   POC Buprenorphine (BUP) 03/20/2024 None Detected  NONE DETECTED (Cut Off Level 10 ng/mL) Final   POC Oxazepam (BZO) 03/20/2024 Positive (A)  NONE DETECTED (Cut Off Level 300 ng/mL) Final   POC Cocaine UR 03/20/2024 None Detected  NONE DETECTED (Cut Off Level 300 ng/mL) Final   POC Methamphetamine UR 03/20/2024 None Detected  NONE DETECTED (Cut Off Level 1000 ng/mL) Final   POC Morphine  03/20/2024 None Detected  NONE DETECTED (Cut Off Level 300 ng/mL) Final   POC Methadone UR 03/20/2024 None Detected  NONE DETECTED (Cut Off Level 300 ng/mL) Final   POC Oxycodone  UR 03/20/2024 None Detected  NONE DETECTED (Cut Off Level 100 ng/mL) Final   POC Marijuana UR 03/20/2024 None Detected  NONE  DETECTED (Cut Off Level 50 ng/mL) Final  Admission on 11/09/2023, Discharged on 11/10/2023  Component Date Value Ref Range Status   Sodium 11/09/2023 136  135 - 145 mmol/L Final   Potassium 11/09/2023 3.9  3.5 - 5.1 mmol/L Final   Chloride 11/09/2023 101  98 - 111 mmol/L Final   CO2 11/09/2023 24  22 - 32 mmol/L Final   Glucose, Bld 11/09/2023 188 (H)  70 - 99 mg/dL Final   Glucose reference range applies only to samples  taken after fasting for at least 8 hours.   BUN 11/09/2023 12  6 - 20 mg/dL Final   Creatinine, Ser 11/09/2023 1.01 (H)  0.44 - 1.00 mg/dL Final   Calcium  11/09/2023 8.8 (L)  8.9 - 10.3 mg/dL Final   GFR, Estimated 11/09/2023 >60  >60 mL/min Final   Anion gap 11/09/2023 11  5 - 15 Final   Performed at [facility], [address], [place], [place] [address]   WBC 11/09/2023 7.3  4.0 - 10.5 K/uL Final   RBC 11/09/2023 4.87  3.87 - 5.11 MIL/uL Final   Hemoglobin 11/09/2023 15.4 (H)  12.0 - 15.0 g/dL Final   HCT 92/82/7974 45.8  36.0 - 46.0 % Final   MCV 11/09/2023 94.0  80.0 - 100.0 fL Final   MCH 11/09/2023 31.6  26.0 - 34.0 pg Final   MCHC 11/09/2023 33.6  30.0 - 36.0 g/dL Final   RDW 92/82/7974 12.8  11.5 - 15.5 % Final   Platelets 11/09/2023 301  150 - 400 K/uL Final   nRBC 11/09/2023 0.0  0.0 - 0.2 % Final   Performed at [facility], [address] [place], [place] [address]   Troponin I (High Sensitivity) 11/09/2023 2  <18 ng/L Final   Comment: (NOTE) Elevated high sensitivity troponin I (hsTnI) values and significant  changes across serial measurements may suggest ACS but many other  chronic and acute conditions are known to elevate hsTnI results.  Refer to the Links section for chest pain algorithms and additional  guidance. Performed at [facility], [address], [place], [place] [address]    Sodium 11/09/2023 137  135 - 145 mmol/L Final   Potassium 11/09/2023 4.0  3.5 - 5.1 mmol/L Final   Chloride 11/09/2023 103  98 - 111 mmol/L  Final   BUN 11/09/2023 13  6 - 20 mg/dL Final   Creatinine, Ser 11/09/2023 1.10 (H)  0.44 - 1.00 mg/dL Final   Glucose, Bld 92/82/7974 188 (H)  70 - 99 mg/dL Final   Glucose reference range applies only to samples taken after fasting for at least 8 hours.   Calcium , Ion 11/09/2023 1.00 (L)  1.15 - 1.40 mmol/L Final   TCO2 11/09/2023 25  22 - 32 mmol/L Final   Hemoglobin 11/09/2023 15.6 (H)  12.0 - 15.0 g/dL Final   HCT 92/82/7974 46.0  36.0 - 46.0 % Final   Lactic Acid, Venous 11/09/2023 0.7  0.5 - 1.9 mmol/L Final   Total Protein 11/09/2023 7.3  6.5 - 8.1 g/dL Final   Albumin 92/82/7974 4.0  3.5 - 5.0 g/dL Final   AST 92/82/7974 19  15 - 41 U/L Final   ALT 11/09/2023 16  0 - 44 U/L Final   Alkaline Phosphatase 11/09/2023 54  38 - 126 U/L Final   Total Bilirubin 11/09/2023 0.7  0.0 - 1.2 mg/dL Final   Bilirubin, Direct 11/09/2023 <0.1  0.0 - 0.2 mg/dL Final   Indirect Bilirubin 11/09/2023 NOT CALCULATED  0.3 - 0.9 mg/dL Final   Performed at [facility], [address] [place], [place] [address]   Color, Urine 11/09/2023 STRAW (A)  YELLOW Final   APPearance 11/09/2023 CLEAR  CLEAR Final   Specific Gravity, Urine 11/09/2023 1.009  1.005 - 1.030 Final   pH 11/09/2023 7.0  5.0 - 8.0 Final   Glucose, UA 11/09/2023 >=500 (A)  NEGATIVE mg/dL Final   Hgb urine dipstick 11/09/2023 NEGATIVE  NEGATIVE Final  Bilirubin Urine 11/09/2023 NEGATIVE  NEGATIVE Final   Ketones, ur 11/09/2023 NEGATIVE  NEGATIVE mg/dL Final   Protein, ur 92/82/7974 NEGATIVE  NEGATIVE mg/dL Final   Nitrite 92/82/7974 NEGATIVE  NEGATIVE Final   Leukocytes,Ua 11/09/2023 NEGATIVE  NEGATIVE Final   RBC / HPF 11/09/2023 0-5  0 - 5 RBC/hpf Final   WBC, UA 11/09/2023 0-5  0 - 5 WBC/hpf Final   Bacteria, UA 11/09/2023 NONE SEEN  NONE SEEN Final   Squamous Epithelial / HPF 11/09/2023 0-5  0 - 5 /HPF Final   Performed at [facility], [address], [place], [place] [address]   D-Dimer, Quant 11/09/2023 0.43  0.00  - 0.50 ug/mL-FEU Final   Comment: (NOTE) At the manufacturer cut-off value of 0.5 g/mL FEU, this assay has a negative predictive value of 95-100%.This assay is intended for use in conjunction with a clinical pretest probability (PTP) assessment model to exclude pulmonary embolism (PE) and deep venous thrombosis (DVT) in outpatients suspected of PE or DVT. Results should be correlated with clinical presentation. Performed at [facility], [address] [place], [place] [address]    Troponin I (High Sensitivity) 11/09/2023 3  <18 ng/L Final   Comment: (NOTE) Elevated high sensitivity troponin I (hsTnI) values and significant  changes across serial measurements may suggest ACS but many other  chronic and acute conditions are known to elevate hsTnI results.  Refer to the Links section for chest pain algorithms and additional  guidance. Performed at [facility], [address], [place], [place] [address]     Allergies: Apple juice, Ancef [cefazolin], Metformin and related, Onglyza [saxagliptin], Tape, Percocet [oxycodone -acetaminophen ], Sulfa antibiotics, and Vicodin [hydrocodone -acetaminophen ]  Medications:  Facility Ordered Medications  Medication   alum & mag hydroxide-simeth (MAALOX/MYLANTA) 200-200-20 MG/5ML suspension 30 mL   magnesium  hydroxide (MILK OF MAGNESIA) suspension 30 mL   haloperidol  (HALDOL ) tablet 5 mg   And   diphenhydrAMINE  (BENADRYL ) capsule 50 mg   haloperidol  lactate (HALDOL ) injection 5 mg   And   diphenhydrAMINE  (BENADRYL ) injection 50 mg   And   LORazepam  (ATIVAN ) injection 2 mg   haloperidol  lactate (HALDOL ) injection 10 mg   And   diphenhydrAMINE  (BENADRYL ) injection 50 mg   And   LORazepam  (ATIVAN ) injection 2 mg   hydrOXYzine  (ATARAX ) tablet 25 mg   traZODone  (DESYREL ) tablet 50 mg   OLANZapine  zydis (ZYPREXA ) disintegrating tablet 5 mg   amoxicillin  (AMOXIL ) capsule 500 mg   PTA Medications  Medication Sig   ondansetron   (ZOFRAN  ODT) 4 MG disintegrating tablet 4mg  ODT q6 hours prn nausea/vomit   hydrocortisone (PROCTOSOL HC) 2.5 % rectal cream Place 1 application rectally 2 (two) times daily.   traMADol  (ULTRAM ) 50 MG tablet Take 1 tablet (50 mg total) by mouth as needed.   linagliptin  (TRADJENTA ) 5 MG TABS tablet Take 5 mg by mouth daily.   triamterene -hydrochlorothiazide  (DYAZIDE ) 37.5-25 MG capsule Take 1 capsule by mouth daily.   estradiol  (ESTRACE ) 0.01 % CREA vaginal cream Place 1 Applicatorful vaginally 3 (three) times a week.   diphenhydrAMINE  (BENADRYL ) 25 MG tablet Take 12.5 mg by mouth at bedtime as needed for itching or sleep.   calcium  citrate (CALCITRATE - DOSED IN MG ELEMENTAL CALCIUM ) 950 (200 Ca) MG tablet Take 500 mg by mouth 2 (two) times daily.   amoxicillin  (AMOXIL ) 500 MG capsule Take 500 mg by mouth 3 (three) times daily.      Medical Decision Making  Patient poses a  risk of imminent danger to self, or others.   58 year old female with a history of multiple bouts of altered mental statues with potential medical and medication causes and without a psychiatric history presents for altered mental statues. She was recently seen and had a partial work-up but did not complete recommended hospitalization and treatment for mastoiditis including not completing a course of antibiotics. She she has a complicated medication history of pelvic reconstruction and cochlear implant. She had possible reactions to anesthesia and Lunesta of similar symptoms.        Recommendations  Patient recommended for inpatient treatment/continuous observation for crisis stabilization, mood stabilization and medication management. Verbal consent obtained.Patient may need repeats on some of the medical work up. She at the least may need repeat CT and may need IV antibiotics.   Carlye Panameno, NP 03/20/24  4:54 PM

## 2024-03-20 NOTE — Progress Notes (Signed)
 Pt admitted to OBS .  She was searched with no contraband found.  Pt escorted to unit.  Pt took medications without much prompting.  Pt was given sandwich and something to drink. Q 15 min started for safety.

## 2024-03-20 NOTE — ED Notes (Signed)
 Safe transport here at this time.  Pt used the restroom and was escorted to sally port. Pt's husband Bard) was called per request.  Pt ambulated to sally port and left without incident.   All belongings were given to safe transport. No distress noted.

## 2024-03-20 NOTE — Progress Notes (Signed)
 Patient arrived voluntary to the unit. Patient reports having 3 to 4 disassociate type episodes since her bladder surgery and cochlear implant placement. She reports the episodes occur around bed time and last for 9 to 24 hours. She denies SI/HI/AVH.    03/20/24 2100  Psych Admission Type (Psych Patients Only)  Admission Status Voluntary  Psychosocial Assessment  Patient Complaints Nervousness;Anxiety  Eye Contact Fair  Facial Expression Sad;Worried  Affect Appropriate to circumstance  Speech Logical/coherent;Soft  Interaction Assertive  Motor Activity Slow  Appearance/Hygiene Unremarkable;In scrubs  Behavior Characteristics Cooperative;Appropriate to situation  Mood Pleasant  Thought Process  Coherency Circumstantial  Content WDL  Delusions None reported or observed  Perception WDL  Hallucination None reported or observed  Judgment WDL  Confusion Mild  Danger to Self  Current suicidal ideation? Denies  Danger to Others  Danger to Others None reported or observed

## 2024-03-21 DIAGNOSIS — R338 Other retention of urine: Secondary | ICD-10-CM

## 2024-03-21 LAB — HEMOGLOBIN A1C
Hgb A1c MFr Bld: 8.4 % — ABNORMAL HIGH (ref 4.8–5.6)
Mean Plasma Glucose: 194 mg/dL

## 2024-03-21 LAB — URINALYSIS, COMPLETE (UACMP) WITH MICROSCOPIC
Bacteria, UA: NONE SEEN
Bilirubin Urine: NEGATIVE
Glucose, UA: 50 mg/dL — AB
Hgb urine dipstick: NEGATIVE
Ketones, ur: NEGATIVE mg/dL
Leukocytes,Ua: NEGATIVE
Nitrite: NEGATIVE
Protein, ur: NEGATIVE mg/dL
Specific Gravity, Urine: 1.004 — ABNORMAL LOW (ref 1.005–1.030)
pH: 6 (ref 5.0–8.0)

## 2024-03-21 LAB — BASIC METABOLIC PANEL WITH GFR
Anion gap: 13 (ref 5–15)
BUN: 11 mg/dL (ref 6–20)
CO2: 23 mmol/L (ref 22–32)
Calcium: 9.3 mg/dL (ref 8.9–10.3)
Chloride: 103 mmol/L (ref 98–111)
Creatinine, Ser: 0.83 mg/dL (ref 0.44–1.00)
GFR, Estimated: >60 mL/min (ref >60)
Glucose, Bld: 207 mg/dL — ABNORMAL HIGH (ref 70–99)
Potassium: 3.4 mmol/L — ABNORMAL LOW (ref 3.5–5.1)
Sodium: 139 mmol/L (ref 135–145)

## 2024-03-21 LAB — GLUCOSE, CAPILLARY: Glucose-Capillary: 186 mg/dL — ABNORMAL HIGH (ref 70–99)

## 2024-03-21 MED ORDER — IBUPROFEN 600 MG PO TABS
600.0000 mg | ORAL_TABLET | Freq: Four times a day (QID) | ORAL | Status: DC | PRN
  Administered 2024-03-21 – 2024-03-25 (×7): 600 mg via ORAL
  Filled 2024-03-21 (×7): qty 1

## 2024-03-21 MED ORDER — ESTRADIOL 0.01 % VA CREA
1.0000 | TOPICAL_CREAM | VAGINAL | Status: DC
  Administered 2024-03-22 – 2024-03-25 (×2): 1 via VAGINAL
  Filled 2024-03-21: qty 42.5

## 2024-03-21 MED ORDER — CALCIUM CITRATE 950 (200 CA) MG PO TABS
500.0000 mg | ORAL_TABLET | Freq: Two times a day (BID) | ORAL | Status: DC
  Administered 2024-03-22 – 2024-03-25 (×4): 475 mg via ORAL
  Filled 2024-03-21 (×10): qty 1

## 2024-03-21 MED ORDER — TRIAMTERENE-HCTZ 37.5-25 MG PO TABS
1.0000 | ORAL_TABLET | Freq: Every day | ORAL | Status: DC
  Administered 2024-03-22 – 2024-03-25 (×3): 1 via ORAL
  Filled 2024-03-21 (×5): qty 1

## 2024-03-21 MED ORDER — POTASSIUM CHLORIDE CRYS ER 20 MEQ PO TBCR
20.0000 meq | EXTENDED_RELEASE_TABLET | Freq: Once | ORAL | Status: DC
  Filled 2024-03-21: qty 1

## 2024-03-21 MED ORDER — LINAGLIPTIN 5 MG PO TABS
5.0000 mg | ORAL_TABLET | Freq: Every day | ORAL | Status: DC
  Administered 2024-03-22 – 2024-03-25 (×3): 5 mg via ORAL
  Filled 2024-03-21 (×5): qty 1

## 2024-03-21 MED ORDER — DIAZEPAM 2 MG PO TABS: 2.0000 mg | ORAL_TABLET | Freq: Three times a day (TID) | ORAL | Status: DC | PRN

## 2024-03-21 MED ORDER — RISPERIDONE 1 MG PO TABS
0.5000 mg | ORAL_TABLET | Freq: Two times a day (BID) | ORAL | Status: DC
  Administered 2024-03-21 – 2024-03-22 (×2): 0.5 mg via ORAL
  Filled 2024-03-21 (×2): qty 1

## 2024-03-21 MED ORDER — TRAMADOL HCL 50 MG PO TABS
50.0000 mg | ORAL_TABLET | Freq: Four times a day (QID) | ORAL | Status: DC | PRN
  Administered 2024-03-21 – 2024-03-24 (×5): 50 mg via ORAL
  Filled 2024-03-21 (×5): qty 1

## 2024-03-21 MED ORDER — DIAZEPAM 2 MG PO TABS
2.0000 mg | ORAL_TABLET | Freq: Two times a day (BID) | ORAL | Status: DC | PRN
  Administered 2024-03-21: 2 mg via ORAL
  Filled 2024-03-21: qty 1

## 2024-03-21 NOTE — Group Note (Signed)
 Date:  03/21/2024 Time:  9:26 PM  Group Topic/Focus:  Coping With Mental Health Crisis:   The purpose of this group is to help patients identify strategies for coping with mental health crisis.  Group discusses possible causes of crisis and ways to manage them effectively.    Pt did not attend group.  [name] L 03/21/2024, 9:26 PM

## 2024-03-21 NOTE — Consult Note (Signed)
 [facility] Initial Consultation Note  Sara Briggs FMW:[id number] DOB: 1966-03-04 DOA: 03/20/2024  PCP: Samie Frederick, PA-C   Requesting Physician: Dr. Allyn   Reason for Consultation: Urinary retention  HPI: Sara Briggs is a 58 y.o. 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  I was contacted for medical consultation by the psychiatric team, as the patient is experiencing symptomatic urinary retention.  She complained of lower pelvic discomfort, bladder scan was performed with 658 mL of urine found to be in the bladder.  Hospitalist was contacted for recommendations.  On further review of the patient's chart, she is followed by gynecology Dr. Sherida and surgical repair of cystocele with rectocele in July 2025, after which she developed difficulty with intermittent urinary retention.  She was diagnosed by her urologist with high tone pelvic floor dysfunction and there was discussion about performing intermittent self-catheterization at home, and she was also prescribed PT.  It seems that she did not start PT yet.  Review of Systems: Please see HPI for pertinent positives and negatives. A complete 10 system review of systems are otherwise negative.  Past Medical History:  Diagnosis Date   Allergic    Allergy    Cancer (HCC)    skin cancer, basal cell carcinoma   Diabetes mellitus without complication (HCC)    Frequent headaches    Hearing loss    HOH (hard of hearing)    Hyperlipidemia    IBS (irritable bowel syndrome)    Meniere's disease    Obesity    Rectocele    Tinnitus    Past Surgical History:  Procedure Laterality Date   btl     CESAREAN SECTION     LAPAROSCOPY FOR ECTOPIC PREGNANCY      Social History:  reports that  she has never smoked. She has never used smokeless tobacco. She reports that she does not drink alcohol and does not use drugs.  Allergies  Allergen Reactions   Apple Juice Swelling and Rash    Mouth and tongue swelling   Ancef [Cefazolin]    Metformin And Related Nausea Only   Onglyza [Saxagliptin] Other (See Comments)    Change in taste   Tape Itching    takes off skin   Percocet [Oxycodone -Acetaminophen ] Rash   Sulfa Antibiotics Rash   Vicodin [Hydrocodone -Acetaminophen ] Rash    Family History  Problem Relation Age of Onset   Colon cancer Maternal Uncle    Colon cancer Maternal Grandfather    Diabetes Mother    Hypertension Mother    Hypertension Father    Sleep apnea Brother    Hyperlipidemia Brother    Heart disease Maternal Grandmother    Hypertension Paternal Grandmother    Hyperlipidemia Paternal Grandmother    CVA Paternal Grandfather    Hypertension Paternal Grandfather    Hyperlipidemia Paternal Grandfather    Sleep apnea Brother    Hearing loss Brother    Leukemia Paternal Uncle    Prostate cancer Maternal Uncle    Lung cancer Maternal Aunt      Prior to Admission medications   Medication Sig Start Date End Date Taking? Authorizing Provider  Acetaminophen  (TYLENOL  PO) Take by mouth. As needed   Yes [provider]  Betahistine HCl POWD 24 mg by Does not apply route 2 (two)  times daily.   Yes [provider]  calcium  citrate (CALCITRATE - DOSED IN MG ELEMENTAL CALCIUM ) 950 (200 Ca) MG tablet Take 500 mg by mouth 2 (two) times daily.   Yes [provider]  Cranberry-D Mannose 500-50 MG CHEW Chew 1,000 mg by mouth 2 (two) times daily.   Yes [provider]  diazepam  (VALIUM ) 5 MG tablet Place 10 mg vaginally every 8 (eight) hours as needed for muscle spasms.   Yes [provider]  diazepam  (VALIUM ) 5 MG tablet Take 5 mg by mouth daily as needed (Menieres disease).   Yes [provider]  diphenhydrAMINE   (BENADRYL ) 25 MG tablet Take 12.5 mg by mouth at bedtime as needed for itching or sleep.   Yes [provider]  estradiol  (ESTRACE ) 0.01 % CREA vaginal cream Place 1 Applicatorful vaginally 3 (three) times a week. 09/07/23 09/20/24 Yes [provider]  hydrocortisone (PROCTOSOL HC) 2.5 % rectal cream Place 1 application rectally 2 (two) times daily.   Yes [provider]  linagliptin  (TRADJENTA ) 5 MG TABS tablet Take 5 mg by mouth daily. 08/05/21  Yes [provider]  magnesium  oxide (MAG-OX) 400 MG tablet Take 400 mg by mouth daily.   Yes [provider]  Mult Vit w/ Min & D-Mannose (AZO URINARY HEALTH) PACK Take by mouth. As needed   Yes [provider]  ondansetron  (ZOFRAN  ODT) 4 MG disintegrating tablet 4mg  ODT q6 hours prn nausea/vomit 10/15/12  Yes Zammit, Joseph, MD  traMADol  (ULTRAM ) 50 MG tablet Take 1 tablet (50 mg total) by mouth as needed. 01/18/23  Yes Towana Ozell BROCKS, MD  triamterene -hydrochlorothiazide  (DYAZIDE ) 37.5-25 MG capsule Take 1 capsule by mouth daily. 04/23/20  Yes [provider]  Glucosamine Sulfate 750 MG TABS Take 2 tablets by mouth daily.    [provider]    Physical Exam: BP (!) 129/90 (BP Location: Right Arm)   Pulse 86   Temp 97.9 F (36.6 C) (Oral)   Resp 19   Ht 5' 3 (1.6 m)   Wt 72.1 kg   LMP 10/01/2012   SpO2 100%   BMI 28.17 kg/m   Patient was not examined.         Recent Labs and Imaging Reviewed:  Basic Metabolic Panel: Recent Labs  Lab 03/20/24 1327  NA 140  K 3.3*  CL 103  CO2 25  GLUCOSE 201*  BUN 6  CREATININE 0.75  CALCIUM  9.5   Liver Function Tests: Recent Labs  Lab 03/20/24 1327  AST 28  ALT 29  ALKPHOS 64  BILITOT 0.9  PROT 8.3*  ALBUMIN 4.7   No results for input(s): LIPASE, AMYLASE in the last 168 hours. No results for input(s): AMMONIA in the last 168 hours. CBC: Recent Labs  Lab 03/20/24 1327  WBC 6.9  NEUTROABS 5.1  HGB 14.8   HCT 43.3  MCV 92.3  PLT 380   Cardiac Enzymes: No results for input(s): CKTOTAL, CKMB, CKMBINDEX, TROPONINI in the last 168 hours.  BNP (last 3 results) No results for input(s): BNP in the last 8760 hours.  ProBNP (last 3 results) No results for input(s): PROBNP in the last 8760 hours.  CBG: No results for input(s): GLUCAP in the last 168 hours.  Radiological Exams on Admission: No results found.  Summary and Recommendations: Sara Briggs is a 58 y.o. 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  She has high tone pelvic floor dysfunction, and has urinary retention in the hospital today.  In-N-Out catheterization was performed, with 650 cc of clear yellow urine obtained.  Discussed over the phone with Camelia Lukes, PA as well as with the psych team via secure chat and made the following recommendations: -Bladder scan 4 times daily -Intermittent catheterization if retaining greater than 400 cc -Check urinalysis -Check BMP and hold nephrotoxins if creatinine is rising  TR[facility] will follow peripherally for now, please call with any further questions.  Time spent: 35 minutes  Ceonna Frazzini CHRISTELLA Gail MD [facility] Pager [phone]  If 7PM-7AM, please contact night-coverage [email or web address] Password [facility]  03/21/2024, 5:49 PM

## 2024-03-21 NOTE — Progress Notes (Signed)
 Patient agitated, hyperverbal with pressured speech and restless in bed. Patient somewhat disorganized with her speech. Patient refused oral medications stating she was unable to take them due to being to thirsty despite having consumed multiple bottles of water in the past few minutes. IM Zyprexa  given for agitation; will continue to monitor.

## 2024-03-21 NOTE — Group Note (Signed)
 Recreation Therapy Group Note   Group Topic:Emotion Expression  Group Date: 03/21/2024 Start Time: 1015 End Time: 1040 Facilitators: Celestia Jeoffrey BRAVO, LRT, CTRS Location: Craft Room  Group Description: Gratitude Journaling. Patients and LRT discussed what gratitude means, how we can express it and what it means to us , personally. LRT gave an educational handout on the definition of gratitude as well as the benefits that come from it. LRT played soft music while everyone picked two or three prompts to write about. Once patients were finished, LRT encouraged people to read their journal prompt if they wanted to; or share which prompt they chose to write about. LRT and pts talked about the benefits of journaling and how it can be used as a positive coping skill. LRT and pts processed how showing gratitude towards themselves, and others can be applied to everyday life post-discharge. LRT offered journals and folders to pts afterwards.   Goal Area(s) Addressed:  Patient will identify the definition of gratitude. Patient will learn different gratitude exercises. Patient will practice writing/journaling as a coping skill.    Affect/Mood: N/A   Participation Level: Did not attend    Clinical Observations/Individualized Feedback: Patient did not attend group.   Plan: Continue to engage patient in RT group sessions 2-3x/week.   Jeoffrey BRAVO Celestia, LRT, CTRS 03/21/2024 12:01 PM

## 2024-03-21 NOTE — [facility] Suicide Risk Assessment (Addendum)
 Sara Briggs Admission Suicide Risk Assessment   Nursing information obtained from:  [name]:  Caucasian, Unemployed Current Mental Status:  NA Loss Factors:  NA Historical Factors:  NA Risk Reduction Factors:  Sense of responsibility to family, Religious beliefs about death, Living with another person, especially a relative, Positive social support, Positive therapeutic relationship, Positive coping skills or problem solving skills  Total Time spent with patient: 30 minutes Principal Problem: Psychosis (HCC) Diagnosis:  Principal Problem:   Psychosis (HCC)  Subjective Data: [name] who presented with bizarre behavior, mood instability, paranoia, and possible manic episodes.  No significant psychiatric history was identified.  [name].  Patient is admitted to the adult inpatient unit with every 15-minute safety monitoring.  Multidisciplinary team approach is offered.  Medication management, group/milieu therapy is offered.  Continued Clinical Symptoms:  Alcohol Use Disorder Identification Test Final Score (AUDIT): 0 The Alcohol Use Disorders Identification Test, Guidelines for Use in Primary Care, Second Edition.  World Science Writer Sara Briggs). Score between 0-7:  no or low risk or alcohol related problems. Score between 8-15:  moderate risk of alcohol related problems. Score between 16-19:  high risk of alcohol related problems. Score 20 or above:  warrants further diagnostic evaluation for alcohol dependence and treatment.   CLINICAL FACTORS:   Currently Psychotic   Musculoskeletal: Strength & Muscle Tone: within normal limits Gait & Station: normal Patient leans: N/A  Psychiatric Specialty Exam:  Presentation  General Appearance:  Appropriate for Environment  Eye Contact: Fair  Speech: Clear and Coherent  Speech Volume: Normal  Handedness: Right   Mood and Affect   Mood: Labile  Affect: Congruent   Thought Process  Thought Processes: Disorganized  Descriptions of Associations:Tangential  Orientation:Full (Time, Place and Person)  Thought Content:Illogical  History of Schizophrenia/Schizoaffective disorder:No  Duration of Psychotic Symptoms:Less than six months  Hallucinations:Hallucinations: None Description of Auditory Hallucinations: Can hear people talking Description of Visual Hallucinations: picture on wall moving  Ideas of Reference:None  Suicidal Thoughts:Suicidal Thoughts: No  Homicidal Thoughts:Homicidal Thoughts: No   Sensorium  Memory: Immediate Fair; Recent Fair  Judgment: Fair  Insight: Fair   Art Therapist  Concentration: Fair  Attention Span: Fair  Recall: [employer] of Knowledge: Fair  Language: Fair   Psychomotor Activity  Psychomotor Activity: Psychomotor Activity: Normal   Assets  Assets: Desire for Improvement; Housing; Social Support   Sleep  Sleep: Sleep: Fair    Physical Exam: Physical Exam ROS Blood pressure (!) 150/94, pulse 82, temperature 97.9 F (36.6 C), temperature source Oral, resp. rate 18, height 5' 3 (1.6 m), weight 72.1 kg, last menstrual period 10/01/2012, SpO2 100%. Body mass index is 28.17 kg/m.   COGNITIVE FEATURES THAT CONTRIBUTE TO RISK:  None    SUICIDE RISK:  Minimal: No identifiable suicidal ideation.  [name] but with morbid ruminations; may be classified as minimal risk based on the severity of the depressive symptoms  PLAN OF CARE: Patient is admitted to the adult inpatient unit with every 15-minute safety monitoring.  Multidisciplinary team approach is offered.  Medication management, group/milieu therapy is offered.  I certify that inpatient services furnished can reasonably be expected to improve the patient's condition.   Sara LITTIE Lukes, PA-C 03/21/2024, 11:21 PM

## 2024-03-21 NOTE — Progress Notes (Signed)
 Patient reporting that she is having a difficult time urinating. Patient subjectively seems more confused with rambling speech. Patient reporting lower abdominal pain. Bladder scan completed with over 658 mL of urine in the bladder. PA notified and consult placed.

## 2024-03-21 NOTE — Group Note (Deleted)
 Recreation Therapy Group Note   Group Topic:Emotion Expression  Group Date: 03/21/2024 Start Time: 1015 End Time: 1050 Facilitators: Celestia Jeoffrey BRAVO, LRT Location: Craft Room       Affect/Mood: {RT [facility] Affect/Mood:26271}   Participation Level: {RT [facility] Participation [employer]   Participation Quality: {RT [facility] Participation Quality:26268}   Behavior: {RT [facility] Group Behavior:26269}   Speech/Thought Process: {RT [facility] Speech/Thought:26276}   Insight: {RT [facility] Insight:26272}   Judgement: {RT [facility] Judgement:26278}   Modes of Intervention: {RT [facility] Modes of Intervention:26277}   Patient Response to Interventions:  {RT [facility] Patient Response to Intervention:26274}   Education Outcome:  {RT [facility] Education Outcome:26279}   Clinical Observations/Individualized Feedback: *** was *** in their participation of session activities and group discussion. Pt identified ***   Plan: {RT [facility] Tx Eojw:73719}   Jeoffrey BRAVO Celestia, LRT,  03/21/2024 11:27 AM

## 2024-03-21 NOTE — Group Note (Signed)
 LCSW Group Therapy Note  Group Date: 03/21/2024 Start Time: 1315 End Time: 1400   Type of Therapy and Topic:  Group Therapy - How To Cope with Nervousness about Discharge   Participation Level:  Did Not Attend   Description of Group This process group involved identification of patients' feelings about discharge. Some of them are scheduled to be discharged soon, while others are new admissions, but each of them was asked to share thoughts and feelings surrounding discharge from the hospital. One common theme was that they are excited at the prospect of going home, while another was that many of them are apprehensive about sharing why they were hospitalized. Patients were given the opportunity to discuss these feelings with their peers in preparation for discharge.  Therapeutic Goals  Patient will identify their overall feelings about pending discharge. Patient will think about how they might proactively address issues that they believe will once again arise once they get home (i.e. with parents). Patients will participate in discussion about having hope for change.   Summary of Patient Progress:  X   Therapeutic Modalities Cognitive Behavioral Therapy   Sherryle JINNY Margo, LCSW 03/21/2024  2:00 PM

## 2024-03-21 NOTE — Group Note (Signed)
 Date:  03/21/2024 Time:  8:39 PM  Group Topic/Focus:  Activity Group: The focus of the group is to promote activity for the patients and encourage them to go outside to the courtyard and get some fresh air and some exercise.    Participation Level:  Did Not Attend   Camellia HERO Dann Galicia 03/21/2024, 8:39 PM

## 2024-03-21 NOTE — Plan of Care (Signed)

## 2024-03-21 NOTE — Group Note (Signed)
 Date:  03/21/2024 Time:  10:37 AM  Group Topic/Focus:  Goals Group:   The focus of this group is to help patients establish daily goals to achieve during treatment and discuss how the patient can incorporate goal setting into their daily lives to aide in recovery.    Participation Level:  Did Not Attend   Sara Briggs 03/21/2024, 10:37 AM
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
## 2024-03-21 NOTE — Progress Notes (Signed)
 Pt extrememly agitated.  Talking and moving incessantly.  Many attempts made to redirect her attention.  See [facility]
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
## 2024-03-22 ENCOUNTER — Inpatient Hospital Stay

## 2024-03-22 ENCOUNTER — Observation Stay

## 2024-03-22 DIAGNOSIS — F451 Undifferentiated somatoform disorder: Secondary | ICD-10-CM | POA: Diagnosis present

## 2024-03-22 DIAGNOSIS — R410 Disorientation, unspecified: Secondary | ICD-10-CM

## 2024-03-22 DIAGNOSIS — F22 Delusional disorders: Secondary | ICD-10-CM

## 2024-03-22 LAB — GLUCOSE, CAPILLARY: Glucose-Capillary: 216 mg/dL — ABNORMAL HIGH (ref 70–99)

## 2024-03-22 MED ORDER — DIAZEPAM 5 MG PO TABS: 10.0000 mg | ORAL_TABLET | Freq: Three times a day (TID) | ORAL | Status: DC | PRN

## 2024-03-22 MED ORDER — DIAZEPAM 2 MG PO TABS
2.0000 mg | ORAL_TABLET | Freq: Three times a day (TID) | ORAL | Status: DC
  Administered 2024-03-22 – 2024-03-25 (×9): 2 mg via ORAL
  Filled 2024-03-22 (×9): qty 1

## 2024-03-22 MED ORDER — IOHEXOL 300 MG/ML  SOLN
100.0000 mL | Freq: Once | INTRAMUSCULAR | Status: AC | PRN
  Administered 2024-03-22: 100 mL via INTRAVENOUS

## 2024-03-22 MED ORDER — INSULIN ASPART 100 UNIT/ML IJ SOLN
0.0000 [IU] | Freq: Every day | INTRAMUSCULAR | Status: DC
  Administered 2024-03-22: 3 [IU] via SUBCUTANEOUS
  Administered 2024-03-23: 2 [IU] via SUBCUTANEOUS
  Filled 2024-03-22: qty 3
  Filled 2024-03-22: qty 2

## 2024-03-22 MED ORDER — TAMSULOSIN HCL 0.4 MG PO CAPS
0.4000 mg | ORAL_CAPSULE | Freq: Every day | ORAL | Status: DC
  Administered 2024-03-22 – 2024-03-25 (×3): 0.4 mg via ORAL
  Filled 2024-03-22 (×4): qty 1

## 2024-03-22 MED ORDER — INSULIN ASPART 100 UNIT/ML IJ SOLN
0.0000 [IU] | Freq: Three times a day (TID) | INTRAMUSCULAR | Status: DC
  Administered 2024-03-22: 3 [IU] via SUBCUTANEOUS
  Administered 2024-03-23: 5 [IU] via SUBCUTANEOUS
  Administered 2024-03-24: 2 [IU] via SUBCUTANEOUS
  Administered 2024-03-24: 3 [IU] via SUBCUTANEOUS
  Administered 2024-03-24 – 2024-03-25 (×2): 2 [IU] via SUBCUTANEOUS
  Filled 2024-03-22 (×4): qty 3
  Filled 2024-03-22: qty 1

## 2024-03-22 MED ORDER — RISPERIDONE 1 MG PO TABS
1.0000 mg | ORAL_TABLET | Freq: Two times a day (BID) | ORAL | Status: DC
  Administered 2024-03-22 – 2024-03-25 (×5): 1 mg via ORAL
  Filled 2024-03-22 (×6): qty 1

## 2024-03-22 NOTE — Group Note (Signed)
 Date:  03/22/2024 Time:  10:10 AM  Group Topic/Focus:  Goals Group:   The focus of this group is to help patients establish daily goals to achieve during treatment and discuss how the patient can incorporate goal setting into their daily lives to aide in recovery.    Participation Level:  Did Not Attend   Sara Briggs 03/22/2024, 10:10 AM

## 2024-03-22 NOTE — Consult Note (Signed)
 NEUROLOGY CONSULT NOTE   Date of service: March 22, 2024 Patient Name: Sara Briggs MRN:  [id number] DOB:  29-Sep-1965 Chief Complaint: altered mental status Requesting Provider: Donnelly Mellow, MD  History of Present Illness  Sara Briggs is a 58 y.o. female with hx of Mnire's disease who underwent  Of note, she had an episode of hallucinations a while after surgery in September that then improved prior to her most recent surgery.  Following her most recent surgery, she was seen at [facility] ER for headache, started on antibiotics for possible mastoid effusion, but this was not felt to be likely infected by ENT.  It is unclear how long she was on antibiotics for, and I have tried to reach out to her husband for ancillary history, but have not gotten a hold of him yet.  Today, she describes that she has been having headaches since her surgery, they are left temporal, and extend to the retro-orbital area.  She states that they are highly correlated to stress, and if someone is talking to her and saying something stressful, the headache will continue but if they stop talking to her, then the headache will resolve.  History is limited from the patient by her highly distractible and disorganized thinking.  For instance when I asked her if she had confusion after her previous surgery, she starts telling me about a trip to the beach and how she felt very unsafe because Bebe Mulders had been killed just prior to that, and how would whenever know who is safe when even Charlie kirk can be killed.   Past History   Past Medical History:  Diagnosis Date   Allergic    Allergy    Cancer (HCC)    skin cancer, basal cell carcinoma   Diabetes mellitus without complication (HCC)    Frequent headaches    Hearing loss    HOH (hard of hearing)    Hyperlipidemia    IBS (irritable bowel syndrome)    Meniere's disease    Obesity    Rectocele    Tinnitus     Past Surgical History:  Procedure  Laterality Date   btl     CESAREAN SECTION     LAPAROSCOPY FOR ECTOPIC PREGNANCY      Family History: Family History  Problem Relation Age of Onset   Colon cancer Maternal Uncle    Colon cancer Maternal Grandfather    Diabetes Mother    Hypertension Mother    Hypertension Father    Sleep apnea Brother    Hyperlipidemia Brother    Heart disease Maternal Grandmother    Hypertension Paternal Grandmother    Hyperlipidemia Paternal Grandmother    CVA Paternal Grandfather    Hypertension Paternal Grandfather    Hyperlipidemia Paternal Grandfather    Sleep apnea Brother    Hearing loss Brother    Leukemia Paternal Uncle    Prostate cancer Maternal Uncle    Lung cancer Maternal Aunt     Social History  reports that she has never smoked. She has never used smokeless tobacco. She reports that she does not drink alcohol and does not use drugs.  Allergies  Allergen Reactions   Apple Juice Swelling and Rash    Mouth and tongue swelling   Ancef [Cefazolin]    Metformin And Related Nausea Only   Onglyza [Saxagliptin] Other (See Comments)    Change in taste   Tape Itching    takes off skin   Percocet [Oxycodone -Acetaminophen ] Rash  Sulfa Antibiotics Rash   Vicodin [Hydrocodone -Acetaminophen ] Rash    Medications   Current Facility-Administered Medications:    alum & mag hydroxide-simeth (MAALOX/MYLANTA) 200-200-20 MG/5ML suspension 30 mL, 30 mL, Oral, Q4H PRN, McLauchlin, Angela, NP   amoxicillin  (AMOXIL ) capsule 500 mg, 500 mg, Oral, TID, McLauchlin, Angela, NP, 500 mg at 03/22/24 0903   calcium  citrate (CALCITRATE - dosed in mg elemental calcium ) tablet 475 mg, 475 mg, Oral, BID, Hunter, Crystal L, PA-C, 475 mg at 03/22/24 9096   diazepam  (VALIUM ) tablet 2 mg, 2 mg, Oral, Q8H, Hunter, Crystal L, PA-C   estradiol  (ESTRACE ) 0.01 % vaginal cream 1 Applicatorful, 1 Applicatorful, Vaginal, Once per day on Monday Wednesday Friday, Hunter, [employer], PA-C, 1 Applicatorful at  03/22/24 9095   ibuprofen  (ADVIL ) tablet 600 mg, 600 mg, Oral, Q6H PRN, Jadapalle, Sree, MD, 600 mg at 03/21/24 1150   insulin aspart (novoLOG) injection 0-5 Units, 0-5 Units, Subcutaneous, QHS, Jadapalle, Sree, MD   insulin aspart (novoLOG) injection 0-9 Units, 0-9 Units, Subcutaneous, TID WC, Jadapalle, Sree, MD   linagliptin  (TRADJENTA ) tablet 5 mg, 5 mg, Oral, Daily, Hunter, Crystal L, PA-C, 5 mg at 03/22/24 9096   magnesium  hydroxide (MILK OF MAGNESIA) suspension 30 mL, 30 mL, Oral, Daily PRN, McLauchlin, Angela, NP   OLANZapine  (ZYPREXA ) injection 5 mg, 5 mg, Intramuscular, TID PRN, McLauchlin, Angela, NP, 5 mg at 03/21/24 1819   OLANZapine  zydis (ZYPREXA ) disintegrating tablet 5 mg, 5 mg, Oral, TID PRN, McLauchlin, Angela, NP   potassium chloride  SA (KLOR-CON  M) CR tablet 20 mEq, 20 mEq, Oral, Once, Hunter, Crystal L, PA-C   risperiDONE  (RISPERDAL ) tablet 1 mg, 1 mg, Oral, BID, Hunter, Crystal L, PA-C   tamsulosin (FLOMAX) capsule 0.4 mg, 0.4 mg, Oral, Daily, Sreenath, Sudheer B, MD   traMADol  (ULTRAM ) tablet 50 mg, 50 mg, Oral, Q6H PRN, Jadapalle, Sree, MD, 50 mg at 03/22/24 0153   triamterene -hydrochlorothiazide  (MAXZIDE -25) 37.5-25 MG per tablet 1 tablet, 1 tablet, Oral, Daily, Hunter, Crystal L, PA-C, 1 tablet at 03/22/24 0903  Vitals   Vitals:   03/20/24 1900 03/21/24 0630 03/21/24 1825 03/22/24 0900  BP: 102/73 (!) 129/90 (!) 150/94 (!) 122/102  Pulse: 96 86 82 (!) 102  Resp: 16 19 18 18   Temp: (!) 97.2 F (36.2 C) 97.9 F (36.6 C)  98.2 F (36.8 C)  TempSrc: Oral Oral  Oral  SpO2: 99% 100% 100% 97%  Weight: 72.1 kg     Height: 5' 3 (1.6 m)       Body mass index is 28.17 kg/m.   Physical Exam   Constitutional: Appears well-developed and well-nourished.   Neurologic Examination    Neuro: Mental Status: Patient is awake, alert, oriented to person, place, month, year, and situation. Patient is able to give a clear and coherent history. No signs of aphasia or  neglect She is able to spell world backwards without difficulty Cranial Nerves: II: Visual Fields are full. Pupils are equal, round, and reactive to light.   III,IV, VI: EOMI without ptosis or diploplia.  V: Facial sensation is symmetric to temperature VII: Facial movement is symmetric.  VIII: hearing is intact to voice X: Uvula elevates symmetrically XII: tongue is midline without atrophy or fasciculations.  Motor: Tone is normal. Bulk is normal. 5/5 strength was present in all four extremities.  Sensory: Sensation is symmetric to light touch and temperature in the arms and legs. Cerebellar: FNF intact bilaterally     Labs/Imaging/Neurodiagnostic studies   CBC:  Recent Labs  Lab 03/20/24 1327  WBC 6.9  NEUTROABS 5.1  HGB 14.8  HCT 43.3  MCV 92.3  PLT 380   Basic Metabolic Panel:  Lab Results  Component Value Date   NA 139 03/21/2024   K 3.4 (L) 03/21/2024   CO2 23 03/21/2024   GLUCOSE 207 (H) 03/21/2024   BUN 11 03/21/2024   CREATININE 0.83 03/21/2024   CALCIUM  9.3 03/21/2024   GFRNONAA >60 03/21/2024   GFRAA >60 03/11/2019   Lipid Panel: No results found for: LDLCALC HgbA1c:  Lab Results  Component Value Date   HGBA1C 8.4 (H) 03/20/2024    ASSESSMENT   Sara Briggs is a 58 y.o. female with confusion, paranoia, hallucinations in the setting of recent surgery.  Some of the components of her description do sound like they could be consistent with delirium, but is odd for there to be a delay between the surgery and development of this.  She does not currently have signs of working memory impairment, which would go against delirium as well.  With no evidence of infection, and no meningismus, I do not think meningitis is at all likely.  She does not even currently have a headache, though my suspicion is that she does have migraines and her recurrent headaches now may in fact be migraine.  With bilateral cochlear implants, I would expect significant artifact on  MRI, and likely will be on CT as well, but this still might be helpful.  R1ECOMMENDATIONS  CT head with and without EEG If these are normal, I would treat her symptomatically as you are already doing. ______________________________________________________________________    Signed, Aisha Seals, MD [facility]

## 2024-03-22 NOTE — BHH Counselor (Signed)
 CSW attempted to contact the following [facility] at [phone] or [facility] at [phone].  CSW left HIPAA compliant voicemail with both.   Numbers were obtained from Interpreter Portal.  Nursing is reaching out to [facility] for further guidance on what can be offered to the patient.   [name], MSW, LCSW 03/22/2024 3:56 PM

## 2024-03-22 NOTE — Group Note (Deleted)
 Recreation Therapy Group Note   Group Topic:Leisure Education  Group Date: 03/22/2024 Start Time: 1120 End Time: 1200 Facilitators: Celestia Jeoffrey BRAVO, LRT Location: Craft Room  Group Description: Leisure. Patients were given the option to choose from journaling, coloring, drawing, making origami, playing with playdoh, listening to music or singing karaoke. LRT and pts discussed the meaning of leisure, the importance of participating in leisure during their free time/when they're outside of the hospital, as well as how our leisure interests can also serve as coping skills.   Goal Area(s) Addressed:  Patient will identify a current leisure interest.  Patient will learn the definition of "leisure". Patient will practice making a positive decision. Patient will have the opportunity to try a new leisure activity. Patient will communicate with peers and LRT.      Affect/Mood: {RT BHH Affect/Mood:26271}   Participation Level: {RT BHH Participation Level:26267}   Participation Quality: {RT BHH Participation Quality:26268}   Behavior: {RT BHH Group Behavior:26269}   Speech/Thought Process: {RT BHH Speech/Thought:26276}   Insight: {RT BHH Insight:26272}   Judgement: {RT BHH Judgement:26278}   Modes of Intervention: {RT BHH Modes of Intervention:26277}   Patient Response to Interventions:  {RT BHH Patient Response to Intervention:26274}   Education Outcome:  {RT BHH Education Outcome:26279}   Clinical Observations/Individualized Feedback: *** was *** in their participation of session activities and group discussion. Pt identified ***   Plan: {RT BHH Tx Eojw:73719}   Jeoffrey BRAVO Celestia, LRT,  03/22/2024 1:30 PM

## 2024-03-22 NOTE — BHH Suicide Risk Assessment (Signed)
 BHH INPATIENT:  Family/Significant Other Suicide Prevention Education  Suicide Prevention Education:  Education Completed; Olevia Westervelt, husband, [phone],  (name of family member/significant other) has been identified by the patient as the family member/significant other with whom the patient will be residing, and identified as the person(s) who will aid the patient in the event of a mental health crisis (suicidal ideations/suicide attempt).  With written consent from the patient, the family member/significant other has been provided the following suicide prevention education, prior to the and/or following the discharge of the patient.  The suicide prevention education provided includes the following: Suicide risk factors Suicide prevention and interventions National Suicide Hotline telephone number [facility] assessment telephone number [facility] Emergency Assistance 911 [facility] and/or Residential Mobile Crisis Unit telephone number  Request made of family/significant other to: Remove weapons (e.g., guns, rifles, knives), all items previously/currently identified as safety concern.   Remove drugs/medications (over-the-counter, prescriptions, illicit drugs), all items previously/currently identified as a safety concern.  The family member/significant other verbalizes understanding of the suicide prevention education information provided.  The family member/significant other agrees to remove the items of safety concern listed above.  Sherryle JINNY Margo 03/22/2024, 3:46 PM

## 2024-03-22 NOTE — Plan of Care (Signed)
  Problem: Activity: Goal: Interest or engagement in activities will improve Outcome: Progressing Goal: Sleeping patterns will improve Outcome: Progressing   Problem: Coping: Goal: Ability to verbalize frustrations and anger appropriately will improve Outcome: Progressing Goal: Ability to demonstrate self-control will improve Outcome: Progressing   Problem: Education: Goal: Knowledge of [facility] Education information/materials will improve Outcome: Progressing Goal: Emotional status will improve Outcome: Progressing Goal: Mental status will improve Outcome: Progressing Goal: Verbalization of understanding the information provided will improve Outcome: Progressing   

## 2024-03-22 NOTE — Inpatient Diabetes Management (Signed)
 Inpatient Diabetes Program Recommendations  AACE/ADA: New Consensus Statement on Inpatient Glycemic Control (2015)  Target Ranges:  Prepandial:   less than 140 mg/dL      Peak postprandial:   less than 180 mg/dL (1-2 hours)      Critically ill patients:  140 - 180 mg/dL    Latest Reference Range & Units 03/20/24 13:27  Hemoglobin A1C 4.8 - 5.6 % 8.4 (H)  194 mg/dl  (H): Data is abnormally high   Latest Reference Range & Units 03/21/24 18:32  Glucose-Capillary 70 - 99 mg/dL 813 (H)  (H): Data is abnormally high    Admit with: Manic / Paranoid behavior   History: DM2  Home DM Meds: Tradjenta  5 mg daily  Current Orders: Tradjenta  5 mg daily    MD- Please consider adding orders for CBG checks TID AC + HS  Please also add Novolog Sensitive Correction Scale/ SSI (0-9 units) TID AC + HS     --Will follow patient during hospitalization--  Adina Rudolpho Arrow RN, MSN, CDCES Diabetes Coordinator Inpatient Glycemic Control Team Team Pager: [phone] (8a-5p)

## 2024-03-22 NOTE — BHH Counselor (Signed)
 CSW spoke to Sara Briggs, husband, [phone] with Crystal PA present.   Husband requests that wife has access to some sort of captioning device to assist her with hearing and knowing what is being discussed.  CSW explained that pt has been reading lips and requesting that people repeat questions and doing well, however, will look into what service can be provided.   He reports that patient is not a danger to self or others.    He reports that pt does not have access to weapons.  He reports that there are weapons in the home though.   He reports that if picking patient up on Sunday he can arrive after church, around 2 or 3pm.   He reports that if picking patient up on Monday, it will need to be after work around 6:30pm or 7pm.  [name], MSW, LCSW 03/22/2024 3:54 PM

## 2024-03-22 NOTE — Progress Notes (Signed)
 Eeg done

## 2024-03-22 NOTE — Progress Notes (Signed)
 Brief hospitalist consult follow-up  HPI: Sara Briggs is a 58 y.o. 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  I was contacted for medical consultation by the psychiatric team, as the patient is experiencing symptomatic urinary retention.  She complained of lower pelvic discomfort, bladder scan was performed with 658 mL of urine found to be in the bladder.  Hospitalist was contacted for recommendations.   On further review of the patient's chart, she is followed by gynecology Dr. Sherida and surgical repair of cystocele with rectocele in July 2025, after which she developed difficulty with intermittent urinary retention.  She was diagnosed by her urologist with high tone pelvic floor dysfunction and there was discussion about performing intermittent self-catheterization at home, and she was also prescribed PT.  It seems that she did not start PT yet.  Recommendations:  Patient urinary retention is chronic in nature, followed by outpatient OB/GYN.  Felt to be secondary to high tone pelvic floor dysfunction.  Fortunately her urinalysis is inconsistent with UTI and she has no evidence of acute kidney injury  Bladder scans 4 times daily In-N-Out catheterization if postvoid residual greater than 400 cc Flomax 0.4 mg daily Intravaginal Valium  10 mg every 8 hours as needed Ambulation/exertion as tolerated Discussed with PT.  Unfortunately we do not have a pelvic therapist on inpatient staff.  This will require outpatient follow-up.  It seems that the patient was referred to outpatient physical therapy by her gynecologist   Calvin Robson MD [facility]  No charge

## 2024-03-22 NOTE — BH IP Treatment Plan (Signed)
 Interdisciplinary Treatment and Diagnostic Plan Update  03/22/2024 Time of Session: 10:36 Sara Briggs MRN: [id number]  Principal Diagnosis: Psychosis [facility])  Secondary Diagnoses: Principal Problem:   Psychosis (HCC)   Current Medications:  Current Facility-Administered Medications  Medication Dose Route Frequency Provider Last Rate Last Admin   alum & mag hydroxide-simeth (MAALOX/MYLANTA) 200-200-20 MG/5ML suspension 30 mL  30 mL Oral Q4H PRN McLauchlin, Angela, NP       amoxicillin  (AMOXIL ) capsule 500 mg  500 mg Oral TID McLauchlin, Angela, NP   500 mg at 03/22/24 1151   calcium  citrate (CALCITRATE - dosed in mg elemental calcium ) tablet 475 mg  475 mg Oral BID Hunter, Crystal L, PA-C   475 mg at 03/22/24 9096   diazepam  (VALIUM ) tablet 2 mg  2 mg Oral Q8H Hunter, Crystal L, PA-C       estradiol  (ESTRACE ) 0.01 % vaginal cream 1 Applicatorful  1 Applicatorful Vaginal Once per day on Monday Wednesday Friday Hunter, Crystal L, PA-C   1 Applicatorful at 03/22/24 0904   ibuprofen  (ADVIL ) tablet 600 mg  600 mg Oral Q6H PRN Jadapalle, Sree, MD   600 mg at 03/21/24 1150   insulin aspart (novoLOG) injection 0-5 Units  0-5 Units Subcutaneous QHS Jadapalle, Sree, MD       insulin aspart (novoLOG) injection 0-9 Units  0-9 Units Subcutaneous TID WC Jadapalle, Sree, MD   3 Units at 03/22/24 1150   linagliptin  (TRADJENTA ) tablet 5 mg  5 mg Oral Daily Hunter, Crystal L, PA-C   5 mg at 03/22/24 9096   magnesium  hydroxide (MILK OF MAGNESIA) suspension 30 mL  30 mL Oral Daily PRN McLauchlin, Angela, NP       OLANZapine  (ZYPREXA ) injection 5 mg  5 mg Intramuscular TID PRN McLauchlin, Angela, NP   5 mg at 03/21/24 1819   OLANZapine  zydis (ZYPREXA ) disintegrating tablet 5 mg  5 mg Oral TID PRN McLauchlin, Angela, NP       potassium chloride  SA (KLOR-CON  M) CR tablet 20 mEq  20 mEq Oral Once Hunter, Crystal L, PA-C       risperiDONE  (RISPERDAL ) tablet 1 mg  1 mg Oral BID Hunter, Crystal L, PA-C        tamsulosin (FLOMAX) capsule 0.4 mg  0.4 mg Oral Daily Sreenath, Sudheer B, MD       traMADol  (ULTRAM ) tablet 50 mg  50 mg Oral Q6H PRN Jadapalle, Sree, MD   50 mg at 03/22/24 0153   triamterene -hydrochlorothiazide  (MAXZIDE -25) 37.5-25 MG per tablet 1 tablet  1 tablet Oral Daily Hunter, Crystal L, PA-C   1 tablet at 03/22/24 9096   PTA Medications: Medications Prior to Admission  Medication Sig Dispense Refill Last Dose/Taking   Acetaminophen  (TYLENOL  PO) Take by mouth. As needed   Unknown   Betahistine HCl POWD 24 mg by Does not apply route 2 (two) times daily.   03/19/2024   calcium  citrate (CALCITRATE - DOSED IN MG ELEMENTAL CALCIUM ) 950 (200 Ca) MG tablet Take 500 mg by mouth 2 (two) times daily.   03/19/2024   Cranberry-D Mannose 500-50 MG CHEW Chew 1,000 mg by mouth 2 (two) times daily.   03/19/2024   diazepam  (VALIUM ) 5 MG tablet Place 10 mg vaginally every 8 (eight) hours as needed for muscle spasms.   03/16/2024   diazepam  (VALIUM ) 5 MG tablet Take 5 mg by mouth daily as needed (Menieres disease).   Unknown   diphenhydrAMINE  (BENADRYL ) 25 MG tablet Take 12.5 mg by mouth at  bedtime as needed for itching or sleep.   03/19/2024   estradiol  (ESTRACE ) 0.01 % CREA vaginal cream Place 1 Applicatorful vaginally 3 (three) times a week.   Past Week   hydrocortisone (PROCTOSOL HC) 2.5 % rectal cream Place 1 application rectally 2 (two) times daily.   Unknown   linagliptin  (TRADJENTA ) 5 MG TABS tablet Take 5 mg by mouth daily.   03/19/2024   magnesium  oxide (MAG-OX) 400 MG tablet Take 400 mg by mouth daily.   03/19/2024   Mult Vit w/ Min & D-Mannose (AZO URINARY HEALTH) PACK Take by mouth. As needed   Unknown   ondansetron  (ZOFRAN  ODT) 4 MG disintegrating tablet 4mg  ODT q6 hours prn nausea/vomit 12 tablet 0 Unknown   traMADol  (ULTRAM ) 50 MG tablet Take 1 tablet (50 mg total) by mouth as needed. 15 tablet 0 Unknown   triamterene -hydrochlorothiazide  (DYAZIDE ) 37.5-25 MG capsule Take 1 capsule by mouth  daily.   03/19/2024   [EXPIRED] amoxicillin  (AMOXIL ) 500 MG capsule Take 500 mg by mouth 3 (three) times daily.      Glucosamine Sulfate 750 MG TABS Take 2 tablets by mouth daily.   03/19/2024    Patient Stressors:    Patient Strengths:    Treatment Modalities: Medication Management, Group therapy, Case management,  1 to 1 session with clinician, Psychoeducation, Recreational therapy.   Physician Treatment Plan for Primary Diagnosis: Psychosis (HCC) Long Term Goal(s): Improvement in symptoms so as ready for discharge   Short Term Goals: Ability to identify changes in lifestyle to reduce recurrence of condition will improve Ability to verbalize feelings will improve Ability to identify and develop effective coping behaviors will improve Ability to maintain clinical measurements within normal limits will improve  Medication Management: Evaluate patient's response, side effects, and tolerance of medication regimen.  Therapeutic Interventions: 1 to 1 sessions, Unit Group sessions and Medication administration.  Evaluation of Outcomes: Not Met  Physician Treatment Plan for Secondary Diagnosis: Principal Problem:   Psychosis (HCC)  Long Term Goal(s): Improvement in symptoms so as ready for discharge   Short Term Goals: Ability to identify changes in lifestyle to reduce recurrence of condition will improve Ability to verbalize feelings will improve Ability to identify and develop effective coping behaviors will improve Ability to maintain clinical measurements within normal limits will improve     Medication Management: Evaluate patient's response, side effects, and tolerance of medication regimen.  Therapeutic Interventions: 1 to 1 sessions, Unit Group sessions and Medication administration.  Evaluation of Outcomes: Not Met   RN Treatment Plan for Primary Diagnosis: Psychosis (HCC) Long Term Goal(s): Knowledge of disease and therapeutic regimen to maintain health will  improve  Short Term Goals: Ability to remain free from injury will improve, Ability to verbalize frustration and anger appropriately will improve, Ability to demonstrate self-control, Ability to participate in decision making will improve, Ability to verbalize feelings will improve, Ability to disclose and discuss suicidal ideas, Ability to identify and develop effective coping behaviors will improve, and Compliance with prescribed medications will improve  Medication Management: RN will administer medications as ordered by provider, will assess and evaluate patient's response and provide education to patient for prescribed medication. RN will report any adverse and/or side effects to prescribing provider.  Therapeutic Interventions: 1 on 1 counseling sessions, Psychoeducation, Medication administration, Evaluate responses to treatment, Monitor vital signs and CBGs as ordered, Perform/monitor CIWA, COWS, AIMS and Fall Risk screenings as ordered, Perform wound care treatments as ordered.  Evaluation of Outcomes: Not Met  LCSW Treatment Plan for Primary Diagnosis: Psychosis (HCC) Long Term Goal(s): Safe transition to appropriate next level of care at discharge, Engage patient in therapeutic group addressing interpersonal concerns.  Short Term Goals: Engage patient in aftercare planning with referrals and resources, Increase social support, Increase ability to appropriately verbalize feelings, Increase emotional regulation, Facilitate acceptance of mental health diagnosis and concerns, and Increase skills for wellness and recovery  Therapeutic Interventions: Assess for all discharge needs, 1 to 1 time with Social worker, Explore available resources and support systems, Assess for adequacy in community support network, Educate family and significant other(s) on suicide prevention, Complete Psychosocial Assessment, Interpersonal group therapy.  Evaluation of Outcomes: Not Met   Progress in  Treatment: Attending groups: No. Participating in groups: No. Taking medication as prescribed: Yes. Toleration medication: Yes. Family/Significant other contact made: No, will contact:  husband, Sara Briggs. Patient understands diagnosis: No. Discussing patient identified problems/goals with staff: Yes. Medical problems stabilized or resolved: Yes. Denies suicidal/homicidal ideation: Yes. Issues/concerns per patient self-inventory: No. Other: none.  New problem(s) identified: No, Describe:  none identified.  New Short Term/Long Term Goal(s): elimination of symptoms of psychosis, medication management for mood stabilization; elimination of SI thoughts; development of comprehensive mental wellness plan.  Patient Goals:  I don't think I need to be here. I think I need more physical help.   Discharge Plan or Barriers: CSW will assist pt with development of an appropriate aftercare/discharge plan.  Reason for Continuation of Hospitalization: Delusions  Hallucinations Mania Medication stabilization  Estimated Length of Stay: 1-7 days  Last 3 [place] Suicide Severity Risk Score: Flowsheet Row Admission (Current) from 03/20/2024 in [facility] INPATIENT BE[facility]INE Most recent reading at 03/20/2024  9:00 PM ED from 03/20/2024 in [facility] Most recent reading at 03/20/2024  3:31 PM ED from 11/09/2023 in [facility] Emergency Department at [facility] Most recent reading at 11/09/2023  8:18 PM  C-SSRS RISK CATEGORY No Risk No Risk No Risk    Last PHQ 2/9 Scores:    03/01/2014   10:21 AM  Depression screen PHQ 2/9  Decreased Interest 0  Down, Depressed, Hopeless 0  PHQ - 2 Score 0    Scribe for Treatment Team: Nadara JONELLE Fam, LCSW 03/22/2024 11:58 AM

## 2024-03-22 NOTE — Progress Notes (Signed)
 PT Cancellation Note  Patient Details Name: ELLAN TESS MRN: [id number] DOB: 08/02/1965   Cancelled Treatment:    Reason Eval/Treat Not Completed:  (Consult received and chart reviewed.  Will consult with pelvic health specialist on Monday to see if there are things we can offer acutely; otherwise, would benefit from outpatient referral to pelvic health PT.)   Carolynne Schuchard H. Delores, PT, DPT, NCS 03/22/24, 1:17 PM [phone]

## 2024-03-22 NOTE — Group Note (Signed)
 Recreation Therapy Group Note   Group Topic:Leisure Education  Group Date: 03/22/2024 Start Time: 1120 End Time: 1200 Facilitators: Celestia Jeoffrey BRAVO, LRT, CTRS Location: Craft Room  Group Description: Leisure. Patients were given the option to choose from journaling, coloring, drawing, making origami, playing with playdoh, listening to music or singing karaoke. LRT and pts discussed the meaning of leisure, the importance of participating in leisure during their free time/when they're outside of the hospital, as well as how our leisure interests can also serve as coping skills.   Goal Area(s) Addressed:  Patient will identify a current leisure interest.  Patient will learn the definition of "leisure". Patient will practice making a positive decision. Patient will have the opportunity to try a new leisure activity. Patient will communicate with peers and LRT.    Affect/Mood: N/A   Participation Level: Did not attend    Clinical Observations/Individualized Feedback: Patient did not attend group.   Plan: Continue to engage patient in RT group sessions 2-3x/week.   Jeoffrey BRAVO Celestia, LRT, CTRS 03/22/2024 1:41 PM

## 2024-03-22 NOTE — Progress Notes (Addendum)
 [facility] MD Progress Note  03/22/2024 10:21 PM Sara Briggs  MRN:  [id number]   Subjective:  Chart reviewed, case discussed in multidisciplinary meeting, patient seen during rounds.   11/28: Patient seen today with supervising physician, Dr. Jadapalle.  Patient is more linear and logical on exam today.  She denies SI/HI/plan and denies hallucinations.  She reports fair sleep and good appetite.  Patient reports previously trialing Xanax for anxiety in 2020 approximately.  Hospitalist continues to follow for urinary retention.  Hospitalist Dr. Jhonny advised may discontinue amoxicillin  due to current urinalysis not consistent with infection.  Patient reports persistent headache and photophobia.  Consult placed to neurology for evaluation of headaches and altered mental status.  Suspect some somatic symptoms and anxiety associated with recent medical issues and caregiver fatigue.  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  Provider and social worker, [name], contacted patient's husband, Saachi Zale, to provide update of patient's progress and treatment plan.  Discussed importance of outpatient follow-up with specialty providers upon hospital discharge, including OB/GYN, psychiatry, therapy, ENT, as well as neurology if indicated.  Discussed caregiver fatigue and available resources. All questions answered.  Patient's husband agreeable to discharge Sunday or Monday.   Past Psychiatric History: see h&P Family History:  Family History  Problem Relation Age of Onset   Colon cancer Maternal Uncle    Colon cancer Maternal Grandfather    Diabetes Mother    Hypertension Mother    Hypertension Father    Sleep apnea Brother    Hyperlipidemia Brother    Heart disease Maternal Grandmother    Hypertension Paternal Grandmother    Hyperlipidemia Paternal Grandmother    CVA Paternal Grandfather    Hypertension Paternal Grandfather    Hyperlipidemia Paternal Grandfather    Sleep apnea  Brother    Hearing loss Brother    Leukemia Paternal Uncle    Prostate cancer Maternal Uncle    Lung cancer Maternal Aunt    Social History:  Social History   Substance and Sexual Activity  Alcohol Use No     Social History   Substance and Sexual Activity  Drug Use No    Social History   Socioeconomic History   Marital status: Married    Spouse name: Not on file   Number of children: Not on file   Years of education: Not on file   Highest education level: Not on file  Occupational History   Occupation: academic librarian  Tobacco Use   Smoking status: Never   Smokeless tobacco: Never  Vaping Use   Vaping status: Never Used  Substance and Sexual Activity   Alcohol use: No   Drug use: No   Sexual activity: Yes    Birth control/protection: Post-menopausal  Other Topics Concern   Not on file  Social History Narrative   Married.     Social Drivers of Corporate Investment Banker Strain: Low Risk  (09/06/2023)   Received from [facility]   Overall Financial Resource Strain (CARDIA)    Difficulty of Paying Living Expenses: Not hard at all  Food Insecurity: No Food Insecurity (03/20/2024)   Hunger Vital Sign    Worried About Running Out of Food in the Last Year: Never true    Ran Out of Food in the Last Year: Never true  Transportation Needs: No Transportation Needs (03/20/2024)   PRAPARE - Administrator, Civil Service (Medical): No    Lack of Transportation (Non-Medical): No  Physical Activity: Insufficiently Active (  09/06/2023)   Received from [facility]   Exercise Vital Sign    On average, how many days per week do you engage in moderate to strenuous exercise (like a brisk walk)?: 2 days    On average, how many minutes do you engage in exercise at this level?: 40 min  Stress: No Stress Concern Present (11/01/2023)   Received from [facility] - Occupational Stress Questionnaire    Feeling of Stress : Not at all   Social Connections: Socially Integrated (09/06/2023)   Received from [facility]   Social Network    How would you rate your social network (family, work, friends)?: Good participation with social networks   Past Medical History:  Past Medical History:  Diagnosis Date   Allergic    Allergy    Cancer (HCC)    skin cancer, basal cell carcinoma   Diabetes mellitus without complication (HCC)    Frequent headaches    Hearing loss    HOH (hard of hearing)    Hyperlipidemia    IBS (irritable bowel syndrome)    Meniere's disease    Obesity    Rectocele    Tinnitus     Past Surgical History:  Procedure Laterality Date   btl     CESAREAN SECTION     LAPAROSCOPY FOR ECTOPIC PREGNANCY      Current Medications: Current Facility-Administered Medications  Medication Dose Route Frequency Provider Last Rate Last Admin   alum & mag hydroxide-simeth (MAALOX/MYLANTA) 200-200-20 MG/5ML suspension 30 mL  30 mL Oral Q4H PRN McLauchlin, Angela, NP       calcium  citrate (CALCITRATE - dosed in mg elemental calcium ) tablet 475 mg  475 mg Oral BID Demtrius Rounds L, PA-C   475 mg at 03/22/24 9096   diazepam  (VALIUM ) tablet 10 mg  10 mg Oral Q8H PRN Jhonny Sahara B, MD       diazepam  (VALIUM ) tablet 2 mg  2 mg Oral Q8H Fredric Slabach L, PA-C   2 mg at 03/22/24 2203   estradiol  (ESTRACE ) 0.01 % vaginal cream 1 Applicatorful  1 Applicatorful Vaginal Once per day on Monday Wednesday Friday Dorthula Bier L, PA-C   1 Applicatorful at 03/22/24 9095   ibuprofen  (ADVIL ) tablet 600 mg  600 mg Oral Q6H PRN Jadapalle, Sree, MD   600 mg at 03/22/24 1534   insulin aspart (novoLOG) injection 0-5 Units  0-5 Units Subcutaneous QHS Jadapalle, Sree, MD   3 Units at 03/22/24 2214   insulin aspart (novoLOG) injection 0-9 Units  0-9 Units Subcutaneous TID WC Jadapalle, Sree, MD   3 Units at 03/22/24 1150   linagliptin  (TRADJENTA ) tablet 5 mg  5 mg Oral Daily Valerie Fredin L, PA-C   5 mg at 03/22/24 9096    magnesium  hydroxide (MILK OF MAGNESIA) suspension 30 mL  30 mL Oral Daily PRN McLauchlin, Angela, NP       OLANZapine  (ZYPREXA ) injection 5 mg  5 mg Intramuscular TID PRN McLauchlin, Angela, NP   5 mg at 03/21/24 1819   OLANZapine  zydis (ZYPREXA ) disintegrating tablet 5 mg  5 mg Oral TID PRN McLauchlin, Angela, NP       potassium chloride  SA (KLOR-CON  M) CR tablet 20 mEq  20 mEq Oral Once Xanthe Couillard L, PA-C       risperiDONE  (RISPERDAL ) tablet 1 mg  1 mg Oral BID Ryver Poblete L, PA-C   1 mg at 03/22/24 2203   tamsulosin (FLOMAX) capsule  0.4 mg  0.4 mg Oral Daily Jhonny Sahara B, MD   0.4 mg at 03/22/24 1535   traMADol  (ULTRAM ) tablet 50 mg  50 mg Oral Q6H PRN Jadapalle, Sree, MD   50 mg at 03/22/24 2035   triamterene -hydrochlorothiazide  (MAXZIDE -25) 37.5-25 MG per tablet 1 tablet  1 tablet Oral Daily Sophiamarie Nease L, PA-C   1 tablet at 03/22/24 9096    Lab Results:  Results for orders placed or performed during the hospital encounter of 03/20/24 (from the past 48 hours)  Urinalysis, Complete w Microscopic -Urine, Catheterized     Status: Abnormal   Collection Time: 03/21/24  5:39 PM  Result Value Ref Range   Color, Urine STRAW (A) YELLOW   APPearance CLEAR (A) CLEAR   Specific Gravity, Urine 1.004 (L) 1.005 - 1.030   pH 6.0 5.0 - 8.0   Glucose, UA 50 (A) NEGATIVE mg/dL   Hgb urine dipstick NEGATIVE NEGATIVE   Bilirubin Urine NEGATIVE NEGATIVE   Ketones, ur NEGATIVE NEGATIVE mg/dL   Protein, ur NEGATIVE NEGATIVE mg/dL   Nitrite NEGATIVE NEGATIVE   Leukocytes,Ua NEGATIVE NEGATIVE   RBC / HPF 0-5 0 - 5 RBC/hpf   WBC, UA 0-5 0 - 5 WBC/hpf   Bacteria, UA NONE SEEN NONE SEEN   Squamous Epithelial / HPF 0-5 0 - 5 /HPF    Comment: Performed at [facility], [address] [place], [place] [address]  Basic metabolic panel     Status: Abnormal   Collection Time: 03/21/24  6:28 PM  Result Value Ref Range   Sodium 139 135 - 145 mmol/L   Potassium 3.4 (L) 3.5 - 5.1  mmol/L   Chloride 103 98 - 111 mmol/L   CO2 23 22 - 32 mmol/L   Glucose, Bld 207 (H) 70 - 99 mg/dL    Comment: Glucose reference range applies only to samples taken after fasting for at least 8 hours.   BUN 11 6 - 20 mg/dL   Creatinine, Ser 9.16 0.44 - 1.00 mg/dL   Calcium  9.3 8.9 - 10.3 mg/dL   GFR, Estimated >39 >39 mL/min    Comment: (NOTE) Calculated using the CKD-EPI Creatinine Equation (2021)    Anion gap 13 5 - 15    Comment: Performed at [facility], [address] [place], [place] [address]  Glucose, capillary     Status: Abnormal   Collection Time: 03/21/24  6:32 PM  Result Value Ref Range   Glucose-Capillary 186 (H) 70 - 99 mg/dL    Comment: Glucose reference range applies only to samples taken after fasting for at least 8 hours.  Glucose, capillary     Status: Abnormal   Collection Time: 03/22/24 11:47 AM  Result Value Ref Range   Glucose-Capillary 216 (H) 70 - 99 mg/dL    Comment: Glucose reference range applies only to samples taken after fasting for at least 8 hours.    Blood Alcohol level:  No results found for: [facility]  Metabolic Disorder Labs: Lab Results  Component Value Date   HGBA1C 8.4 (H) 03/20/2024   MPG 194 03/20/2024   No results found for: PROLACTIN No results found for: CHOL, TRIG, HDL, CHOLHDL, VLDL, LDLCALC  Physical Findings: AIMS:  , ,  ,  ,    CIWA:    COWS:      Psychiatric Specialty Exam:  Presentation  General Appearance:  Appropriate for Environment  Eye Contact: Fair  Speech: Clear and Coherent  Speech Volume: Normal  Mood and Affect  Mood: Anxious   Affect: Congruent   Thought Process  Thought Processes: Linear  Orientation:Full (Time, Place and Person)  Thought Content: Logical  Hallucinations:Hallucinations: None  Ideas of Reference:None  Suicidal Thoughts:Suicidal Thoughts: No  Homicidal Thoughts:Homicidal Thoughts: No   Sensorium  Memory: Immediate Fair; Recent  Fair  Judgment: Fair  Insight: Fair   Art Therapist  Concentration: Fair  Attention Span: Fair  Recall: [employer] of Knowledge: Fair  Language: Fair   Psychomotor Activity  Psychomotor Activity: Psychomotor Activity: Normal  Musculoskeletal: Strength & Muscle Tone: within normal limits Gait & Station: normal Assets  Assets: Desire for Improvement; Housing; Social Support    Physical Exam: Physical Exam ROS Blood pressure (!) 122/102, pulse (!) 102, temperature 98.2 F (36.8 C), temperature source Oral, resp. rate 18, height 5' 3 (1.6 m), weight 72.1 kg, last menstrual period 10/01/2012, SpO2 97%. Body mass index is 28.17 kg/m.  Diagnosis: Principal Problem:   Psychosis (HCC) Active Problems:   Somatic symptom disorder   PLAN: Safety and Monitoring:  -- Voluntary admission to inpatient psychiatric unit for safety, stabilization and treatment  -- Daily contact with patient to assess and evaluate symptoms and progress in treatment  -- Patient's case to be discussed in multi-disciplinary team meeting  -- Observation Level : q15 minute checks  -- Vital signs:  q12 hours  -- Precautions: suicide, elopement, and assault -- Encouraged patient to participate in unit milieu and in scheduled group therapies  2. Psychiatric Treatment:  Scheduled Medications: Risperdal  1 mg twice daily Valium  2 mg thre times daily    -- The risks/benefits/side-effects/alternatives to this medication were discussed in detail with the patient and time was given for questions. The patient consents to medication trial.  3. Medical Issues Being Addressed:  Hospitalist following for urinary retention Neurology consult placed   4. Discharge Planning:             -- Likely Sunday or Monday  -- Social work and case management to assist with discharge planning and identification of hospital follow-up needs prior to discharge  -- Estimated LOS: 3-4 days  Camelia LITTIE Lukes,  PA-C 03/22/2024, 10:21 PM

## 2024-03-22 NOTE — Progress Notes (Signed)
   03/21/24 2100  Psych Admission Type (Psych Patients Only)  Admission Status Voluntary  Psychosocial Assessment  Patient Complaints None  Eye Contact Fair  Facial Expression Other (Comment) (WDL)  Affect Appropriate to circumstance  Speech Logical/coherent  Interaction Assertive  Motor Activity Slow  Appearance/Hygiene Unremarkable  Behavior Characteristics Cooperative;Appropriate to situation  Mood Anxious;Pleasant  Thought Process  Coherency WDL  Content WDL  Delusions None reported or observed  Perception WDL  Hallucination None reported or observed  Judgment WDL  Confusion None  Danger to Self  Current suicidal ideation? Denies  Agreement Not to Harm Self Yes  Description of Agreement Verbal  Danger to Others  Danger to Others None reported or observed

## 2024-03-22 NOTE — Progress Notes (Signed)
 Patient is a voluntary to BMU for psychosis.  Patient is a&ox4 with a recent cochlear implant.  Patient's complaints are somatic including urine retention, headache, and having a hard time hearing with the implant and needs to read lips.  Otherwise c/o being overwhelmed at home with taking care of her mother and having her own medical problems. Patient currently in ct and then going for an EEG. Also, changed by diabetic coordinator to cbg's with sliding scale coverage.  With the urine retention, patient complained twice today of feeling like she was retaining and the bladder scan in the am showed 250cc and with the 2nd she didn't show any.  Both times the patient got up and was able to urinate on her own.  Denies SI, HI, AVH, anxiety and depression.  Can ramble with her speech, but with encouragement will stay on track.  Will continue to monitor.

## 2024-03-22 NOTE — Group Note (Signed)
 Date:  03/22/2024 Time:  8:31 PM  Group Topic/Focus:  Wrap-Up Group:   The focus of this group is to help patients review their daily goal of treatment and discuss progress on daily workbooks.    Participation Level:  Did Not Attend    Sara Briggs Servant 03/22/2024, 8:31 PM

## 2024-03-23 DIAGNOSIS — R569 Unspecified convulsions: Secondary | ICD-10-CM

## 2024-03-23 DIAGNOSIS — R4182 Altered mental status, unspecified: Secondary | ICD-10-CM

## 2024-03-23 MED ORDER — DIPHENHYDRAMINE HCL 25 MG PO CAPS
25.0000 mg | ORAL_CAPSULE | Freq: Once | ORAL | Status: AC
  Administered 2024-03-23: 25 mg via ORAL
  Filled 2024-03-23: qty 1

## 2024-03-23 NOTE — Progress Notes (Signed)
 Brief hospitalist follow-up note  HPI: Sara Briggs is a 58 y.o. 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  I was contacted for medical consultation by the psychiatric team, as the patient is experiencing symptomatic urinary retention.  She complained of lower pelvic discomfort, bladder scan was performed with 658 mL of urine found to be in the bladder.  Hospitalist was contacted for recommendations.   On further review of the patient's chart, she is followed by gynecology Dr. Sherida and surgical repair of cystocele with rectocele in July 2025, after which she developed difficulty with intermittent urinary retention.  She was diagnosed by her urologist with high tone pelvic floor dysfunction and there was discussion about performing intermittent self-catheterization at home, and she was also prescribed PT.  It seems that she did not start PT yet.  Recommendations:   Patient urinary retention is chronic in nature, followed by outpatient OB/GYN.  Felt to be secondary to high tone pelvic floor dysfunction.  Fortunately her urinalysis is inconsistent with UTI and she has no evidence of acute kidney injury   Bladder scans 4 times daily In-N-Out catheterization if postvoid residual greater than 400 cc Continue Flomax 0.4 mg daily Intravaginal Valium  10 mg every 8 hours as needed Ambulation/exertion as tolerated Discussed with PT.  Unfortunately we do not have a pelvic therapist on inpatient staff.  This will require outpatient follow-up.  It seems that the patient was referred to outpatient physical therapy by her gynecologist  Discussed with bedside RN on 11/20 9 AM.  Per RN patient was observed by nurse to be voiding on her own.  Patient also reported  drinking water and voiding.  Her renal function is normal.  Her urinalysis is inconsistent with infection.  Do not recommend antibiotics at this time.  Thank you for the consult.  TRH to sign off at this time.  Please reach out with further questions or concerns.  If patient has continued issues with urinary retention suggest reaching out to urology or gynecology further recommendations.  Calvin Robson MD  No charge

## 2024-03-23 NOTE — Progress Notes (Signed)
   03/23/24 2100  Psych Admission Type (Psych Patients Only)  Admission Status Voluntary  Psychosocial Assessment  Patient Complaints Anxiety  Eye Contact Fair  Facial Expression Anxious  Affect Anxious  Speech Logical/coherent  Interaction Attention-seeking  Motor Activity Slow  Appearance/Hygiene Improved;Layered clothes  Behavior Characteristics Cooperative;Appropriate to situation  Mood Anxious  Thought Process  Coherency WDL  Content WDL  Delusions None reported or observed  Perception WDL  Hallucination None reported or observed  Judgment Poor  Confusion None  Danger to Self  Current suicidal ideation? Denies  Agreement Not to Harm Self Yes  Description of Agreement Verbal  Danger to Others  Danger to Others None reported or observed   15 minutes safety checks maintained, she ambulates on the unit with the wheel chair, her gait is stable, she denies SI/HI/AVH no distress noted will continue to monitor.

## 2024-03-23 NOTE — Plan of Care (Signed)
   Problem: Education: Goal: Verbalization of understanding the information provided will improve Outcome: Progressing

## 2024-03-23 NOTE — Progress Notes (Signed)
 [facility] MD Progress Note  03/23/2024 12:16 PM Sara Briggs  MRN:  [id number]   Subjective:  Chart reviewed, case discussed in multidisciplinary meeting, patient seen during rounds.   03/23/2024: Patient seen on rounds today by this provider.  Patient denies suicidal or homicidal ideations.  Patient denied any auditory or visual hallucinations as well.  Patient reported good appetite and fair sleep.  Reference hospitalist note for plan regarding urinary retention.  Patient denied current headache, but did report episodes of dizziness and lightheadedness as well as photophobia.  Neurology is on board and following patient as well.  As stated below,Suspect some somatic symptoms and anxiety associated with recent medical issues and caregiver fatigue.  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  Patient reported tolerating medications with no noted side effects at this time.  Patient was interested in discharging.  Patient did not appear to be responding to internal stimuli on exam.  Patient reported feeling as though a lot of her symptoms are related to her Mnire's disease. Patient did present with some circumstantial speech, but was redirectable during this providers exam.   11/28: Patient seen today with supervising physician, Dr. Jadapalle.  Patient is more linear and logical on exam today.  She denies SI/HI/plan and denies hallucinations.  She reports fair sleep and good appetite.  Patient reports previously trialing Xanax for anxiety in 2020 approximately.  Hospitalist continues to follow for urinary retention.  Hospitalist Dr. Jhonny advised may discontinue amoxicillin  due to current urinalysis not consistent with infection.  Patient reports persistent headache and photophobia.  Consult placed to neurology for evaluation of headaches and altered mental status.  Suspect some somatic symptoms and anxiety associated with recent medical issues and caregiver fatigue.  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  Provider and social worker, Sara Briggs, contacted patient's husband, Sara Briggs, to provide update of patient's progress and treatment plan.  Discussed importance of outpatient follow-up with specialty providers upon hospital discharge, including OB/GYN, psychiatry, therapy, ENT, as well as neurology if indicated.  Discussed caregiver fatigue and available resources. All questions answered.  Patient's husband agreeable to discharge Sunday or Monday.   Past Psychiatric History: see h&P Family History:  Family History  Problem Relation Age of Onset   Colon cancer Maternal Uncle    Colon cancer Maternal Grandfather    Diabetes Mother    Hypertension Mother    Hypertension Father    Sleep apnea Brother    Hyperlipidemia Brother    Heart disease Maternal Grandmother    Hypertension Paternal Grandmother    Hyperlipidemia Paternal Grandmother    CVA Paternal Grandfather    Hypertension Paternal Grandfather    Hyperlipidemia Paternal Grandfather    Sleep apnea Brother    Hearing loss Brother    Leukemia Paternal Uncle    Prostate cancer Maternal Uncle    Lung cancer Maternal Aunt    Social History:  Social History   Substance and Sexual Activity  Alcohol Use No     Social History   Substance and Sexual Activity  Drug Use No    Social History   Socioeconomic History   Marital status: Married    Spouse name: Not on file   Number of children: Not on file   Years of education: Not on file   Highest education level: Not on file  Occupational History   Occupation: academic librarian  Tobacco Use   Smoking status: Never   Smokeless tobacco: Never  Vaping Use   Vaping  status: Never Used  Substance and Sexual Activity   Alcohol use: No   Drug use: No   Sexual activity: Yes    Birth control/protection: Post-menopausal  Other Topics Concern   Not on file  Social History Narrative   Married.     Social Drivers of Corporate Investment Banker Strain: Low Risk   (09/06/2023)   Received from [facility]   Overall Financial Resource Strain (CARDIA)    Difficulty of Paying Living Expenses: Not hard at all  Food Insecurity: No Food Insecurity (03/20/2024)   Hunger Vital Sign    Worried About Running Out of Food in the Last Year: Never true    Ran Out of Food in the Last Year: Never true  Transportation Needs: No Transportation Needs (03/20/2024)   PRAPARE - Administrator, Civil Service (Medical): No    Lack of Transportation (Non-Medical): No  Physical Activity: Insufficiently Active (09/06/2023)   Received from [facility]   Exercise Vital Sign    On average, how many days per week do you engage in moderate to strenuous exercise (like a brisk walk)?: 2 days    On average, how many minutes do you engage in exercise at this level?: 40 min  Stress: No Stress Concern Present (11/01/2023)   Received from [facility] - Occupational Stress Questionnaire    Feeling of Stress : Not at all  Social Connections: Socially Integrated (09/06/2023)   Received from [facility]   Social Network    How would you rate your social network (family, work, friends)?: Good participation with social networks   Past Medical History:  Past Medical History:  Diagnosis Date   Allergic    Allergy    Cancer (HCC)    skin cancer, basal cell carcinoma   Diabetes mellitus without complication (HCC)    Frequent headaches    Hearing loss    HOH (hard of hearing)    Hyperlipidemia    IBS (irritable bowel syndrome)    Meniere's disease    Obesity    Rectocele    Tinnitus     Past Surgical History:  Procedure Laterality Date   btl     CESAREAN SECTION     LAPAROSCOPY FOR ECTOPIC PREGNANCY      Current Medications: Current Facility-Administered Medications  Medication Dose Route Frequency Provider Last Rate Last Admin   alum & mag hydroxide-simeth (MAALOX/MYLANTA) 200-200-20 MG/5ML suspension 30 mL  30 mL  Oral Q4H PRN McLauchlin, Angela, NP       calcium  citrate (CALCITRATE - dosed in mg elemental calcium ) tablet 475 mg  475 mg Oral BID Hunter, Crystal L, PA-C   475 mg at 03/22/24 9096   diazepam  (VALIUM ) tablet 10 mg  10 mg Oral Q8H PRN Sara Briggs Sahara B, MD       diazepam  (VALIUM ) tablet 2 mg  2 mg Oral Q8H Hunter, Crystal L, PA-C   2 mg at 03/23/24 9367   estradiol  (ESTRACE ) 0.01 % vaginal cream 1 Applicatorful  1 Applicatorful Vaginal Once per day on Monday Wednesday Friday Hunter, Crystal L, PA-C   1 Applicatorful at 03/22/24 9095   ibuprofen  (ADVIL ) tablet 600 mg  600 mg Oral Q6H PRN Sara Briggs, Sree, MD   600 mg at 03/23/24 0142   insulin  aspart (novoLOG ) injection 0-5 Units  0-5 Units Subcutaneous QHS Sara Briggs, Sree, MD   3 Units at 03/22/24 2214   insulin  aspart (novoLOG ) injection 0-9  Units  0-9 Units Subcutaneous TID WC Sara Briggs, Sree, MD   5 Units at 03/23/24 9192   linagliptin  (TRADJENTA ) tablet 5 mg  5 mg Oral Daily Hunter, Crystal L, PA-C   5 mg at 03/22/24 0903   magnesium  hydroxide (MILK OF MAGNESIA) suspension 30 mL  30 mL Oral Daily PRN McLauchlin, Angela, NP       OLANZapine  (ZYPREXA ) injection 5 mg  5 mg Intramuscular TID PRN McLauchlin, Jon, NP   5 mg at 03/21/24 1819   OLANZapine  zydis (ZYPREXA ) disintegrating tablet 5 mg  5 mg Oral TID PRN McLauchlin, Angela, NP       potassium chloride  SA (KLOR-CON  M) CR tablet 20 mEq  20 mEq Oral Once Hunter, Crystal L, PA-C       risperiDONE  (RISPERDAL ) tablet 1 mg  1 mg Oral BID Hunter, Crystal L, PA-C   1 mg at 03/22/24 2203   tamsulosin  (FLOMAX ) capsule 0.4 mg  0.4 mg Oral Daily Sreenath, Sudheer B, MD   0.4 mg at 03/22/24 1535   traMADol  (ULTRAM ) tablet 50 mg  50 mg Oral Q6H PRN Sara Briggs, Sree, MD   50 mg at 03/23/24 0631   triamterene -hydrochlorothiazide  (MAXZIDE -25) 37.5-25 MG per tablet 1 tablet  1 tablet Oral Daily Hunter, Crystal L, PA-C   1 tablet at 03/22/24 9096    Lab Results:  Results for orders placed or performed  during the hospital encounter of 03/20/24 (from the past 48 hours)  Urinalysis, Complete w Microscopic -Urine, Catheterized     Status: Abnormal   Collection Time: 03/21/24  5:39 PM  Result Value Ref Range   Color, Urine STRAW (A) YELLOW   APPearance CLEAR (A) CLEAR   Specific Gravity, Urine 1.004 (L) 1.005 - 1.030   pH 6.0 5.0 - 8.0   Glucose, UA 50 (A) NEGATIVE mg/dL   Hgb urine dipstick NEGATIVE NEGATIVE   Bilirubin Urine NEGATIVE NEGATIVE   Ketones, ur NEGATIVE NEGATIVE mg/dL   Protein, ur NEGATIVE NEGATIVE mg/dL   Nitrite NEGATIVE NEGATIVE   Leukocytes,Ua NEGATIVE NEGATIVE   RBC / HPF 0-5 0 - 5 RBC/hpf   WBC, UA 0-5 0 - 5 WBC/hpf   Bacteria, UA NONE SEEN NONE SEEN   Squamous Epithelial / HPF 0-5 0 - 5 /HPF    Comment: Performed at [facility], [address] [place], [place] [address]  Basic metabolic panel     Status: Abnormal   Collection Time: 03/21/24  6:28 PM  Result Value Ref Range   Sodium 139 135 - 145 mmol/L   Potassium 3.4 (L) 3.5 - 5.1 mmol/L   Chloride 103 98 - 111 mmol/L   CO2 23 22 - 32 mmol/L   Glucose, Bld 207 (H) 70 - 99 mg/dL    Comment: Glucose reference range applies only to samples taken after fasting for at least 8 hours.   BUN 11 6 - 20 mg/dL   Creatinine, Ser 9.16 0.44 - 1.00 mg/dL   Calcium  9.3 8.9 - 10.3 mg/dL   GFR, Estimated >39 >39 mL/min    Comment: (NOTE) Calculated using the CKD-EPI Creatinine Equation (2021)    Anion gap 13 5 - 15    Comment: Performed at [facility], [address] [place], [place] [address]  Glucose, capillary     Status: Abnormal   Collection Time: 03/21/24  6:32 PM  Result Value Ref Range   Glucose-Capillary 186 (H) 70 - 99 mg/dL    Comment: Glucose reference range applies only  to samples taken after fasting for at least 8 hours.  Glucose, capillary     Status: Abnormal   Collection Time: 03/22/24 11:47 AM  Result Value Ref Range   Glucose-Capillary 216 (H) 70 - 99 mg/dL    Comment:  Glucose reference range applies only to samples taken after fasting for at least 8 hours.    Blood Alcohol level:  No results found for: [facility]  Metabolic Disorder Labs: Lab Results  Component Value Date   HGBA1C 8.4 (H) 03/20/2024   MPG 194 03/20/2024   No results found for: PROLACTIN No results found for: CHOL, TRIG, HDL, CHOLHDL, VLDL, LDLCALC  Physical Findings: AIMS:  , ,  ,  ,    CIWA:    COWS:      Psychiatric Specialty Exam:  Presentation  General Appearance:  Appropriate for Environment  Eye Contact: Fair  Speech: Clear and Coherent  Speech Volume: Normal    Mood and Affect  Mood: Anxious   Affect: Congruent   Thought Process  Thought Processes: Linear  Orientation:Full (Time, Place and Person)  Thought Content: Logical  Hallucinations:No data recorded  Ideas of Reference:None  Suicidal Thoughts:No data recorded  Homicidal Thoughts:No data recorded   Sensorium  Memory: Immediate Fair; Recent Fair  Judgment: Fair  Insight: Fair   Art Therapist  Concentration: Fair  Attention Span: Fair  Recall: [employer] of Knowledge: Fair  Language: Fair   Psychomotor Activity  Psychomotor Activity: No data recorded  Musculoskeletal: Strength & Muscle Tone: within normal limits Gait & Station: normal Assets  Assets: Desire for Improvement; Housing; Social Support    Physical Exam: Physical Exam Pulmonary:     Effort: Pulmonary effort is normal.  Neurological:     Mental Status: She is alert and oriented to person, place, and time.    Review of Systems  Respiratory:  Negative for shortness of breath.   Cardiovascular:  Negative for chest pain.  Neurological:  Positive for dizziness. Negative for speech change and loss of consciousness.  Psychiatric/Behavioral:  Negative for depression, hallucinations and suicidal ideas.    Blood pressure 124/87, pulse (!) 110, temperature 97.7 F (36.5  C), temperature source Oral, resp. rate 19, height 5' 3 (1.6 m), weight 72.1 kg, last menstrual period 10/01/2012, SpO2 100%. Body mass index is 28.17 kg/m.  Diagnosis: Principal Problem:   Psychosis (HCC) Active Problems:   Somatic symptom disorder   PLAN: Safety and Monitoring:  -- Voluntary admission to inpatient psychiatric unit for safety, stabilization and treatment  -- Daily contact with patient to assess and evaluate symptoms and progress in treatment  -- Patient's case to be discussed in multi-disciplinary team meeting  -- Observation Level : q15 minute checks  -- Vital signs:  q12 hours  -- Precautions: suicide, elopement, and assault -- Encouraged patient to participate in unit milieu and in scheduled group therapies  2. Psychiatric Treatment:  Scheduled Medications: Risperdal  1 mg twice daily Valium  2 mg three times daily  -- The risks/benefits/side-effects/alternatives to this medication were discussed in detail with the patient and time was given for questions. The patient consents to medication trial.  3. Medical Issues Being Addressed:  Hospitalist following for urinary retention Neurology consult placed   4. Discharge Planning:             -- Likely Sunday or Monday  -- Social work and case management to assist with discharge planning and identification of hospital follow-up needs prior to discharge  -- Estimated LOS:  3-4 days  Zelda Sharps, NP This note was created using [employer]. Please excuse any inadvertent transcription errors. Case was discussed with supervising physician Dr. Jadapalle who is agreeable with current plan.

## 2024-03-23 NOTE — Group Note (Signed)
 [facility] LCSW Group Therapy Note   Group Date: 03/23/2024 Start Time: 1330 End Time: 1415   Type of Therapy/Topic:  Group Therapy:  Emotion Regulation  Participation Level:  Did Not Attend   Mood:  Description of Group:    The purpose of this group is to assist patients in learning to regulate negative emotions and experience positive emotions. Patients will be guided to discuss ways in which they have been vulnerable to their negative emotions. These vulnerabilities will be juxtaposed with experiences of positive emotions or situations, and patients challenged to use positive emotions to combat negative ones. Special emphasis will be placed on coping with negative emotions in conflict situations, and patients will process healthy conflict resolution skills.  Therapeutic Goals: Patient will identify two positive emotions or experiences to reflect on in order to balance out negative emotions:  Patient will label two or more emotions that they find the most difficult to experience:  Patient will be able to demonstrate positive conflict resolution skills through discussion or role plays:   Summary of Patient Progress:   Did not attend group    Therapeutic Modalities:   Cognitive Behavioral Therapy Feelings Identification Dialectical Behavioral Therapy   Aldo CHRISTELLA Niece, LCSW

## 2024-03-23 NOTE — Procedures (Signed)
 History: 58 yo F with AMS, eeg to evaluate for seizures  EEG Duration: 22 minutes  Sedation: none  Patient State: Awake and asleep  Technique: This EEG was acquired with electrodes placed according to the International 10-20 electrode system (including Fp1, Fp2, F3, F4, C3, C4, P3, P4, O1, O2, T3, T4, T5, T6, A1, A2, Fz, Cz, Pz). The following electrodes were missing or displaced: none.   Background: The background consists of intermixed alpha and beta activities. There is a well defined posterior dominant rhythm of 10 Hz that attenuates with eye opening. Sleep is recorded with normal appearing structures.   Photic stimulation: Physiologic driving is not performed  EEG Abnormalities: none  Clinical Interpretation: This normal EEG is recorded in the waking and sleep state. There was no seizure or seizure predisposition recorded on this study. Please note that lack of epileptiform activity on EEG does not preclude the possibility of epilepsy.   Aisha Seals, MD [facility]   If 7pm- 7am, please page neurology on call as listed in [facility].

## 2024-03-23 NOTE — Group Note (Signed)
 Date:  03/23/2024 Time:  10:12 AM  Group Topic/Focus:  Coping With Mental Health Crisis:   The purpose of this group is to help patients identify strategies for coping with mental health crisis.  Group discusses possible causes of crisis and ways to manage them effectively. Group also listened to music to prepare ourselves for the day.    Participation Level:  Did Not Attend  Leigh VEAR Pais 03/23/2024, 10:12 AM

## 2024-03-23 NOTE — Progress Notes (Addendum)
 Patient refused morning medication reporting nausea. Patient refused nausea interventions. Education on interventions provided. Patient complaining of not feeling right. Vitals assessed and WDL. Patient escorted to her room and into her bed. Patient resting. 15 min safety checks performed. Patient remains safe at this time.   03/23/24 0808  Psych Admission Type (Psych Patients Only)  Admission Status Voluntary  Psychosocial Assessment  Patient Complaints Anxiety  Eye Contact Fair  Facial Expression Anxious  Affect Anxious;Preoccupied  Speech Logical/coherent  Interaction Attention-seeking;Needy  Motor Activity Slow  Appearance/Hygiene Unremarkable  Behavior Characteristics Anxious  Mood Anxious;Preoccupied  Thought Process  Coherency WDL  Content WDL  Delusions None reported or observed  Perception WDL  Hallucination None reported or observed  Judgment Poor  Confusion None  Danger to Self  Current suicidal ideation? Denies  Agreement Not to Harm Self Yes  Description of Agreement Verbal  Danger to Others  Danger to Others None reported or observed

## 2024-03-23 NOTE — Plan of Care (Signed)
   Problem: Education: Goal: Emotional status will improve Outcome: Progressing Goal: Mental status will improve Outcome: Progressing Goal: Verbalization of understanding the information provided will improve Outcome: Progressing

## 2024-03-23 NOTE — Group Note (Signed)
 Date:  03/23/2024 Time:  8:44 PM  Group Topic/Focus:  Conflict Resolution:   The focus of this group is to discuss the conflict resolution process and how it may be used upon discharge.    Pt did not attend group.   Bryon Parker L 03/23/2024, 8:44 PM

## 2024-03-24 LAB — GLUCOSE, CAPILLARY
Glucose-Capillary: 212 mg/dL — ABNORMAL HIGH (ref 70–99)
Glucose-Capillary: 268 mg/dL — ABNORMAL HIGH (ref 70–99)
Glucose-Capillary: 162 mg/dL — ABNORMAL HIGH (ref 70–99)
Glucose-Capillary: 267 mg/dL — ABNORMAL HIGH (ref 70–99)
Glucose-Capillary: 185 mg/dL — ABNORMAL HIGH (ref 70–99)
Glucose-Capillary: 209 mg/dL — ABNORMAL HIGH (ref 70–99)
Glucose-Capillary: 238 mg/dL — ABNORMAL HIGH (ref 70–99)
Glucose-Capillary: 202 mg/dL — ABNORMAL HIGH (ref 70–99)
Glucose-Capillary: 160 mg/dL — ABNORMAL HIGH (ref 70–99)
Glucose-Capillary: 165 mg/dL — ABNORMAL HIGH (ref 70–99)
Glucose-Capillary: 182 mg/dL — ABNORMAL HIGH (ref 70–99)

## 2024-03-24 NOTE — Group Note (Signed)
 Date:  03/24/2024 Time:  3:22 PM  Group Topic/Focus:  Dimensions of Wellness:   The focus of this group is to introduce the topic of wellness and discuss the role each dimension of wellness plays in total health.    Participation Level:  Active  Participation Quality:  Appropriate  Affect:  Appropriate  Cognitive:  Appropriate  Insight: Appropriate  Engagement in Group:  Engaged  Modes of Intervention:  Activity  Additional Comments:    Camellia HERO Antjuan Rothe 03/24/2024, 3:22 PM

## 2024-03-24 NOTE — Progress Notes (Signed)
 [facility] MD Progress Note  03/24/2024 2:54 PM Sara Briggs  MRN:  [id number]   Subjective:  Chart reviewed, case discussed in multidisciplinary meeting, patient seen during rounds.   11/30: Patient denied suicidal or homicidal ideations.  Patient denied any current auditory or visual hallucinations as well.  Patient thought process seems to be improved as  patient engaging in logical, coherent conversation today.  Patient is not noted to require as much redirection on today's assessment.  Patient did not appear to be responding to internal stimuli.  Patient was able to discuss her current treatment plan with this provider.  Patient was also able to reiterate the importance of follow-up care regarding her current condition and prioritizing her mental health.  Patient reported tolerating current medication regimen well and denied any noted side effects.  Patient reported she feels as though her lack of sleep, increase stressors, and caregiver fatigue all played a role in bringing her to the hospital initially.  Patient's husband plans to come visit patient again today.  Social work team is working on aligning patient appropriate follow-up outpatient resources for continuity of care.  03/23/2024: Patient seen on rounds today by this provider.  Patient denies suicidal or homicidal ideations.  Patient denied any auditory or visual hallucinations as well.  Patient reported good appetite and fair sleep.  Reference hospitalist note for plan regarding urinary retention.  Patient denied current headache, but did report episodes of dizziness and lightheadedness as well as photophobia.  Neurology is on board and following patient as well.  As stated below,Suspect some somatic symptoms and anxiety associated with recent medical issues and caregiver fatigue.  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  Patient reported tolerating medications with no noted side effects at this time.  Patient was interested in discharging.   Patient did not appear to be responding to internal stimuli on exam.  Patient reported feeling as though a lot of her symptoms are related to her Mnire's disease. Patient did present with some circumstantial speech, but was redirectable during this providers exam.   11/28: Patient seen today with supervising physician, Dr. Jadapalle.  Patient is more linear and logical on exam today.  She denies SI/HI/plan and denies hallucinations.  She reports fair sleep and good appetite.  Patient reports previously trialing Xanax for anxiety in 2020 approximately.  Hospitalist continues to follow for urinary retention.  Hospitalist Dr. Jhonny advised may discontinue amoxicillin  due to current urinalysis not consistent with infection.  Patient reports persistent headache and photophobia.  Consult placed to neurology for evaluation of headaches and altered mental status.  Suspect some somatic symptoms and anxiety associated with recent medical issues and caregiver fatigue.  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  Provider and social worker, Sara Briggs, contacted patient's husband, Sara Briggs, to provide update of patient's progress and treatment plan.  Discussed importance of outpatient follow-up with specialty providers upon hospital discharge, including OB/GYN, psychiatry, therapy, ENT, as well as neurology if indicated.  Discussed caregiver fatigue and available resources. All questions answered.  Patient's husband agreeable to discharge Sunday or Monday.   Past Psychiatric History: see h&P Family History:  Family History  Problem Relation Age of Onset   Colon cancer Maternal Uncle    Colon cancer Maternal Grandfather    Diabetes Mother    Hypertension Mother    Hypertension Father    Sleep apnea Brother    Hyperlipidemia Brother    Heart disease Maternal Grandmother    Hypertension Paternal Grandmother  Hyperlipidemia Paternal Grandmother    CVA Paternal Grandfather    Hypertension  Paternal Grandfather    Hyperlipidemia Paternal Grandfather    Sleep apnea Brother    Hearing loss Brother    Leukemia Paternal Uncle    Prostate cancer Maternal Uncle    Lung cancer Maternal Aunt    Social History:  Social History   Substance and Sexual Activity  Alcohol Use No     Social History   Substance and Sexual Activity  Drug Use No    Social History   Socioeconomic History   Marital status: Married    Spouse name: Not on file   Number of children: Not on file   Years of education: Not on file   Highest education level: Not on file  Occupational History   Occupation: academic librarian  Tobacco Use   Smoking status: Never   Smokeless tobacco: Never  Vaping Use   Vaping status: Never Used  Substance and Sexual Activity   Alcohol use: No   Drug use: No   Sexual activity: Yes    Birth control/protection: Post-menopausal  Other Topics Concern   Not on file  Social History Narrative   Married.     Social Drivers of Corporate Investment Banker Strain: Low Risk  (09/06/2023)   Received from [facility]   Overall Financial Resource Strain (CARDIA)    Difficulty of Paying Living Expenses: Not hard at all  Food Insecurity: No Food Insecurity (03/20/2024)   Hunger Vital Sign    Worried About Running Out of Food in the Last Year: Never true    Ran Out of Food in the Last Year: Never true  Transportation Needs: No Transportation Needs (03/20/2024)   PRAPARE - Administrator, Civil Service (Medical): No    Lack of Transportation (Non-Medical): No  Physical Activity: Insufficiently Active (09/06/2023)   Received from [facility]   Exercise Vital Sign    On average, how many days per week do you engage in moderate to strenuous exercise (like a brisk walk)?: 2 days    On average, how many minutes do you engage in exercise at this level?: 40 min  Stress: No Stress Concern Present (11/01/2023)   Received from [facility] of Occupational  Health - Occupational Stress Questionnaire    Feeling of Stress : Not at all  Social Connections: Socially Integrated (09/06/2023)   Received from [facility]   Social Network    How would you rate your social network (family, work, friends)?: Good participation with social networks   Past Medical History:  Past Medical History:  Diagnosis Date   Allergic    Allergy    Cancer (HCC)    skin cancer, basal cell carcinoma   Diabetes mellitus without complication (HCC)    Frequent headaches    Hearing loss    HOH (hard of hearing)    Hyperlipidemia    IBS (irritable bowel syndrome)    Meniere's disease    Obesity    Rectocele    Tinnitus     Past Surgical History:  Procedure Laterality Date   btl     CESAREAN SECTION     LAPAROSCOPY FOR ECTOPIC PREGNANCY      Current Medications: Current Facility-Administered Medications  Medication Dose Route Frequency Provider Last Rate Last Admin   alum & mag hydroxide-simeth (MAALOX/MYLANTA) 200-200-20 MG/5ML suspension 30 mL  30 mL Oral Q4H PRN McLauchlin, Jon, NP  calcium  citrate (CALCITRATE - dosed in mg elemental calcium ) tablet 475 mg  475 mg Oral BID Hunter, Crystal L, PA-C   475 mg at 03/24/24 9144   diazepam  (VALIUM ) tablet 10 mg  10 mg Oral Q8H PRN Sreenath, Sudheer B, MD       diazepam  (VALIUM ) tablet 2 mg  2 mg Oral Q8H Hunter, Crystal L, PA-C   2 mg at 03/24/24 1428   estradiol  (ESTRACE ) 0.01 % vaginal cream 1 Applicatorful  1 Applicatorful Vaginal Once per day on Monday Wednesday Friday Hunter, Crystal L, PA-C   1 Applicatorful at 03/22/24 9095   ibuprofen  (ADVIL ) tablet 600 mg  600 mg Oral Q6H PRN Sara Briggs, Sree, MD   600 mg at 03/24/24 1040   insulin aspart (novoLOG) injection 0-5 Units  0-5 Units Subcutaneous QHS Sara Briggs, Sree, MD   2 Units at 03/23/24 2100   insulin aspart (novoLOG) injection 0-9 Units  0-9 Units Subcutaneous TID WC Sara Briggs, Sree, MD   2 Units at 03/24/24 1203   linagliptin  (TRADJENTA ) tablet 5  mg  5 mg Oral Daily Hunter, Crystal L, PA-C   5 mg at 03/24/24 9144   magnesium  hydroxide (MILK OF MAGNESIA) suspension 30 mL  30 mL Oral Daily PRN McLauchlin, Angela, NP       OLANZapine  (ZYPREXA ) injection 5 mg  5 mg Intramuscular TID PRN McLauchlin, Angela, NP   5 mg at 03/21/24 1819   OLANZapine  zydis (ZYPREXA ) disintegrating tablet 5 mg  5 mg Oral TID PRN McLauchlin, Angela, NP       potassium chloride  SA (KLOR-CON  M) CR tablet 20 mEq  20 mEq Oral Once Hunter, Crystal L, PA-C       risperiDONE  (RISPERDAL ) tablet 1 mg  1 mg Oral BID Hunter, Crystal L, PA-C   1 mg at 03/24/24 0855   tamsulosin (FLOMAX) capsule 0.4 mg  0.4 mg Oral Daily Sreenath, Sudheer B, MD   0.4 mg at 03/24/24 0855   traMADol  (ULTRAM ) tablet 50 mg  50 mg Oral Q6H PRN Sara Briggs, Sree, MD   50 mg at 03/23/24 0631   triamterene -hydrochlorothiazide  (MAXZIDE -25) 37.5-25 MG per tablet 1 tablet  1 tablet Oral Daily Hunter, Crystal L, PA-C   1 tablet at 03/24/24 0856    Lab Results:  No results found for this or any previous visit (from the past 48 hours).   Blood Alcohol level:  No results found for: [facility]  Metabolic Disorder Labs: Lab Results  Component Value Date   HGBA1C 8.4 (H) 03/20/2024   MPG 194 03/20/2024   No results found for: PROLACTIN No results found for: CHOL, TRIG, HDL, CHOLHDL, VLDL, LDLCALC  Physical Findings: AIMS:  , ,  ,  ,    CIWA:    COWS:      Psychiatric Specialty Exam:  Presentation  General Appearance:  Appropriate for Environment  Eye Contact: Good  Speech: Clear and Coherent  Speech Volume: Normal    Mood and Affect  Mood: Anxious   Affect: Congruent   Thought Process  Thought Processes: Linear  Orientation:Full (Time, Place and Person)  Thought Content: Logical  Hallucinations:Hallucinations: None   Ideas of Reference:None  Suicidal Thoughts:Suicidal Thoughts: No   Homicidal Thoughts:Homicidal Thoughts: No    Sensorium   Memory: Immediate Good; Recent Good; Remote Good  Judgment: Fair  Insight: Fair   Art Therapist  Concentration: Fair  Attention Span: Fair  Recall: Good  Fund of Knowledge: Good  Language: Good   Psychomotor Activity  Psychomotor  Activity: Psychomotor Activity: Normal   Musculoskeletal: Strength & Muscle Tone: within normal limits Gait & Station: normal Assets  Assets: Manufacturing Systems Engineer; Desire for Improvement; Housing; Intimacy; Social Support; Health And Safety Inspector    Physical Exam: Physical Exam Pulmonary:     Effort: Pulmonary effort is normal.  Neurological:     Mental Status: She is alert and oriented to person, place, and time.    Review of Systems  Respiratory:  Negative for shortness of breath.   Cardiovascular:  Negative for chest pain.  Neurological:  Negative for dizziness, speech change, loss of consciousness and headaches.  Psychiatric/Behavioral:  Negative for depression, hallucinations and suicidal ideas.    Blood pressure 129/89, pulse 100, temperature 98.1 F (36.7 C), temperature source Oral, resp. rate 20, height 5' 3 (1.6 m), weight 72.1 kg, last menstrual period 10/01/2012, SpO2 100%. Body mass index is 28.17 kg/m.  Diagnosis: Principal Problem:   Psychosis (HCC) Active Problems:   Somatic symptom disorder   PLAN: Safety and Monitoring:  -- Voluntary admission to inpatient psychiatric unit for safety, stabilization and treatment  -- Daily contact with patient to assess and evaluate symptoms and progress in treatment  -- Patient's case to be discussed in multi-disciplinary team meeting  -- Observation Level : q15 minute checks  -- Vital signs:  q12 hours  -- Precautions: suicide, elopement, and assault -- Encouraged patient to participate in unit milieu and in scheduled group therapies  2. Psychiatric Treatment:  Scheduled Medications: Risperdal  1 mg twice daily Valium  2 mg three times daily  --  The risks/benefits/side-effects/alternatives to this medication were discussed in detail with the patient and time was given for questions. The patient consents to medication trial.  3. Medical Issues Being Addressed:  Hospitalist following for urinary retention-recommended continued follow-up care with OB/GYN provider Neurology has cleared patient-no follow-up care recommended   4. Discharge Planning:             -- Likely Sunday or Monday  -- Social work and case management to assist with discharge planning and identification of hospital follow-up needs prior to discharge  -- Estimated LOS: 3-4 days  Zelda Sharps, NP This note was created using [employer]. Please excuse any inadvertent transcription errors. Case was discussed with supervising physician Dr. Jadapalle who is agreeable with current plan.

## 2024-03-24 NOTE — Care Management Important Message (Signed)
 Important Message  Patient Details  Name: Sara Briggs MRN: [id number] Date of Birth: 1965-12-10   Medicare Important Message Given:  Yes - Medicare IM     Sara Briggs M Sara Baillie, LCSW 03/24/2024, 1:44 PM

## 2024-03-24 NOTE — Plan of Care (Signed)
  Problem: Education: Goal: Knowledge of [facility] Education information/materials will improve Outcome: Progressing Goal: Emotional status will improve Outcome: Progressing Goal: Mental status will improve Outcome: Progressing Goal: Verbalization of understanding the information provided will improve Outcome: Progressing   Problem: Activity: Goal: Interest or engagement in activities will improve Outcome: Progressing Goal: Sleeping patterns will improve Outcome: Progressing   Problem: Coping: Goal: Ability to verbalize frustrations and anger appropriately will improve Outcome: Progressing Goal: Ability to demonstrate self-control will improve Outcome: Progressing   Problem: Health Behavior/Discharge Planning: Goal: Identification of resources available to assist in meeting health care needs will improve Outcome: Progressing Goal: Compliance with treatment plan for underlying cause of condition will improve Outcome: Progressing   Problem: Physical Regulation: Goal: Ability to maintain clinical measurements within normal limits will improve Outcome: Progressing   Problem: Safety: Goal: Periods of time without injury will increase Outcome: Progressing   Problem: Education: Goal: Ability to describe self-care measures that may prevent or decrease complications (Diabetes Survival Skills Education) will improve Outcome: Progressing Goal: Individualized Educational Video(s) Outcome: Progressing   Problem: Coping: Goal: Ability to adjust to condition or change in health will improve Outcome: Progressing   Problem: Fluid Volume: Goal: Ability to maintain a balanced intake and output will improve Outcome: Progressing   Problem: Health Behavior/Discharge Planning: Goal: Ability to identify and utilize available resources and services will improve Outcome: Progressing Goal: Ability to manage health-related needs will improve Outcome: Progressing   Problem:  Metabolic: Goal: Ability to maintain appropriate glucose levels will improve Outcome: Progressing   Problem: Nutritional: Goal: Maintenance of adequate nutrition will improve Outcome: Progressing Goal: Progress toward achieving an optimal weight will improve Outcome: Progressing   Problem: Skin Integrity: Goal: Risk for impaired skin integrity will decrease Outcome: Progressing   Problem: Tissue Perfusion: Goal: Adequacy of tissue perfusion will improve Outcome: Progressing

## 2024-03-24 NOTE — BHH Suicide Risk Assessment (Cosign Needed Addendum)
 [facility] Discharge Suicide Risk Assessment   Principal Problem: Psychosis [facility]) Discharge Diagnoses: Principal Problem:   Psychosis (HCC) Active Problems:   Somatic symptom disorder   Total Time spent with patient: 30 minutes  Musculoskeletal: Strength & Muscle Tone: within normal limits Gait & Station: uses walker Patient leans: N/A  Psychiatric Specialty Exam  Presentation  General Appearance:  Appropriate for Environment  Eye Contact: Good  Speech: Clear and Coherent  Speech Volume: Normal  Handedness: Right   Mood and Affect  Mood: Anxious  Duration of Depression Symptoms: Less than two weeks  Affect: Congruent   Thought Process  Thought Processes: Coherent; Linear; Goal Directed  Descriptions of Associations: Orientation:Full (Time, Place and Person)  Thought Content:Logical  History of Schizophrenia/Schizoaffective disorder:No  Duration of Psychotic Symptoms:Less than six months  Hallucinations:Hallucinations: None  Ideas of Reference:None  Suicidal Thoughts:Suicidal Thoughts: No  Homicidal Thoughts:Homicidal Thoughts: No   Sensorium  Memory: Immediate Good; Recent Good; Remote Good  Judgment: Fair  Insight: Fair   Art Therapist  Concentration: Fair  Attention Span: Fair  Recall: Good  Fund of Knowledge: Good  Language: Good   Psychomotor Activity  Psychomotor Activity: Psychomotor Activity: Normal   Assets  Assets: Communication Skills; Desire for Improvement; Housing; Intimacy; Social Support; Health And Safety Inspector   Sleep  Sleep: Sleep: Fair  Estimated Sleeping Duration (Last 24 Hours): 6.50-8.50 hours  Physical Exam: Physical Exam Pulmonary:     Effort: Pulmonary effort is normal.  Neurological:     Mental Status: She is alert and oriented to person, place, and time.    Review of Systems  Respiratory:  Negative for shortness of breath.   Cardiovascular:  Negative for chest  pain.  Psychiatric/Behavioral:  Negative for depression, hallucinations, substance abuse and suicidal ideas. The patient is nervous/anxious.    Blood pressure 129/89, pulse 100, temperature 98.1 F (36.7 C), temperature source Oral, resp. rate 20, height 5' 3 (1.6 m), weight 72.1 kg, last menstrual period 10/01/2012, SpO2 100%. Body mass index is 28.17 kg/m.  Mental Status Per Nursing Assessment::   On Admission:  NA  Demographic Factors:  NA  Loss Factors: Decline in physical health  Historical Factors: NA  Risk Reduction Factors:   Sense of responsibility to family, Religious beliefs about death, Living with another person, especially a relative, Positive social support, and Positive therapeutic relationship  Continued Clinical Symptoms:  Medical Diagnoses and Treatments/Surgeries  Cognitive Features That Contribute To Risk:  None    Suicide Risk:  Minimal: No identifiable suicidal ideation.  [name] but with morbid ruminations; may be classified as minimal risk based on the severity of the depressive symptoms   Follow-up Information     [facility] Follow up.   Why: Clinic Hours:  Monday - Friday: 8:00 a.m. - 5:00 p.m.   Walk-in assessments are available Monday, Wednesday, and Friday from 8:00 a.m. - 3:30 p.m.   You do not need an appointment during these times -- new patients may come in for same-day assessment and service connection.   If you experience a mental health or substance use crisis outside normal business hours, you may go to:   [facility] ([facility])  [address] [place], [place] [address]   Open 24 hours a day, 7 days a week for behavioral health emergencies.   If you feel unsafe, are having thoughts of self-harm, or are in danger, call 911 or go to the nearest emergency department.  You can also reach the 988 Suicide & Crisis Lifeline by calling or texting 988 any  time. Contact information: [address] [place] [address] [phone]         [facility] Follow up.   Why: Please follow up with your therapist following discharge to schedule a follow up appointment. Contact information: [address], [place], [place] [address]  Office: [phone]. [phone] Fax: [phone] Email: [email or web address]                Plan Of Care/Follow-up recommendations:  Activity:  Increase to normal as tolerated and indicated by medical team Diet:  Follow heart healthy diet  Zelda Sharps, NP

## 2024-03-24 NOTE — Progress Notes (Signed)
  [facility] Adult Case Management Discharge Plan :  Will you be returning to the same living situation after discharge:  Yes,  Patient to return home.  At discharge, do you have transportation home?: Yes,  Patient's husband to provide transportation.  Do you have the ability to pay for your medications: Yes,  [employer] MEDICARE / [facility] MEDICARE  Release of information consent forms completed and in the chart;  Patient's signature needed at discharge.  Patient to Follow up at:  Follow-up Information     [facility] Follow up.   Why: Clinic Hours:  Monday - Friday: 8:00 a.m. - 5:00 p.m.   Walk-in assessments are available Monday, Wednesday, and Friday from 8:00 a.m. - 3:30 p.m.   You do not need an appointment during these times -- new patients may come in for same-day assessment and service connection.   If you experience a mental health or substance use crisis outside normal business hours, you may go to:   [facility] ([facility])  [address] [place], [place] [address]   Open 24 hours a day, 7 days a week for behavioral health emergencies.   If you feel unsafe, are having thoughts of self-harm, or are in danger, call 911 or go to the nearest emergency department.   You can also reach the 988 Suicide & Crisis Lifeline by calling or texting 988 any time. Contact information: [address] [place] [address] [phone]         [facility] Follow up.   Why: Please follow up with your therapist following discharge to schedule a follow up appointment. Contact information: [address], [place], [place] [address]  Office: [phone] Fax: [phone] Email: [email or web address]                Next level of care provider has access to [facility] Link:no  Safety Planning and Suicide Prevention discussed: Yes,  Darneisha Windhorst, husband, [phone]     Has patient been referred  to the Quitline?: Patient does not use tobacco/nicotine products  Patient has been referred for addiction treatment: No known substance use disorder.  Alveta CHRISTELLA Kerns, LCSW 03/24/2024, 1:41 PM

## 2024-03-24 NOTE — Group Note (Signed)
 Date:  03/24/2024 Time:  3:18 PM  Group Topic/Focus:  Healthy Communication:   The focus of this group is to discuss communication, barriers to communication, as well as healthy ways to communicate with others.    Participation Level:  Active  Participation Quality:  Appropriate  Affect:  Appropriate  Cognitive:  Appropriate  Insight: Appropriate  Engagement in Group:  Engaged  Modes of Intervention:  Activity  Additional Comments:    Camellia HERO Elisabeth Strom 03/24/2024, 3:18 PM

## 2024-03-24 NOTE — Progress Notes (Signed)
   03/24/24 0740  Psych Admission Type (Psych Patients Only)  Admission Status Voluntary  Psychosocial Assessment  Patient Complaints Anxiety  Eye Contact Fair  Facial Expression Anxious  Affect Anxious  Speech Logical/coherent  Interaction Attention-seeking  Motor Activity Slow  Appearance/Hygiene Layered clothes  Behavior Characteristics Cooperative  Mood Anxious  Thought Process  Coherency WDL  Content WDL  Delusions None reported or observed  Perception WDL  Hallucination None reported or observed  Judgment Poor  Confusion None  Danger to Self  Current suicidal ideation? Denies  Agreement Not to Harm Self Yes  Description of Agreement verbal  Danger to Others  Danger to Others None reported or observed

## 2024-03-25 ENCOUNTER — Other Ambulatory Visit (HOSPITAL_COMMUNITY): Payer: Self-pay

## 2024-03-25 ENCOUNTER — Other Ambulatory Visit: Payer: Self-pay

## 2024-03-25 LAB — GLUCOSE, CAPILLARY
Glucose-Capillary: 195 mg/dL — ABNORMAL HIGH (ref 70–99)
Glucose-Capillary: 198 mg/dL — ABNORMAL HIGH (ref 70–99)

## 2024-03-25 MED ORDER — CALCIUM CITRATE 950 (200 CA) MG PO TABS
500.0000 mg | ORAL_TABLET | Freq: Two times a day (BID) | ORAL | 0 refills | Status: AC
  Filled 2024-03-25: qty 100, 100d supply, fill #0

## 2024-03-25 MED ORDER — RISPERIDONE 1 MG PO TABS
1.0000 mg | ORAL_TABLET | Freq: Two times a day (BID) | ORAL | 0 refills | Status: AC
  Filled 2024-03-25: qty 60, 30d supply, fill #0

## 2024-03-25 MED ORDER — TAMSULOSIN HCL 0.4 MG PO CAPS
0.4000 mg | ORAL_CAPSULE | Freq: Every day | ORAL | 0 refills | Status: AC
  Filled 2024-03-25: qty 30, 30d supply, fill #0

## 2024-03-25 MED ORDER — DIAZEPAM 10 MG PO TABS
10.0000 mg | ORAL_TABLET | Freq: Three times a day (TID) | ORAL | 0 refills | Status: AC | PRN
  Filled 2024-03-25: qty 90, 30d supply, fill #0

## 2024-03-25 MED ORDER — TRIAMTERENE-HCTZ 37.5-25 MG PO TABS
1.0000 | ORAL_TABLET | Freq: Every day | ORAL | 0 refills | Status: AC
  Filled 2024-03-25: qty 30, 30d supply, fill #0

## 2024-03-25 MED ORDER — DIAZEPAM 2 MG PO TABS
2.0000 mg | ORAL_TABLET | Freq: Three times a day (TID) | ORAL | 0 refills | Status: AC
  Filled 2024-03-25: qty 90, 30d supply, fill #0

## 2024-03-25 NOTE — Progress Notes (Signed)
   03/24/24 2000  Psych Admission Type (Psych Patients Only)  Admission Status Voluntary  Psychosocial Assessment  Patient Complaints Depression  Eye Contact Fair  Facial Expression Anxious  Affect Anxious  Speech Logical/coherent  Interaction Attention-seeking  Motor Activity Slow  Appearance/Hygiene Improved;Layered clothes  Behavior Characteristics Cooperative;Appropriate to situation  Mood Pleasant  Thought Process  Coherency WDL  Content WDL  Delusions None reported or observed  Perception WDL  Hallucination None reported or observed  Judgment Poor  Confusion None  Danger to Self  Current suicidal ideation? Denies  Agreement Not to Harm Self Yes  Description of Agreement Verbal  Danger to Others  Danger to Others None reported or observed   No distress noted, denies SI/HI/AVH, 15 miutes safety checks maintained.

## 2024-03-25 NOTE — Group Note (Signed)
 Recreation Therapy Group Note   Group Topic:Healthy Support Systems  Group Date: 03/25/2024 Start Time: 1010 End Time: 1055 Facilitators: Celestia Jeoffrey BRAVO, LRT, CTRS Location: Craft Room  Group Description: Straw Bridge. In groups or individually, patients were given 10 plastic drinking straws and an equal length of masking tape. Using the materials provided, patients were instructed to build a free-standing bridge-like structure to suspend an everyday item (ex: deck of cards) off the floor or table surface. All materials were required to be used in secondary school teacher. LRT facilitated post-activity discussion reviewing the importance of having strong and healthy support systems in our lives. LRT discussed how the people in our lives serve as the tape and the deck of cards we placed on top of our straw structure are the stressors we face in daily life. LRT and pts discussed what happens in our life when things get too heavy for us , and we don't have strong supports outside of the hospital. Pt shared 2 of their healthy supports in their life aloud in the group.   Goal Area(s) Addressed:  Patient will identify 2 healthy supports in their life. Patient will identify skills to successfully complete activity. Patient will identify correlation of this activity to life post-discharge.  Patient will build on frustration tolerance skills. Patient will increase team building and communication skills.    Affect/Mood: Appropriate   Participation Level: Active and Engaged   Participation Quality: Independent   Behavior: Calm and Cooperative   Speech/Thought Process: Coherent   Insight: Fair   Judgement: Fair    Modes of Intervention: STEM Activity   Patient Response to Interventions:  Attentive, Engaged, and Receptive   Education Outcome:  Acknowledges education   Clinical Observations/Individualized Feedback: Kinjal was active in their participation of session activities and group discussion. Pt  identified Jesus and my husband as healthy supports.    Plan: Continue to engage patient in RT group sessions 2-3x/week.   Jeoffrey BRAVO Celestia, LRT, CTRS 03/25/2024 1:20 PM

## 2024-03-25 NOTE — Group Note (Signed)
 [facility] LCSW Group Therapy Note    Group Date: 03/25/2024 Start Time: 1300 End Time: 1400  Type of Therapy and Topic:  Group Therapy:  Overcoming Obstacles  Participation Level:  [facility] PARTICIPATION LEVEL: Did Not Attend  Description of Group:   In this group patients will be encouraged to explore what they see as obstacles to their own wellness and recovery. They will be guided to discuss their thoughts, feelings, and behaviors related to these obstacles. The group will process together ways to cope with barriers, with attention given to specific choices patients can make. Each patient will be challenged to identify changes they are motivated to make in order to overcome their obstacles. This group will be process-oriented, with patients participating in exploration of their own experiences as well as giving and receiving support and challenge from other group members.  Therapeutic Goals: 1. Patient will identify personal and current obstacles as they relate to admission. 2. Patient will identify barriers that currently interfere with their wellness or overcoming obstacles.  3. Patient will identify feelings, thought process and behaviors related to these barriers. 4. Patient will identify two changes they are willing to make to overcome these obstacles:    Summary of Patient Progress Patient did not attend group.  Therapeutic Modalities:   Cognitive Behavioral Therapy Solution Focused Therapy Motivational Interviewing Relapse Prevention Therapy   Nadara JONELLE Fam, LCSW

## 2024-03-25 NOTE — Care Management Important Message (Signed)
 Important Message  Patient Details  Name: Sara Briggs MRN: [id number] Date of Birth: [birth year]   Important Message Given:  Yes - Medicare IM     Sherryle JINNY Margo, LCSW 03/25/2024, 10:24 AM

## 2024-03-25 NOTE — [facility] Suicide Risk Assessment (Signed)
 [facility] Discharge Suicide Risk Assessment   Principal Problem: Psychosis [facility]) Discharge Diagnoses: Principal Problem:   Psychosis (HCC) Active Problems:   Somatic symptom disorder   Total Time spent with patient: 30 minutes  Musculoskeletal: Strength & Muscle Tone: within normal limits Gait & Station: normal Patient leans: N/A  Psychiatric Specialty Exam  Presentation  General Appearance:  Appropriate for Environment  Eye Contact: Fair  Speech: Clear and Coherent  Speech Volume: Normal  Handedness: Right   Mood and Affect  Mood: Euthymic  Duration of Depression Symptoms: Less than two weeks  Affect: Appropriate   Thought Process  Thought Processes: Coherent  Descriptions of Associations:Intact  Orientation:Full (Time, Place and Person)  Thought Content:Logical  History of Schizophrenia/Schizoaffective disorder:No  Duration of Psychotic Symptoms:Less than six months  Hallucinations:Hallucinations: None  Ideas of Reference:None  Suicidal Thoughts:Suicidal Thoughts: No  Homicidal Thoughts:Homicidal Thoughts: No   Sensorium  Memory: Immediate Fair; Remote Fair  Judgment: Fair  Insight: Fair   Art Therapist  Concentration: Fair  Attention Span: Fair  Recall: [employer] of Knowledge: Fair  Language: Fair   Psychomotor Activity  Psychomotor Activity: Psychomotor Activity: Normal   Assets  Assets: Communication Skills; Resilience; Social Support   Sleep  Sleep: Sleep: Fair  Estimated Sleeping Duration (Last 24 Hours): 6.25-7.75 hours  Physical Exam: Physical Exam ROS Blood pressure 121/83, pulse (!) 106, temperature 97.8 F (36.6 C), resp. rate 18, height 5' 3 (1.6 m), weight 72.1 kg, last menstrual period 10/01/2012, SpO2 98%. Body mass index is 28.17 kg/m.  Mental Status Per Nursing Assessment::   On Admission:  NA  Demographic Factors:  Caucasian  Loss Factors: Decrease in vocational  status  Historical Factors: Impulsivity  Risk Reduction Factors:   Living with another person, especially a relative, Positive social support, Positive therapeutic relationship, and Positive coping skills or problem solving skills  Continued Clinical Symptoms:  Depression:   Impulsivity  Cognitive Features That Contribute To Risk:  None    Suicide Risk:  Minimal: No identifiable suicidal ideation.  [name] but with morbid ruminations; may be classified as minimal risk based on the severity of the depressive symptoms   Follow-up Information     [facility] Follow up.   Why: Appointment is scheduled for 03/27/2024 at [facility] information: [address], [place], [place] [address]  Office: [phone]. [phone] Fax: [phone] Email: [email or web address]        [facility] Follow up.   Why: In person assessment for psychiatry is 05/02/23 at 10:00 AM with Luke Louder, MSN, PMHNP. Contact information: [address] [place], [place], [address]  Phone: [phone]                Plan Of Care/Follow-up recommendations:  Activity:  as tolerated  Allyn Foil, MD 03/25/2024, 11:36 AM

## 2024-03-25 NOTE — Progress Notes (Signed)
  [facility] Adult Case Management Discharge Plan :  Will you be returning to the same living situation after discharge:  Yes,  pt reports she is returning home At discharge, do you have transportation home?: Yes,  pt reports her husband will provide transportation Do you have the ability to pay for your medications: [employer] MEDICARE / [facility] MEDICARE   Release of information consent forms completed and in the chart;  Patient's signature needed at discharge.  Patient to Follow up at:  Follow-up Information     [facility] Follow up.   Why: Appointment is scheduled for 03/27/2024 at [facility] information: [address], [place], [place] [address]  Office: [phone]. [phone] Fax: [phone] Email: [email or web address]        [facility] Follow up.   Why: In person assessment for psychiatry is 05/02/23 at 10:00 AM with Luke Louder, MSN, PMHNP. Contact information: [address] [place], [place], [address]  Phone: [phone]                Next level of care provider has access to [facility] Link:no  Safety Planning and Suicide Prevention discussed: Yes,  SPE completed wuth patient and her husband.       Has patient been referred to the Quitline?: Patient does not use tobacco/nicotine products  Patient has been referred for addiction treatment: No known substance use disorder.  Sherryle JINNY Margo, LCSW 03/25/2024, 12:00 PM

## 2024-03-25 NOTE — Group Note (Signed)
 Recreation Therapy Group Note   Group Topic:Other  Group Date: 03/25/2024 Start Time: 1500 End Time: 1545 Facilitators: Celestia Jeoffrey FORBES ARTICE, CTRS Location: Craft Room  Activity Description/Intervention: Therapeutic Drumming. Patients with peers and staff were given the opportunity to engage in a leader facilitated [facility] Group Empowerment Drumming Circle with staff from the [employer], in partnership with [employer]. Teaching laboratory technician and trained walt [place]ney, Norleen Mon leading with LRT observing and documenting intervention and pt response. This evidenced-based practice targets 7 areas of health and wellbeing in the human experience including: stress-reduction, exercise, self-expression, camaraderie/support, nurturing, spirituality, and music-making (leisure).    Goal Area(s) Addresses:  Patient will engage in pro-social way in music group.  Patient will follow directions of drum leader on the first prompt. Patient will demonstrate no behavioral issues during group.  Patient will identify if a reduction in stress level occurs as a result of participation in therapeutic drum circle.   Affect/Mood: N/A   Participation Level: Did not attend    Clinical Observations/Individualized Feedback: Patient did not attend group.   Plan: Continue to engage patient in RT group sessions 2-3x/week.   Jeoffrey FORBES Celestia, LRT, CTRS 03/25/2024 5:36 PM

## 2024-03-25 NOTE — Progress Notes (Signed)
 Sara Briggs  D/C'd Home per MD order.  Discussed with the patient and all questions fully answered.   An After Visit Summary was printed and given to the patient. Patient received prescription. Patient also received her home medication from the pharmacy at discharge.   D/c education completed with patient including follow up instructions, medication list, d/c activities limitations if indicated, with other d/c instructions as indicated by MD - patient able to verbalize understanding, all questions fully answered.   Patient instructed to return to ED, call 911, or call MD for any changes in condition.   Patient escorted to the main entrance, and D/C home via private auto.  Joaquin MALVA Doing 03/25/2024 5:46 PM

## 2024-03-25 NOTE — Group Note (Signed)
 Date:  03/25/2024 Time:  10:54 AM  Group Topic/Focus:  Managing Feelings:   The focus of this group is to identify what feelings patients have difficulty handling and develop a plan to handle them in a healthier way upon discharge. Personal Choices and Values:   The focus of this group is to help patients assess and explore the importance of values in their lives, how their values affect their decisions, how they express their values and what opposes their expression. Self Care:   The focus of this group is to help patients understand the importance of self-care in order to improve or restore emotional, physical, spiritual, interpersonal, and financial health.    Participation Level:  Pt Did Not Attend  Participation Quality:  Pt Did Not Attend  Affect:  Pt Did Not Attend  Cognitive:  Pt Did Not Attend  Insight: Pt Did Not Attend  Engagement in Group:  Pt Did Not Attend  Modes of Intervention:  Pt Did Not Attend  Additional Comments:  Pt Did Not Attend  Sara Briggs 03/25/2024, 10:54 AM

## 2024-03-25 NOTE — Plan of Care (Signed)
  Problem: Education: Goal: Emotional status will improve Outcome: Progressing   Problem: Activity: Goal: Interest or engagement in activities will improve Outcome: Progressing   Problem: Coping: Goal: Ability to verbalize frustrations and anger appropriately will improve Outcome: Progressing

## 2024-03-25 NOTE — Group Note (Signed)
 Date:  03/25/2024 Time:  2:42 AM   Additional Comments:  Did not attend group.  Sara Briggs 03/25/2024, 2:42 AM

## 2024-03-25 NOTE — Discharge Summary (Incomplete)
 Physician Discharge Summary Note  Patient:  Sara Briggs is an 58 y.o., female MRN:  [id number] DOB:  [birth year] Patient phone:  [phone] (home)  Patient address:   [address] [place] [address],   Total time spent: 40 min Date of Admission:  03/20/2024 Date of Discharge: 03/25/24  Reason for Admission:  [name], 58 y.o., female patient seen face to face by this provider; and chart reviewed on 03/20/24. On evaluation [name] reports experiencing rapid mood swings, difficulty sleeping, intermittent crying spells, laughing, anger, and episodes of agitation. She states she has been having auditory hallucinations ("hearing voices") and visual hallucinations (pictures appearing to move on the wall). She reports this is her third episode since Labor Day. She states her first episode was believed to be Lunesta-induced, so she discontinued that medication Patient is admitted to adult psych unit with Q15 min safety monitoring. Multidisciplinary team approach is offered. Medication management; group/milieu therapy is offered.   Principal Problem: Psychosis [facility]) Discharge Diagnoses: Principal Problem:   Psychosis (HCC) Active Problems:   Somatic symptom disorder   Past Psychiatric History: see h&p  Family Psychiatric  History: see h&p Social History:  Social History   Substance and Sexual Activity  Alcohol Use No     Social History   Substance and Sexual Activity  Drug Use No    Social History   Socioeconomic History   Marital status: Married    Spouse name: Not on file   Number of children: Not on file   Years of education: Not on file   Highest education level: Not on file  Occupational History   Occupation: academic librarian  Tobacco Use   Smoking status: Never   Smokeless tobacco: Never  Vaping Use   Vaping status: Never Used  Substance and Sexual Activity   Alcohol use: No   Drug use: No   Sexual activity: Yes    Birth control/protection: Post-menopausal   Other Topics Concern   Not on file  Social History Narrative   Married.     Social Drivers of Corporate Investment Banker Strain: Low Risk  (09/06/2023)   Received from [facility]   Overall Financial Resource Strain (CARDIA)    Difficulty of Paying Living Expenses: Not hard at all  Food Insecurity: No Food Insecurity (03/20/2024)   Hunger Vital Sign    Worried About Running Out of Food in the Last Year: Never true    Ran Out of Food in the Last Year: Never true  Transportation Needs: No Transportation Needs (03/20/2024)   PRAPARE - Administrator, Civil Service (Medical): No    Lack of Transportation (Non-Medical): No  Physical Activity: Insufficiently Active (09/06/2023)   Received from [facility]   Exercise Vital Sign    On average, how many days per week do you engage in moderate to strenuous exercise (like a brisk walk)?: 2 days    On average, how many minutes do you engage in exercise at this level?: 40 min  Stress: No Stress Concern Present (11/01/2023)   Received from [facility] - Occupational Stress Questionnaire    Feeling of Stress : Not at all  Social Connections: Socially Integrated (09/06/2023)   Received from [facility]   Social Network    How would you rate your social network (family, work, friends)?: Good participation with social networks   Past Medical History:  Past Medical History:  Diagnosis Date  Allergic    Allergy    Cancer (HCC)    skin cancer, basal cell carcinoma   Diabetes mellitus without complication (HCC)    Frequent headaches    Hearing loss    HOH (hard of hearing)    Hyperlipidemia    IBS (irritable bowel syndrome)    Meniere's disease    Obesity    Rectocele    Tinnitus     Past Surgical History:  Procedure Laterality Date   btl     CESAREAN SECTION     LAPAROSCOPY FOR ECTOPIC PREGNANCY     Family History:  Family History  Problem Relation Age of Onset    Colon cancer Maternal Uncle    Colon cancer Maternal Grandfather    Diabetes Mother    Hypertension Mother    Hypertension Father    Sleep apnea Brother    Hyperlipidemia Brother    Heart disease Maternal Grandmother    Hypertension Paternal Grandmother    Hyperlipidemia Paternal Grandmother    CVA Paternal Grandfather    Hypertension Paternal Grandfather    Hyperlipidemia Paternal Grandfather    Sleep apnea Brother    Hearing loss Brother    Leukemia Paternal Uncle    Prostate cancer Maternal Uncle    Lung cancer Maternal Aunt     Hospital Course:  [name], 58 y.o., female patient seen face to face by this provider; and chart reviewed on 03/20/24. On evaluation Ethelyne reports experiencing rapid mood swings, difficulty sleeping, intermittent crying spells, laughing, anger, and episodes of agitation. She states she has been having auditory hallucinations ("hearing voices") and visual hallucinations (pictures appearing to move on the wall). She reports this is her third episode since Labor Day. She states her first episode was believed to be Lunesta-induced, so she discontinued that medication Patient is admitted to adult psych unit with Q15 min safety monitoring. Multidisciplinary team approach is offered. Medication management; group/milieu therapy is offered.   On admission, patient is noted to have a lot of somatic symptoms in addition to her ongoing medical problems of pelvic floor dysfunction, urinary retention that she is capable of doing self-catheterization.  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  She came in with psychosis but is noted that patient is not responding to stimuli and her bizarre behaviors is intermittent and subconscious.  She is redirectable and states linear during the conversation.  She was given Valium  as as needed for anxiety Valium  intravaginal to help with the pelvic floor dysfunction.  She was started on Risperdal   1 mg twice daily to help with mood stability/bizarre behaviors.  Patient tolerated medications very well.  She remained linear and was doing her ADLs.  Provider had extensive discussion with patient and patient's husband about somatic illness disorder and the need for patient to follow-up with a good psychiatrist and a strong therapist who can collaborate care between specialities of OB/GYN, neurology and ENT in regards to her medical needs of Mnire's disease, pelvic floor dysfunction, derealization episodes.  Detailed risk assessment is complete based on clinical exam and individual risk factors and acute suicide risk is low and acute violence risk is low.    On the day of discharge, patient denies SI/HI/plan and denies hallucinations.  Patient remains future oriented and is willing to participate in outpatient mental health services.  Currently, all modifiable risk of harm to self/harm to others have been addressed and patient is no longer appropriate for the acute inpatient  setting and is able to continue treatment for mental health needs in the community with the supports as indicated below.  Patient is educated and verbalized understanding of discharge plan of care including medications, follow-up appointments, mental health resources and further crisis services in the community.  He is instructed to call 911 or present to the nearest emergency room should he experience any decompensation in mood, disturbance of bowel or return of suicidal/homicidal ideations.  Patient verbalizes understanding of this education and agrees to this plan of care  Physical Findings: AIMS:  , ,  ,  ,    CIWA:    COWS:      Psychiatric Specialty Exam:  Presentation  General Appearance:  Appropriate for Environment  Eye Contact: Fair  Speech: Clear and Coherent  Speech Volume: Normal    Mood and Affect  Mood: Euthymic  Affect: Appropriate   Thought Process  Thought  Processes: Coherent  Descriptions of Associations:Intact  Orientation:Full (Time, Place and Person)  Thought Content:Logical  Hallucinations:Hallucinations: None  Ideas of Reference:None  Suicidal Thoughts:Suicidal Thoughts: No  Homicidal Thoughts:Homicidal Thoughts: No   Sensorium  Memory: Immediate Fair; Remote Fair  Judgment: Fair  Insight: Fair   Art Therapist  Concentration: Fair  Attention Span: Fair  Recall: Fair  Fund of Knowledge: Fair  Language: Fair   Psychomotor Activity  Psychomotor Activity: Psychomotor Activity: Normal  Musculoskeletal: Strength & Muscle Tone: within normal limits Gait & Station: normal Assets  Assets: Manufacturing Systems Engineer; Resilience; Social Support   Sleep  Sleep: Sleep: Fair    Physical Exam: Physical Exam ROS Blood pressure 121/83, pulse (!) 106, temperature 97.8 F (36.6 C), resp. rate 18, height 5' 3 (1.6 m), weight 72.1 kg, last menstrual period 10/01/2012, SpO2 98%. Body mass index is 28.17 kg/m.   Social History   Tobacco Use  Smoking Status Never  Smokeless Tobacco Never   Tobacco Cessation:  N/A, patient does not currently use tobacco products   Blood Alcohol level:  No results found for: [facility]  Metabolic Disorder Labs:  Lab Results  Component Value Date   HGBA1C 8.4 (H) 03/20/2024   MPG 194 03/20/2024   No results found for: PROLACTIN No results found for: CHOL, TRIG, HDL, CHOLHDL, VLDL, LDLCALC  See Psychiatric Specialty Exam and Suicide Risk Assessment completed by Attending Physician prior to discharge.  Discharge destination:  Home  Is patient on multiple antipsychotic therapies at discharge:  No   Has Patient had three or more failed trials of antipsychotic monotherapy by history:  No  Recommended Plan for Multiple Antipsychotic Therapies: NA  Discharge Instructions     Diet - low sodium heart healthy   Complete by: As directed    Increase  activity slowly   Complete by: As directed       Allergies as of 03/25/2024       Reactions   Apple Juice Swelling, Rash   Mouth and tongue swelling   Ancef [cefazolin]    Metformin And Related Nausea Only   Onglyza [saxagliptin] Other (See Comments)   Change in taste   Tape Itching   takes off skin   Percocet [oxycodone -acetaminophen ] Rash   Sulfa Antibiotics Rash   Vicodin [hydrocodone -acetaminophen ] Rash        Medication List     STOP taking these medications    amoxicillin  500 MG capsule Commonly known as: AMOXIL    triamterene -hydrochlorothiazide  37.5-25 MG capsule Commonly known as: DYAZIDE  Replaced by: triamterene -hydrochlorothiazide  37.5-25 MG tablet   TYLENOL  PO  TAKE these medications      Indication  AZO Urinary Health Pack Take by mouth. As needed  Indication: Weight Loss   Betahistine HCl Powd 24 mg by Does not apply route 2 (two) times daily.  Indication: 21-Hydroxylase Deficiency   calcium  citrate 950 (200 Ca) MG tablet Commonly known as: CALCITRATE - dosed in mg elemental calcium  Take 0.5 tablets (475 mg total) by mouth 2 (two) times daily. What changed: how much to take  Indication: Low Amount of Calcium  in the Blood   Cranberry-D Mannose 500-50 MG Chew Chew 1,000 mg by mouth 2 (two) times daily.  Indication: Weight Loss   diazepam  10 MG tablet Commonly known as: VALIUM  Take 1 tablet (10 mg total) by mouth every 8 (eight) hours as needed for muscle spasms. What changed:  medication strength how to take this  Indication: Muscle Spasm   diazepam  2 MG tablet Commonly known as: VALIUM  Take 1 tablet (2 mg total) by mouth every 8 (eight) hours. What changed:  medication strength how much to take when to take this reasons to take this  Indication: Feeling Anxious   diphenhydrAMINE  25 MG tablet Commonly known as: BENADRYL  Take 12.5 mg by mouth at bedtime as needed for itching or sleep.  Indication: Acute Urticaria    estradiol  0.01 % Crea vaginal cream Commonly known as: ESTRACE  Place 1 Applicatorful vaginally 3 (three) times a week.  Indication: Vulvovaginal Atrophy   Glucosamine Sulfate 750 MG Tabs Take 2 tablets by mouth daily.  Indication: Joint Damage causing Pain and Loss of Function   magnesium  oxide 400 MG tablet Commonly known as: MAG-OX Take 400 mg by mouth daily.  Indication: Acid Indigestion   ondansetron  4 MG disintegrating tablet Commonly known as: Zofran  ODT 4mg  ODT q6 hours prn nausea/vomit  Indication: Nausea and Vomiting   Proctosol HC 2.5 % rectal cream Generic drug: hydrocortisone Place 1 application rectally 2 (two) times daily.  Indication: Itching of Anus and/or Rectum   risperiDONE  1 MG tablet Commonly known as: RISPERDAL  Take 1 tablet (1 mg total) by mouth 2 (two) times daily.  Indication: Major Depressive Disorder   tamsulosin 0.4 MG Caps capsule Commonly known as: FLOMAX Take 1 capsule (0.4 mg total) by mouth daily. Start taking on: March 26, 2024  Indication: Dysfunction of the Urinary Bladder   Tradjenta  5 MG Tabs tablet Generic drug: linagliptin  Take 5 mg by mouth daily.  Indication: Type 2 Diabetes   traMADol  50 MG tablet Commonly known as: ULTRAM  Take 1 tablet (50 mg total) by mouth as needed.  Indication: Pain   triamterene -hydrochlorothiazide  37.5-25 MG tablet Commonly known as: MAXZIDE -25 Take 1 tablet by mouth daily. Start taking on: March 26, 2024 Replaces: triamterene -hydrochlorothiazide  37.5-25 MG capsule  Indication: Edema        Follow-up Information     [facility] Follow up.   Why: Appointment is scheduled for 03/27/2024 at [facility] information: [address], [place], [place] [address]  Office: [phone]. [phone] Fax: [phone] Email: [email or web address]        [facility] Follow up.   Why: In person assessment for psychiatry is 05/02/23 at 10:00 AM with Luke Louder,  MSN, PMHNP. Contact information: [address] [place], [place], [address]  Phone: [phone]                Follow-up recommendations:  Activity:  as tolerated    Signed: Daena Alper, MD 03/25/2024, 11:36 AM

## 2024-03-25 NOTE — Plan of Care (Signed)
  Problem: Education: Goal: Knowledge of [facility] Education information/materials will improve Outcome: Progressing   Problem: Education: Goal: Emotional status will improve Outcome: Progressing   Problem: Education: Goal: Mental status will improve Outcome: Progressing   

## 2024-03-26 ENCOUNTER — Other Ambulatory Visit: Payer: Self-pay

## 2024-03-26 DIAGNOSIS — F22 Delusional disorders: Secondary | ICD-10-CM | POA: Insufficient documentation

## 2024-03-26 LAB — GLUCOSE, CAPILLARY: Glucose-Capillary: 202 mg/dL — ABNORMAL HIGH (ref 70–99)

## 2024-04-04 ENCOUNTER — Other Ambulatory Visit: Payer: Self-pay
